# Patient Record
Sex: Male | Born: 1965 | Race: White | Hispanic: No | Marital: Married | Smoking: Former smoker
Health system: Southern US, Community
[De-identification: ages and names within clinical notes are randomized; demographics above are authoritative.]

## PROBLEM LIST (undated history)

## (undated) DIAGNOSIS — B029 Zoster without complications: Secondary | ICD-10-CM

## (undated) DIAGNOSIS — G4700 Insomnia, unspecified: Secondary | ICD-10-CM

## (undated) DIAGNOSIS — Z9989 Dependence on other enabling machines and devices: Secondary | ICD-10-CM

## (undated) DIAGNOSIS — R5383 Other fatigue: Secondary | ICD-10-CM

## (undated) DIAGNOSIS — G4730 Sleep apnea, unspecified: Secondary | ICD-10-CM

## (undated) DIAGNOSIS — R001 Bradycardia, unspecified: Secondary | ICD-10-CM

## (undated) DIAGNOSIS — G709 Myoneural disorder, unspecified: Secondary | ICD-10-CM

## (undated) DIAGNOSIS — E785 Hyperlipidemia, unspecified: Secondary | ICD-10-CM

## (undated) DIAGNOSIS — D696 Thrombocytopenia, unspecified: Secondary | ICD-10-CM

## (undated) DIAGNOSIS — I10 Essential (primary) hypertension: Secondary | ICD-10-CM

## (undated) HISTORY — DX: Thrombocytopenia, unspecified: D69.6

## (undated) HISTORY — DX: Other fatigue: R53.83

## (undated) HISTORY — DX: Myoneural disorder, unspecified: G70.9

## (undated) HISTORY — DX: Sleep apnea, unspecified: G47.30

## (undated) HISTORY — DX: Dependence on other enabling machines and devices: Z99.89

## (undated) HISTORY — DX: Essential (primary) hypertension: I10

## (undated) HISTORY — DX: Zoster without complications: B02.9

## (undated) HISTORY — DX: Hyperlipidemia, unspecified: E78.5

## (undated) HISTORY — DX: Bradycardia, unspecified: R00.1

## (undated) HISTORY — DX: Insomnia, unspecified: G47.00

---

## 2006-01-08 ENCOUNTER — Ambulatory Visit: Payer: Self-pay | Admitting: Family Medicine

## 2007-04-19 DIAGNOSIS — G4700 Insomnia, unspecified: Secondary | ICD-10-CM

## 2007-04-19 HISTORY — DX: Insomnia, unspecified: G47.00

## 2007-04-22 HISTORY — PX: LUMBAR LAMINECTOMY: SHX95

## 2007-10-08 ENCOUNTER — Ambulatory Visit: Payer: Self-pay | Admitting: Chiropractor

## 2008-04-13 ENCOUNTER — Ambulatory Visit: Payer: Self-pay | Admitting: Neurosurgery

## 2008-04-21 ENCOUNTER — Ambulatory Visit (HOSPITAL_COMMUNITY): Admission: RE | Admit: 2008-04-21 | Discharge: 2008-04-22 | Payer: Self-pay | Admitting: Neurosurgery

## 2008-06-23 DIAGNOSIS — R5383 Other fatigue: Secondary | ICD-10-CM

## 2008-06-23 HISTORY — DX: Other fatigue: R53.83

## 2010-07-15 LAB — BASIC METABOLIC PANEL
Calcium: 9.7 mg/dL (ref 8.4–10.5)
Creatinine, Ser: 1.31 mg/dL (ref 0.4–1.5)
GFR calc Af Amer: >60 mL/min (ref >60)
GFR calc non Af Amer: 59 mL/min — ABNORMAL LOW (ref >60)
Glucose, Bld: 85 mg/dL (ref 70–99)
Sodium: 138 mEq/L (ref 135–145)

## 2010-07-15 LAB — CBC
Hemoglobin: 14.9 g/dL (ref 13.0–17.0)
RBC: 4.87 MIL/uL (ref 4.22–5.81)
RDW: 12.6 % (ref 11.5–15.5)

## 2010-08-13 NOTE — Op Note (Signed)
[name], [name]                ACCOUNT NO.:  [id number]   MEDICAL RECORD NO.:  [id number]          PATIENT TYPE:  OIB   LOCATION:  3526                         FACILITY:  [facility]   PHYSICIAN:  Coletta Memos, M.D.     DATE OF BIRTH:  02-09-1966   DATE OF PROCEDURE:  04/21/2008  DATE OF DISCHARGE:                               OPERATIVE REPORT   PREOPERATIVE DIAGNOSES:  1. Displaced disk, L4-5.  2. Right L4 and right L5 radiculopathy.   POSTOPERATIVE DIAGNOSES:  1. Displaced disk, L4-5.  2. Right L4 and right L5 radiculopathy.   PROCEDURE:  Right L4-5 semi-hemilaminectomy and diskectomy with  microdissection.   COMPLICATIONS:  None.   SURGEON:  Coletta Memos, MD   ASSISTANT:  Hewitt Shorts, MD   INDICATIONS:  Clifford Alexander is a 45 year old who has severe pain in his  back, right lower extremity secondary to what is a very large herniated  disk at L4-5.  Slightly centered to the right side, but it does cross  the midline.  After living with this for approximately 6 months, he is  at this point in time opted for operative decompression.  He is admitted  today for said operation.   OPERATIVE NOTE:  Mr. [name] was brought to the operating room intubated  and placed under general anesthetic without difficulty.  He was rolled  prone onto a Wilson frame and all pressure points were properly padded.  His back was prepped and he was draped in sterile fashion.  I  infiltrated 20 mL of 0.5% lidocaine, 1:200,000 strength epinephrine into  the lumbar region.  I opened the skin with a #10 blade and took the  incision down to the thoracolumbar fascia.  I then exposed the lamina,  which I believed to be L4 and L5.  Intraoperative x-ray confirmed that I  was in a correct position.  I then performed the semi-hemilaminectomy at  L4 using a high-speed drill and Kerrison punch.  I removed the  ligamentum flavum and exposed the thecal sac.  I brought the microscope  into the operative field  and proceeded with the diskectomy.   With microdissection and Dr. Earl Gala assistance, I retracted thecal  sac medially.  There was a very large disk herniation, which was caudal  to the disk space and then a very large bridge and opening into the disk  space itself.  I removed the fragment first, then entered the disk space  where the disk was markedly degenerated and came out very easily.  I  tried to remove as much of the loose material as possible.  I then  irrigated copiously into the disk space to make sure again and all loose  material will be identified.  I then irrigated the wound.  I then closed  the wound in  layered fashion after inspecting the nerve root and feeling that there  was good decompression rostrally, caudally, medially, and laterally.  I  closed the wound in layered fashion using Vicryl sutures to  reapproximate thoracolumbar, subcutaneous, and subcuticular layers.  I  used Dermabond for sterile dressing.  ______________________________  Coletta Memos, M.D.     KC/MEDQ  D:  04/21/2008  T:  04/22/2008  Job:  [id number]

## 2010-12-16 ENCOUNTER — Ambulatory Visit: Payer: Self-pay | Admitting: Gastroenterology

## 2010-12-19 LAB — HM COLONOSCOPY

## 2014-03-14 LAB — LIPID PANEL
CHOLESTEROL: 232 mg/dL — AB (ref 0–200)
HDL: 43 mg/dL (ref 35–70)
LDL Cholesterol: 158 mg/dL
Triglycerides: 155 mg/dL (ref 40–160)

## 2014-03-14 LAB — HEMOGLOBIN A1C: Hgb A1c MFr Bld: 5.4 % (ref 4.0–6.0)

## 2014-03-14 LAB — BASIC METABOLIC PANEL: GLUCOSE: 99 mg/dL

## 2014-09-11 ENCOUNTER — Ambulatory Visit: Payer: Self-pay | Admitting: Family Medicine

## 2014-09-16 ENCOUNTER — Ambulatory Visit: Payer: Managed Care, Other (non HMO)

## 2014-09-16 ENCOUNTER — Ambulatory Visit
Admission: EM | Admit: 2014-09-16 | Discharge: 2014-09-16 | Disposition: A | Discharge status: Home / Self Care | Payer: Managed Care, Other (non HMO) | Attending: Internal Medicine | Admitting: Internal Medicine

## 2014-09-16 ENCOUNTER — Encounter: Payer: Self-pay | Admitting: Gynecology

## 2014-09-16 DIAGNOSIS — M25462 Effusion, left knee: Secondary | ICD-10-CM | POA: Insufficient documentation

## 2014-09-16 DIAGNOSIS — I10 Essential (primary) hypertension: Secondary | ICD-10-CM | POA: Insufficient documentation

## 2014-09-16 DIAGNOSIS — L0390 Cellulitis, unspecified: Secondary | ICD-10-CM | POA: Diagnosis not present

## 2014-09-16 DIAGNOSIS — R5383 Other fatigue: Secondary | ICD-10-CM | POA: Diagnosis not present

## 2014-09-16 DIAGNOSIS — L0291 Cutaneous abscess, unspecified: Secondary | ICD-10-CM

## 2014-09-16 DIAGNOSIS — Z87891 Personal history of nicotine dependence: Secondary | ICD-10-CM | POA: Diagnosis not present

## 2014-09-16 DIAGNOSIS — E785 Hyperlipidemia, unspecified: Secondary | ICD-10-CM | POA: Insufficient documentation

## 2014-09-16 DIAGNOSIS — Z79899 Other long term (current) drug therapy: Secondary | ICD-10-CM | POA: Diagnosis not present

## 2014-09-16 DIAGNOSIS — M009 Pyogenic arthritis, unspecified: Secondary | ICD-10-CM | POA: Diagnosis not present

## 2014-09-16 DIAGNOSIS — G4700 Insomnia, unspecified: Secondary | ICD-10-CM | POA: Diagnosis not present

## 2014-09-16 MED ORDER — SULFAMETHOXAZOLE-TRIMETHOPRIM 800-160 MG PO TABS: 1.0000 | ORAL_TABLET | Freq: Two times a day (BID) | ORAL | Status: AC

## 2014-09-16 MED ORDER — MUPIROCIN CALCIUM 2 % EX CREA: 1.0000 "application " | TOPICAL_CREAM | Freq: Three times a day (TID) | CUTANEOUS | Status: DC

## 2014-09-16 NOTE — ED Notes (Signed)
Patient c/o left knee swollen x week ago. Discomfort / bump at knee .

## 2014-09-16 NOTE — ED Provider Notes (Signed)
CSN: [id number]     Arrival date & time 09/16/14  1434 History   First MD Initiated Contact with Patient 09/16/14 1637     Chief Complaint  Patient presents with  . 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 Noted a tender spot on point of left knee about a week ago and considered a bug bite. Has become more swollen and painful- tried to squeeze it multiple to me without any fluid release-now increasing tender. No fever . No malaise Past Medical History  Diagnosis Date  . Hypertension   . Hyperlipidemia   . Insomnia 04/19/2007  . Fatigue 06/23/2008   Past Surgical History  Procedure Laterality Date  . Lumbar laminectomy  04/22/2007    L4-L5   discectomy 03/2008   Family History  Problem Relation Age of Onset  . Hyperthyroidism Mother    History  Substance Use Topics  . Smoking status: Former Smoker -- 1.00 packs/day for 10 years    Quit date: 04/18/1992  . Smokeless tobacco: Not on file  . Alcohol Use: No    Review of Systems  Review of 10 systems negative for acute change except as referenced in HPI  Allergies  Ciprofloxacin  Home Medications   Prior to Admission medications   Medication Sig Start Date End Date Taking? Authorizing Provider  lisinopril-hydrochlorothiazide (PRINZIDE,ZESTORETIC) 20-12.5 MG per tablet Take 1 tablet by mouth daily.   Yes Historical Provider, MD  omega-3 acid ethyl esters (LOVAZA) 1 G capsule Take by mouth 2 (two) times daily.   Yes Historical Provider, MD  mupirocin cream (BACTROBAN) 2 % Apply 1 application topically 3 (three) times daily. 09/16/14   Jan Fireman, PA-C  omeprazole (PRILOSEC) 20 MG capsule Take 20 mg by mouth daily.    Historical Provider, MD  sulfamethoxazole-trimethoprim (BACTRIM DS,SEPTRA DS) 800-160 MG per tablet Take 1 tablet by mouth 2 (two) times daily. 09/16/14 09/26/14  Jan Fireman,  PA-C   BP 114/84 mmHg  Pulse 78  Temp(Src) 98.3 F (36.8 C) (Tympanic)  Ht 6\' 3"  (1.905 m)  Wt 263 lb (119.296 kg)  BMI 32.87 kg/m2  SpO2 99% Physical Exam Constitutional -alert and oriented,well appearing , mild distress: knee pain-skin wound. Afebrile Head-atraumatic Eyes- conjunctiva normal, EOMI ,conjugate gaze Nose- no congestion  Mouth/throat- mucous membranes moist , Neck- supple  CV- regular rate, grossly normal heart sounds,  Resp-no distress, normal respiratory effort GI- ,no distention GU- not examined MSK- left leg with inflammation point of left knee, 2-3 cm central induration and small central wound. Minor drainage reported by patient. 10 cm halo of erythema. Warm to touch . No effusion of joint, Good ROM without pain except skin tenderness. No lower leg findings. Right knee and lower leg WNL No lower extremity tenderness nor edema,no joint effusion, ambulatory Neuro- normal speech and language,  Skin-WNL except HPI as reported Psych-mood and affect grossly normal; speech and behavior grossly normal ED Course  Procedures (including critical care time) Labs Review Labs Reviewed - No data to display  Imaging Review Dg Knee Complete 4 Views Left  09/16/2014   CLINICAL DATA:  One week history of knee swelling.  EXAM: LEFT KNEE - COMPLETE 4+ VIEW  COMPARISON:  None.  FINDINGS: Frontal, lateral, bilateral oblique, and sunrise patellar images obtained. There is no fracture or dislocation. No appreciable joint effusion. There is slight patellofemoral joint space narrowing. Other joint spaces appear normal.  No erosive change.  IMPRESSION: Slight patellofemoral joint space narrowing. No fracture or effusion.   Electronically Signed   By: Lowella Grip III M.D.   On: 09/16/2014 17:03   No evidence of foreign body noted  MDM   1. Abscess and cellulitis     Rx for TMP-SMZ BID x 10 days and Mupirocin was e-scribed--will not drop in system. 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 x-ray results and diagnosis reviewed with patient 2. Rx as per orders; risks, benefits, potential side effects reviewed with patient 3. Recommend supportive treatment with tylenol/ibuprofen; sterile dressings 4. F/u prn if symptoms worsen or don't improve- see for I&D after initiation of antibiotics   Jan Fireman, PA-C 09/17/14 2326

## 2014-09-17 ENCOUNTER — Encounter: Payer: Self-pay | Admitting: Physician Assistant

## 2014-09-19 ENCOUNTER — Ambulatory Visit
Admission: EM | Admit: 2014-09-19 | Discharge: 2014-09-19 | Disposition: A | Discharge status: Home / Self Care | Payer: Managed Care, Other (non HMO) | Attending: Family Medicine | Admitting: Family Medicine

## 2014-09-19 DIAGNOSIS — L03116 Cellulitis of left lower limb: Secondary | ICD-10-CM

## 2014-09-19 MED ORDER — CEFAZOLIN SODIUM 1 G IJ SOLR
1.0000 g | Freq: Once | INTRAMUSCULAR | Status: AC
  Administered 2014-09-19: 1 g via INTRAMUSCULAR

## 2014-09-19 NOTE — ED Provider Notes (Signed)
CSN: [id number]     Arrival date & time 09/19/14  1011 History   First MD Initiated Contact with Patient 09/19/14 1129     Chief Complaint  Patient presents with  . Wound Check   (Consider location/radiation/quality/duration/timing/severity/associated sxs/prior Treatment) HPI Comments: 49 yo male here for follow up on his left anterior knee cellulitis. Was seen here (by different provider) 3 days ago and started on Bactrim DS. Has been applying heating pad as well. States he's doing better and symptoms improving. Denies fevers, chills.  Patient is a 49 y.o. male presenting with wound check. The history is provided by the patient.  Wound Check    Past Medical History  Diagnosis Date  . Hypertension   . Hyperlipidemia   . Insomnia 04/19/2007  . Fatigue 06/23/2008   Past Surgical History  Procedure Laterality Date  . Lumbar laminectomy  04/22/2007    L4-L5   discectomy 03/2008   Family History  Problem Relation Age of Onset  . Hyperthyroidism Mother    History  Substance Use Topics  . Smoking status: Former Smoker -- 1.00 packs/day for 10 years    Quit date: 04/18/1992  . Smokeless tobacco: Not on file  . Alcohol Use: No    Review of Systems  Allergies  Ciprofloxacin  Home Medications   Prior to Admission medications   Medication Sig Start Date End Date Taking? Authorizing Provider  lisinopril-hydrochlorothiazide (PRINZIDE,ZESTORETIC) 20-12.5 MG per tablet Take 1 tablet by mouth daily.    Historical Provider, MD  mupirocin cream (BACTROBAN) 2 % Apply 1 application topically 3 (three) times daily. 09/16/14   Jan Fireman, PA-C  omega-3 acid ethyl esters (LOVAZA) 1 G capsule Take by mouth 2 (two) times daily.    Historical Provider, MD  omeprazole (PRILOSEC) 20 MG capsule Take 20 mg by mouth daily.    Historical Provider, MD  sulfamethoxazole-trimethoprim (BACTRIM DS,SEPTRA DS) 800-160 MG per tablet Take 1 tablet by mouth 2 (two) times daily. 09/16/14 09/26/14  Jan Fireman, PA-C   Temp(Src) 97.3 F (36.3 C) (Tympanic) Physical Exam  Constitutional: He appears well-developed and well-nourished. No distress.  Neurological: He is alert.  Skin: He is not diaphoretic.  Left anterior knee skin with healing puncture wound area and mild surrounding erythema and edema (per patient report this is improved); left lower extremity neurovascularly intact; normal ROM; no drainage  Nursing note and vitals reviewed.   ED Course  Procedures (including critical care time) Labs Review Labs Reviewed - No data to display  Imaging Review No results found.   MDM   1. Cellulitis of knee, left   (improving)  Discharge Medication List as of 09/19/2014 12:30 PM    Plan: 1.  diagnosis reviewed with patient 2. Recommend continue current oral antibiotic 3. Patient given Ancef 1gm IM x 1 in clinic 4. Recommend warm compresses to affected area and elevation  5. F/u prn if symptoms worsen or don't improve    Norval Gable, MD 09/19/14 [phone]

## 2014-09-19 NOTE — ED Notes (Signed)
Here for wound recheck.

## 2014-10-23 ENCOUNTER — Encounter: Payer: Self-pay | Admitting: Family Medicine

## 2014-10-23 ENCOUNTER — Ambulatory Visit (INDEPENDENT_AMBULATORY_CARE_PROVIDER_SITE_OTHER): Payer: Managed Care, Other (non HMO) | Admitting: Family Medicine

## 2014-10-23 VITALS — BP 124/78 | HR 69 | Temp 98.2°F | Resp 16 | Ht 76.5 in | Wt 264.6 lb

## 2014-10-23 DIAGNOSIS — E785 Hyperlipidemia, unspecified: Secondary | ICD-10-CM

## 2014-10-23 DIAGNOSIS — E782 Mixed hyperlipidemia: Secondary | ICD-10-CM | POA: Diagnosis not present

## 2014-10-23 DIAGNOSIS — K219 Gastro-esophageal reflux disease without esophagitis: Secondary | ICD-10-CM | POA: Insufficient documentation

## 2014-10-23 DIAGNOSIS — I10 Essential (primary) hypertension: Secondary | ICD-10-CM

## 2014-10-23 DIAGNOSIS — K645 Perianal venous thrombosis: Secondary | ICD-10-CM

## 2014-10-23 DIAGNOSIS — R519 Headache, unspecified: Secondary | ICD-10-CM | POA: Insufficient documentation

## 2014-10-23 DIAGNOSIS — R739 Hyperglycemia, unspecified: Secondary | ICD-10-CM | POA: Insufficient documentation

## 2014-10-23 DIAGNOSIS — R51 Headache: Secondary | ICD-10-CM

## 2014-10-23 DIAGNOSIS — E8881 Metabolic syndrome: Secondary | ICD-10-CM | POA: Insufficient documentation

## 2014-10-23 DIAGNOSIS — Z8709 Personal history of other diseases of the respiratory system: Secondary | ICD-10-CM | POA: Insufficient documentation

## 2014-10-23 LAB — GLUCOSE, POCT (MANUAL RESULT ENTRY): POC GLUCOSE: 108 mg/dL — AB (ref 70–99)

## 2014-10-23 LAB — POCT GLYCOSYLATED HEMOGLOBIN (HGB A1C): HEMOGLOBIN A1C: 5.2

## 2014-10-23 NOTE — Progress Notes (Signed)
Name: Clifford Alexander   MRN: [id number]    DOB: 14-Oct-1965   Date:10/23/2014       Progress Note  Subjective  Chief Complaint  Chief Complaint  Patient presents with  . Hyperlipidemia  . Hypertension    Hyperlipidemia This is a chronic problem. The current episode started more than 1 year ago. The problem is uncontrolled. Recent lipid tests were reviewed and are high. Exacerbating diseases include obesity. Factors aggravating his hyperlipidemia include thiazides. Pertinent negatives include no chest pain, focal weakness, myalgias or shortness of breath. He is currently on no antihyperlipidemic treatment. The current treatment provides no improvement of lipids. Compliance problems include adherence to diet and adherence to exercise.  Risk factors for coronary artery disease include dyslipidemia, hypertension, male sex, obesity, a sedentary lifestyle and stress.  Hypertension This is a chronic problem. The current episode started more than 1 year ago. The problem is unchanged. The problem is controlled. Pertinent negatives include no blurred vision, chest pain, headaches, neck pain, orthopnea, palpitations or shortness of breath. There are no associated agents to hypertension. Past treatments include ACE inhibitors and diuretics. The current treatment provides moderate improvement. There are no compliance problems.       Past Medical History  Diagnosis Date  . Hypertension   . Hyperlipidemia   . Insomnia 04/19/2007  . Fatigue 06/23/2008    History  Substance Use Topics  . Smoking status: Former Smoker -- 1.00 packs/day for 10 years    Quit date: 04/18/1992  . Smokeless tobacco: Not on file  . Alcohol Use: No     Current outpatient prescriptions:  .  lisinopril-hydrochlorothiazide (PRINZIDE,ZESTORETIC) 20-12.5 MG per tablet, Take 1 tablet by mouth daily., Disp: , Rfl:  .  omega-3 acid ethyl esters (LOVAZA) 1 G capsule, Take by mouth 2 (two) times daily., Disp: , Rfl:  .  omeprazole  (PRILOSEC) 20 MG capsule, Take 20 mg by mouth daily., Disp: , Rfl:   Allergies  Allergen Reactions  . Ciprofloxacin     Review of Systems  Constitutional: Negative for fever, chills and weight loss.  HENT: Negative for congestion, hearing loss, sore throat and tinnitus.   Eyes: Negative for blurred vision, double vision and redness.  Respiratory: Negative for cough, hemoptysis and shortness of breath.   Cardiovascular: Negative for chest pain, palpitations, orthopnea, claudication and leg swelling.  Gastrointestinal: Negative for heartburn, nausea, vomiting, diarrhea, constipation and blood in stool.  Genitourinary: Negative for dysuria, urgency, frequency and hematuria.  Musculoskeletal: Negative for myalgias, back pain, joint pain, falls and neck pain.  Skin: Negative for itching.  Neurological: Negative for dizziness, tingling, tremors, focal weakness, seizures, loss of consciousness, weakness and headaches.  Endo/Heme/Allergies: Does not bruise/bleed easily.  Psychiatric/Behavioral: Negative for depression and substance abuse. The patient is not nervous/anxious and does not have insomnia.      Objective  Filed Vitals:   10/23/14 0759  BP: 124/78  Pulse: 69  Temp: 98.2 F (36.8 C)  Resp: 16  Height: 6' 4.5" (1.943 m)  Weight: 264 lb 9 oz (120.005 kg)  SpO2: 98%     Physical Exam  Constitutional: He is oriented to person, place, and time and well-developed, well-nourished, and in no distress.   obese  HENT:  Head: Normocephalic.  Eyes: EOM are normal. Pupils are equal, round, and reactive to light.  Neck: Normal range of motion. Neck supple. No thyromegaly present.  Cardiovascular: Normal rate, regular rhythm and normal heart sounds.   No murmur heard. Pulmonary/Chest:  Effort normal and breath sounds normal. No respiratory distress. He has no wheezes.  Abdominal: Soft. Bowel sounds are normal.  Musculoskeletal: Normal range of motion. He exhibits edema (trace  edema).  Lymphadenopathy:    He has no cervical adenopathy.  Neurological: He is alert and oriented to person, place, and time. No cranial nerve deficit. Gait normal. Coordination normal.  Skin: Skin is warm and dry. No rash noted.  Psychiatric: Affect and judgment normal.      Assessment & Plan   1. Benign essential HTN Well-controlled  2. External thrombosed hemorrhoids Continue suppositories and creams and sit on a doughnut  3. Combined fat and carbohydrate induced hyperlipemia Given obesity handout check labs today and given a handout on obesity  4. Hyperglycemia  - POCT glycosylated hemoglobin (Hb A1C) - POCT glucose (manual entry)  5. Hyperlipidemia Labs today - Comprehensive metabolic panel - Lipid panel - TSH

## 2014-10-23 NOTE — Patient Instructions (Addendum)

## 2014-10-24 LAB — COMPREHENSIVE METABOLIC PANEL
ALBUMIN: 4.3 g/dL (ref 3.5–5.5)
ALK PHOS: 63 IU/L (ref 39–117)
ALT: 17 IU/L (ref 0–44)
AST: 16 IU/L (ref 0–40)
Albumin/Globulin Ratio: 2 (ref 1.1–2.5)
BILIRUBIN TOTAL: 1 mg/dL (ref 0.0–1.2)
BUN / CREAT RATIO: 10 (ref 9–20)
BUN: 12 mg/dL (ref 6–24)
CHLORIDE: 102 mmol/L (ref 97–108)
CO2: 24 mmol/L (ref 18–29)
CREATININE: 1.25 mg/dL (ref 0.76–1.27)
Calcium: 9.3 mg/dL (ref 8.7–10.2)
GFR calc non Af Amer: 67 mL/min/{1.73_m2} (ref >59)
GFR, EST AFRICAN AMERICAN: 77 mL/min/{1.73_m2} (ref >59)
Globulin, Total: 2.1 g/dL (ref 1.5–4.5)
Glucose: 93 mg/dL (ref 65–99)
POTASSIUM: 4.3 mmol/L (ref 3.5–5.2)
SODIUM: 142 mmol/L (ref 134–144)
TOTAL PROTEIN: 6.4 g/dL (ref 6.0–8.5)

## 2014-10-24 LAB — LIPID PANEL
CHOL/HDL RATIO: 4.7 ratio (ref 0.0–5.0)
Cholesterol, Total: 191 mg/dL (ref 100–199)
HDL: 41 mg/dL (ref >39)
LDL CALC: 126 mg/dL — AB (ref 0–99)
TRIGLYCERIDES: 121 mg/dL (ref 0–149)
VLDL Cholesterol Cal: 24 mg/dL (ref 5–40)

## 2014-10-24 LAB — TSH: TSH: 3.26 u[IU]/mL (ref 0.450–4.500)

## 2015-01-22 ENCOUNTER — Encounter: Payer: Self-pay | Admitting: Family Medicine

## 2015-01-22 ENCOUNTER — Ambulatory Visit (INDEPENDENT_AMBULATORY_CARE_PROVIDER_SITE_OTHER): Payer: Managed Care, Other (non HMO) | Admitting: Family Medicine

## 2015-01-22 VITALS — BP 130/80 | HR 67 | Temp 98.6°F | Resp 18 | Ht 77.0 in | Wt 269.2 lb

## 2015-01-22 DIAGNOSIS — I10 Essential (primary) hypertension: Secondary | ICD-10-CM | POA: Diagnosis not present

## 2015-01-22 DIAGNOSIS — J0101 Acute recurrent maxillary sinusitis: Secondary | ICD-10-CM | POA: Diagnosis not present

## 2015-01-22 DIAGNOSIS — J40 Bronchitis, not specified as acute or chronic: Secondary | ICD-10-CM

## 2015-01-22 MED ORDER — AMOXICILLIN-POT CLAVULANATE 875-125 MG PO TABS: 1.0000 | ORAL_TABLET | Freq: Two times a day (BID) | ORAL | Status: DC

## 2015-01-22 MED ORDER — LISINOPRIL-HYDROCHLOROTHIAZIDE 20-12.5 MG PO TABS: 1.0000 | ORAL_TABLET | Freq: Every day | ORAL | Status: DC

## 2015-01-22 MED ORDER — FLUTICASONE PROPIONATE 50 MCG/ACT NA SUSP: 2.0000 | Freq: Every day | NASAL | Status: DC

## 2015-01-22 MED ORDER — PREDNISONE 20 MG PO TABS: 20.0000 mg | ORAL_TABLET | Freq: Every day | ORAL | Status: DC

## 2015-01-22 NOTE — Progress Notes (Signed)
Name: Clifford Alexander   MRN: [id number]    DOB: [birth year]   Date:01/22/2015       Progress Note  Subjective  Chief Complaint  Chief Complaint  Patient presents with  . Hypertension    3 month follow up  . Hyperlipidemia  . URI    for 6 days    HPI  Hypertension   Patient presents for follow-up of hypertension. It has been present for over 5 years.  Patient states that there is compliance with medical regimen which consists of lisinopril 20 mg once daily . There is no end organ disease. Cardiac risk factors include hypertension hyperlipidemia and diabetes.  Exercise regimen consist of minimal .  Diet consist of some salt restriction .  Hyperlipidemia  Patient has a history of hyperlipidemia for over 5 years.  Current medical regimen consist of omega-3 fatty acids .  Compliance is good .  Diet and exercise are currently followed intermittently .  Risk factors for cardiovascular disease include hyperlipidemia obesity and sedentary lifestyle .   There have been no side effects from the medication.    Sinusitis  Patient presents with greater than 7 day history of nasal congestion and drainage which is purulent in color. There is tenderness over the sinuses. There has been fever to none along with some associated chills on occasion. Usage of over-the-counter medications is not been affected. There is also accompanying cough productive of purulent sputum.  Past Medical History  Diagnosis Date  . Hypertension   . Hyperlipidemia   . Insomnia 04/19/2007  . Fatigue 06/23/2008    Social History  Substance Use Topics  . Smoking status: Former Smoker -- 1.00 packs/day for 10 years    Quit date: 04/18/1992  . Smokeless tobacco: Not on file  . Alcohol Use: No     Current outpatient prescriptions:  .  amoxicillin-clavulanate (AUGMENTIN) 875-125 MG tablet, Take 1 tablet by mouth 2 (two) times daily., Disp: 20 tablet, Rfl: 0 .  fluticasone (FLONASE) 50 MCG/ACT nasal spray, Place 2 sprays  into both nostrils daily., Disp: 16 g, Rfl: 1 .  lisinopril-hydrochlorothiazide (PRINZIDE,ZESTORETIC) 20-12.5 MG tablet, Take 1 tablet by mouth daily., Disp: 30 tablet, Rfl: 5 .  omega-3 acid ethyl esters (LOVAZA) 1 G capsule, Take by mouth 2 (two) times daily., Disp: , Rfl:  .  omeprazole (PRILOSEC) 20 MG capsule, Take 20 mg by mouth daily., Disp: , Rfl:  .  predniSONE (DELTASONE) 20 MG tablet, Take 1 tablet (20 mg total) by mouth daily with breakfast., Disp: 10 tablet, Rfl: 0  Allergies  Allergen Reactions  . Ciprofloxacin     Review of Systems  Constitutional: Positive for malaise/fatigue. Negative for fever, chills and weight loss.  HENT: Positive for congestion (PURULENT NASAL DISCHARGE AND SINUS PRESSURE). Negative for hearing loss, sore throat and tinnitus.   Eyes: Negative for blurred vision, double vision and redness.  Respiratory: Positive for cough and sputum production. Negative for hemoptysis and shortness of breath.   Cardiovascular: Negative for chest pain, palpitations, orthopnea, claudication and leg swelling.  Gastrointestinal: Negative for heartburn, nausea, vomiting, diarrhea, constipation and blood in stool.  Genitourinary: Negative for dysuria, urgency, frequency and hematuria.  Musculoskeletal: Negative for myalgias, back pain, joint pain, falls and neck pain.  Skin: Negative for itching.  Neurological: Negative for dizziness, tingling, tremors, focal weakness, seizures, loss of consciousness, weakness and headaches.  Endo/Heme/Allergies: Does not bruise/bleed easily.  Psychiatric/Behavioral: Negative for depression and substance abuse. The patient is not nervous/anxious and  does not have insomnia.      Objective  Filed Vitals:   01/22/15 1143  BP: 130/80  Pulse: 67  Temp: 98.6 F (37 C)  Resp: 18  Height: 6\' 5"  (1.956 m)  Weight: 269 lb 3.2 oz (122.108 kg)  SpO2: 97%     Physical Exam  Constitutional: He is oriented to person, place, and time.   Obese and in no acute distress  HENT:  Bilateral nasal turbinate swelling with hyperemia and clear to purulent discharge  Eyes: EOM are normal. Pupils are equal, round, and reactive to light.  Neck: Normal range of motion. Neck supple. No thyromegaly present.  Cardiovascular: Normal rate, regular rhythm and normal heart sounds.   No murmur heard. Pulmonary/Chest: Effort normal and breath sounds normal. No respiratory distress. He has no wheezes.  Musculoskeletal: Normal range of motion. He exhibits no edema.  Lymphadenopathy:    He has no cervical adenopathy.  Neurological: He is alert and oriented to person, place, and time. No cranial nerve deficit. Gait normal. Coordination normal.  Skin: Skin is warm and dry. No rash noted.  Psychiatric: Affect and judgment normal.      Assessment & Plan  1. Essential hypertension Well-controlled - Comprehensive Metabolic Panel (CMET)  2. Acute recurrent maxillary sinusitis Augmentin and Flonase OTC  3. Bronchitis Prednisone 20 mg twice a day for 5 days

## 2015-01-23 LAB — COMPREHENSIVE METABOLIC PANEL
ALBUMIN: 4.4 g/dL (ref 3.5–5.5)
ALK PHOS: 73 IU/L (ref 39–117)
ALT: 14 IU/L (ref 0–44)
AST: 15 IU/L (ref 0–40)
Albumin/Globulin Ratio: 1.9 (ref 1.1–2.5)
BUN / CREAT RATIO: 9 (ref 9–20)
BUN: 10 mg/dL (ref 6–24)
Bilirubin Total: 0.8 mg/dL (ref 0.0–1.2)
CO2: 26 mmol/L (ref 18–29)
CREATININE: 1.15 mg/dL (ref 0.76–1.27)
Calcium: 9.6 mg/dL (ref 8.7–10.2)
Chloride: 100 mmol/L (ref 97–106)
GFR, EST AFRICAN AMERICAN: 85 mL/min/{1.73_m2} (ref >59)
GFR, EST NON AFRICAN AMERICAN: 73 mL/min/{1.73_m2} (ref >59)
GLUCOSE: 93 mg/dL (ref 65–99)
Globulin, Total: 2.3 g/dL (ref 1.5–4.5)
Potassium: 4.3 mmol/L (ref 3.5–5.2)
SODIUM: 141 mmol/L (ref 136–144)
TOTAL PROTEIN: 6.7 g/dL (ref 6.0–8.5)

## 2015-01-24 ENCOUNTER — Telehealth: Payer: Self-pay | Admitting: Emergency Medicine

## 2015-01-24 NOTE — Telephone Encounter (Signed)
Left message of lab results

## 2015-05-21 ENCOUNTER — Ambulatory Visit: Payer: Managed Care, Other (non HMO) | Admitting: Family Medicine

## 2015-07-18 ENCOUNTER — Ambulatory Visit (INDEPENDENT_AMBULATORY_CARE_PROVIDER_SITE_OTHER): Payer: Managed Care, Other (non HMO) | Admitting: Family Medicine

## 2015-07-18 VITALS — BP 132/70 | HR 77 | Temp 98.8°F | Resp 18 | Ht 77.0 in | Wt 267.1 lb

## 2015-07-18 DIAGNOSIS — J0101 Acute recurrent maxillary sinusitis: Secondary | ICD-10-CM | POA: Diagnosis not present

## 2015-07-18 DIAGNOSIS — I10 Essential (primary) hypertension: Secondary | ICD-10-CM

## 2015-07-18 DIAGNOSIS — M545 Low back pain, unspecified: Secondary | ICD-10-CM

## 2015-07-18 DIAGNOSIS — J0100 Acute maxillary sinusitis, unspecified: Secondary | ICD-10-CM | POA: Insufficient documentation

## 2015-07-18 MED ORDER — LISINOPRIL-HYDROCHLOROTHIAZIDE 20-12.5 MG PO TABS: 1.0000 | ORAL_TABLET | Freq: Every day | ORAL | Status: DC

## 2015-07-18 MED ORDER — TRAMADOL HCL 50 MG PO TABS: 50.0000 mg | ORAL_TABLET | Freq: Two times a day (BID) | ORAL | Status: DC | PRN

## 2015-07-18 MED ORDER — MOMETASONE FUROATE 50 MCG/ACT NA SUSP: 2.0000 | Freq: Every day | NASAL | Status: DC

## 2015-07-18 MED ORDER — AZITHROMYCIN 250 MG PO TABS: ORAL_TABLET | ORAL | Status: DC

## 2015-07-18 NOTE — Progress Notes (Signed)
Name: Clifford Alexander   MRN: [id number]    DOB: Jan 15, 1966   Date:07/18/2015       Progress Note  Subjective  Chief Complaint  Chief Complaint  Patient presents with  . Pain    lower back and medication refills    Hypertension This is a chronic problem. The problem is unchanged. Pertinent negatives include no blurred vision, chest pain, headaches, palpitations or shortness of breath. Past treatments include diuretics and ACE inhibitors. There is no history of kidney disease, CAD/MI or CVA.  Back Pain This is a new problem. The current episode started more than 1 month ago. The pain is present in the lumbar spine. The pain does not radiate. The pain is at a severity of 3/10. The pain is moderate. 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 Pertinent negatives include no bladder incontinence, bowel incontinence, chest pain, fever, headaches, numbness, pelvic pain or perianal numbness. He has tried nothing for the symptoms.  Sinusitis This is a recurrent problem. The current episode started more than 1 month ago (Intermittent but recurrent, present for 2-3 months, worse in last 2 weeks.). There has been no fever. Associated symptoms include sinus pressure. Pertinent negatives include no chills, headaches, shortness of breath, sore throat or swollen glands. Treatments tried: Flonase, but ran out of it.      Past Medical History  Diagnosis Date  . Hypertension   . Hyperlipidemia   . Insomnia 04/19/2007  . Fatigue 06/23/2008    Past Surgical History  Procedure Laterality Date  . Lumbar laminectomy  04/22/2007    L4-L5   discectomy 03/2008    Family History  Problem Relation Age of Onset  . Hyperthyroidism Mother     Social History   Social History  . Marital Status: Married    Spouse Name: N/A  . Number of Children: N/A  . Years of Education: N/A   Occupational History  . Not  on file.   Social History Main Topics  . Smoking status: Former Smoker -- 1.00 packs/day for 10 years    Quit date: 04/18/1992  . Smokeless tobacco: Not on file  . Alcohol Use: No  . Drug Use: No  . Sexual Activity: Not on file   Other Topics Concern  . Not on file   Social History Narrative     Current outpatient prescriptions:  .  fluticasone (FLONASE) 50 MCG/ACT nasal spray, Place 2 sprays into both nostrils daily., Disp: 16 g, Rfl: 1 .  lisinopril-hydrochlorothiazide (PRINZIDE,ZESTORETIC) 20-12.5 MG tablet, Take 1 tablet by mouth daily., Disp: 30 tablet, Rfl: 5 .  omega-3 acid ethyl esters (LOVAZA) 1 G capsule, Take by mouth 2 (two) times daily., Disp: , Rfl:   Allergies  Allergen Reactions  . Naproxen Shortness Of Breath  . Ciprofloxacin      Review of Systems  Constitutional: Negative for fever and chills.  HENT: Positive for sinus pressure. Negative for sore throat.   Eyes: Negative for blurred vision.  Respiratory: Negative for shortness of breath.   Cardiovascular: Negative for chest pain and palpitations.  Gastrointestinal: Negative for bowel incontinence.  Genitourinary: Negative for bladder incontinence and pelvic pain.  Musculoskeletal: Positive for back pain.  Neurological: Positive for dizziness. Negative for numbness and headaches.    Objective  Filed Vitals:   07/18/15 1600  BP: 132/70  Pulse: 77  Temp: 98.8 F (37.1 C)  Resp:  18  Height: 6\' 5"  (1.956 m)  Weight: 267 lb 2 oz (121.167 kg)  SpO2: 97%    Physical Exam  Constitutional: He is oriented to person, place, and time and well-developed, well-nourished, and in no distress.  HENT:  Head: Normocephalic and atraumatic.  Nose: Right sinus exhibits maxillary sinus tenderness. Left sinus exhibits maxillary sinus tenderness.  Mouth/Throat: Posterior oropharyngeal erythema present.  Nasal turbinate hypertrophy, inflammation  Cardiovascular: Normal rate and regular rhythm.   Pulmonary/Chest:  Effort normal and breath sounds normal.  Abdominal: Soft. Bowel sounds are normal.  Neurological: He is alert and oriented to person, place, and time.  Nursing note and vitals reviewed.    Assessment & Plan  1. Benign essential HTN Blood pressure stable and controlled on present therapy - lisinopril-hydrochlorothiazide (PRINZIDE,ZESTORETIC) 20-12.5 MG tablet; Take 1 tablet by mouth daily.  Dispense: 90 tablet; Refill: 1  2. Acute bilateral low back pain without sciatica Likely chemical, advised to avoid activities that cause back pain. Started on tramadol for 2 weeks. - traMADol (ULTRAM) 50 MG tablet; Take 1 tablet (50 mg total) by mouth every 12 (twelve) hours as needed.  Dispense: 30 tablet; Refill: 0  3. Acute recurrent maxillary sinusitis  - azithromycin (ZITHROMAX) 250 MG tablet; 2 tabs po day 1, then 1 tab po q day x 4 days  Dispense: 6 tablet; Refill: 0 - mometasone (NASONEX) 50 MCG/ACT nasal spray; Place 2 sprays into the nose daily.  Dispense: 17 g; Refill: 2   Sally-Anne Wamble Asad [facility] 07/18/2015 4:07 PM

## 2015-08-30 ENCOUNTER — Encounter: Payer: Managed Care, Other (non HMO) | Admitting: Family Medicine

## 2015-09-05 ENCOUNTER — Encounter: Payer: Self-pay | Admitting: Family Medicine

## 2015-11-19 ENCOUNTER — Encounter: Payer: Self-pay | Admitting: Family Medicine

## 2015-11-19 ENCOUNTER — Ambulatory Visit (INDEPENDENT_AMBULATORY_CARE_PROVIDER_SITE_OTHER): Payer: Managed Care, Other (non HMO) | Admitting: Family Medicine

## 2015-11-19 VITALS — BP 130/84 | HR 62 | Temp 98.1°F | Resp 18 | Ht 77.0 in | Wt 263.8 lb

## 2015-11-19 DIAGNOSIS — R9431 Abnormal electrocardiogram [ECG] [EKG]: Secondary | ICD-10-CM

## 2015-11-19 DIAGNOSIS — Z0000 Encounter for general adult medical examination without abnormal findings: Secondary | ICD-10-CM | POA: Diagnosis not present

## 2015-11-19 DIAGNOSIS — J0101 Acute recurrent maxillary sinusitis: Secondary | ICD-10-CM | POA: Diagnosis not present

## 2015-11-19 LAB — CBC WITH DIFFERENTIAL/PLATELET
BASOS ABS: 0 {cells}/uL (ref 0–200)
Basophils Relative: 0 %
EOS ABS: 110 {cells}/uL (ref 15–500)
Eosinophils Relative: 2 %
HEMATOCRIT: 45.1 % (ref 38.5–50.0)
Hemoglobin: 15.3 g/dL (ref 13.2–17.1)
LYMPHS PCT: 33 %
Lymphs Abs: 1815 cells/uL (ref 850–3900)
MCH: 31 pg (ref 27.0–33.0)
MCHC: 33.9 g/dL (ref 32.0–36.0)
MCV: 91.5 fL (ref 80.0–100.0)
MONOS PCT: 9 %
MPV: 11.1 fL (ref 7.5–12.5)
Monocytes Absolute: 495 cells/uL (ref 200–950)
NEUTROS PCT: 56 %
Neutro Abs: 3080 cells/uL (ref 1500–7800)
PLATELETS: 162 10*3/uL (ref 140–400)
RBC: 4.93 MIL/uL (ref 4.20–5.80)
RDW: 13.6 % (ref 11.0–15.0)
WBC: 5.5 10*3/uL (ref 3.8–10.8)

## 2015-11-19 LAB — COMPLETE METABOLIC PANEL WITH GFR
ALBUMIN: 4.4 g/dL (ref 3.6–5.1)
ALT: 13 U/L (ref 9–46)
AST: 14 U/L (ref 10–35)
Alkaline Phosphatase: 57 U/L (ref 40–115)
BUN: 14 mg/dL (ref 7–25)
CO2: 27 mmol/L (ref 20–31)
Calcium: 9.5 mg/dL (ref 8.6–10.3)
Chloride: 103 mmol/L (ref 98–110)
Creat: 1.3 mg/dL (ref 0.70–1.33)
GFR, Est African American: 73 mL/min (ref >=60)
GFR, Est Non African American: 63 mL/min (ref >=60)
GLUCOSE: 86 mg/dL (ref 65–99)
POTASSIUM: 4.3 mmol/L (ref 3.5–5.3)
SODIUM: 139 mmol/L (ref 135–146)
TOTAL PROTEIN: 6.6 g/dL (ref 6.1–8.1)
Total Bilirubin: 1.2 mg/dL (ref 0.2–1.2)

## 2015-11-19 LAB — LIPID PANEL
Cholesterol: 196 mg/dL (ref 125–200)
HDL: 41 mg/dL (ref >=40)
LDL CALC: 126 mg/dL (ref <130)
Total CHOL/HDL Ratio: 4.8 Ratio (ref <=5.0)
Triglycerides: 147 mg/dL (ref <150)
VLDL: 29 mg/dL (ref <30)

## 2015-11-19 LAB — POCT GLYCOSYLATED HEMOGLOBIN (HGB A1C): HEMOGLOBIN A1C: 5.5

## 2015-11-19 LAB — TSH: TSH: 2.51 m[IU]/L (ref 0.40–4.50)

## 2015-11-19 MED ORDER — MOMETASONE FUROATE 50 MCG/ACT NA SUSP: 2.0000 | Freq: Every day | NASAL | 2 refills | Status: DC

## 2015-11-19 NOTE — Progress Notes (Signed)
Name: Clifford Alexander   MRN: [id number]    DOB: 22-May-1965   Date:11/19/2015       Progress Note  Subjective  Chief Complaint  Chief Complaint  Patient presents with  . Annual Exam    HPI  Pt. Presents for annual physical exam. He is doing well. His last colonoscopy was 4-5 years ago, had Hemorrhoids which were successfully banded.  Last Prostate exam was 2 years ago at the time of his physical.    Past Medical History:  Diagnosis Date  . Fatigue 06/23/2008  . Hyperlipidemia   . Hypertension   . Insomnia 04/19/2007    Past Surgical History:  Procedure Laterality Date  . LUMBAR LAMINECTOMY  04/22/2007   L4-L5   discectomy 03/2008    Family History  Problem Relation Age of Onset  . Hyperthyroidism Mother     Social History   Social History  . Marital status: Married    Spouse name: N/A  . Number of children: N/A  . Years of education: N/A   Occupational History  . Not on file.   Social History Main Topics  . Smoking status: Former Smoker    Packs/day: 1.00    Years: 10.00    Quit date: 04/18/1992  . Smokeless tobacco: Not on file  . Alcohol use No  . Drug use: No  . Sexual activity: Not on file   Other Topics Concern  . Not on file   Social History Narrative  . No narrative on file     Current Outpatient Prescriptions:  .  calcium-vitamin D (OSCAL WITH D) 500-200 MG-UNIT tablet, Take 1 tablet by mouth., Disp: , Rfl:  .  Cinnamon 500 MG capsule, Take 500 mg by mouth daily., Disp: , Rfl:  .  co-enzyme Q-10 30 MG capsule, Take 30 mg by mouth 3 (three) times daily., Disp: , Rfl:  .  LevOCARNitine (L-CARNITINE) 250 MG CAPS, Take by mouth., Disp: , Rfl:  .  lisinopril-hydrochlorothiazide (PRINZIDE,ZESTORETIC) 20-12.5 MG tablet, Take 1 tablet by mouth daily., Disp: 90 tablet, Rfl: 1 .  magnesium oxide (MAG-OX) 400 MG tablet, Take 400 mg by mouth daily., Disp: , Rfl:  .  omega-3 acid ethyl esters (LOVAZA) 1 G capsule, Take by mouth 2 (two) times daily.,  Disp: , Rfl:  .  PREBIOTIC PRODUCT PO, Take by mouth., Disp: , Rfl:  .  Probiotic Product (PROBIOTIC ADVANCED PO), Take by mouth., Disp: , Rfl:   Allergies  Allergen Reactions  . Naproxen Shortness Of Breath  . Ciprofloxacin      Review of Systems  Constitutional: Negative for chills and fever.  HENT: Positive for congestion. Negative for ear pain and sore throat.   Eyes: Negative for blurred vision.  Respiratory: Negative for cough and sputum production.   Cardiovascular: Negative for chest pain and leg swelling.  Gastrointestinal: Negative for constipation, heartburn, nausea and vomiting.  Genitourinary: Negative for dysuria and hematuria.  Musculoskeletal: Negative for back pain and joint pain.  Skin: Negative for rash.  Neurological: Negative for headaches.  Psychiatric/Behavioral: Negative for depression. The patient is not nervous/anxious.      Objective  Vitals:   11/19/15 1117  BP: 130/84  Pulse: 62  Resp: 18  Temp: 98.1 F (36.7 C)  TempSrc: Oral  SpO2: 97%  Weight: 263 lb 12.8 oz (119.7 kg)  Height: 6\' 5"  (1.956 m)    Physical Exam  Constitutional: He is oriented to person, place, and time and well-developed, well-nourished, and in no  distress.  HENT:  Head: Normocephalic and atraumatic.  Mouth/Throat: No posterior oropharyngeal erythema.  Nasal turbinate hypertrophy, mucosal inflammation.  Eyes: Pupils are equal, round, and reactive to light.  Cardiovascular: Normal rate, regular rhythm, S1 normal and S2 normal.   No murmur heard. Pulmonary/Chest: Breath sounds normal. He has no wheezes.  Abdominal: Soft. Bowel sounds are normal. There is no tenderness.  Genitourinary: Rectum normal and prostate normal. Prostate is not enlarged and not tender.  Neurological: He is alert and oriented to person, place, and time.  Skin: Skin is warm and dry.  Psychiatric: Mood, memory, affect and judgment normal.     Assessment & Plan  1. Annual physical exam Age  appropriate screening lab work obtained - CBC with Differential - Lipid Profile - COMPLETE METABOLIC PANEL WITH GFR - TSH - Vitamin D (25 hydroxy) - EKG 12-Lead - POCT HgB A1C  2. Acute recurrent maxillary sinusitis Recurrence of symptoms, no indication for antibiotic therapy, restart on nasal steroid. - mometasone (NASONEX) 50 MCG/ACT nasal spray; Place 2 sprays into the nose daily.  Dispense: 17 g; Refill: 2  3. Abnormal EKG EKG shows sinus bradycardia, referral to cardiology for further assessment. - Ambulatory referral to Cardiology  [facility] 11/19/2015 11:40 AM

## 2015-11-20 LAB — VITAMIN D 25 HYDROXY (VIT D DEFICIENCY, FRACTURES): Vit D, 25-Hydroxy: 28 ng/mL — ABNORMAL LOW (ref 30–100)

## 2015-12-02 ENCOUNTER — Ambulatory Visit
Admission: EM | Admit: 2015-12-02 | Discharge: 2015-12-02 | Disposition: A | Discharge status: Home / Self Care | Payer: Managed Care, Other (non HMO) | Attending: Family Medicine | Admitting: Family Medicine

## 2015-12-02 DIAGNOSIS — L03116 Cellulitis of left lower limb: Secondary | ICD-10-CM | POA: Diagnosis not present

## 2015-12-02 MED ORDER — CEFAZOLIN SODIUM 1 G IJ SOLR
1.0000 g | Freq: Once | INTRAMUSCULAR | Status: AC
  Administered 2015-12-02: 1 g via INTRAMUSCULAR

## 2015-12-02 MED ORDER — SULFAMETHOXAZOLE-TRIMETHOPRIM 800-160 MG PO TABS: 1.0000 | ORAL_TABLET | Freq: Two times a day (BID) | ORAL | 0 refills | Status: DC

## 2015-12-02 NOTE — ED Triage Notes (Signed)
Patient states that he has a boil on his left lower leg that started around 10 days ago. Patient states that he went to [facility] walk-in and they Rx Doxycycline. Patient states that he feels like he is worsening, patient states that he is fatigued. Patient has redness around area with purple induration around spot.

## 2015-12-02 NOTE — ED Provider Notes (Signed)
[facility]    CSN: [id number] Arrival date & time: 12/02/15  1001  First Provider Contact:  First MD Initiated Contact with Patient 12/02/15 1107        History   Chief Complaint Chief Complaint  Patient presents with  . Abscess    HPI Clifford Alexander is a 50 y.o. male.   The history is provided by the patient.  Abscess  Patient states that he has a boil on his left lower leg that started around 10 days ago. Patient states that he went to [facility] clinic walk-in and they Rx Doxycycline. Patient states that he feels like he is worsening, patient states that he is fatigued. Patient has redness around area with purple induration around spot. Denies any fevers, chills, vomiting.   Past Medical History:  Diagnosis Date  . Fatigue 06/23/2008  . Hyperlipidemia   . Hypertension   . Insomnia 04/19/2007    Patient Active Problem List   Diagnosis Date Noted  . Annual physical exam 11/19/2015  . Acute bilateral low back pain without sciatica 07/18/2015  . Sinusitis, acute maxillary 07/18/2015  . History of hay fever 10/23/2014  . Acid reflux 10/23/2014  . Cephalalgia 10/23/2014  . Dysmetabolic syndrome 123456  . Hyperglycemia 10/23/2014  . Hyperlipidemia 10/23/2014  . External thrombosed hemorrhoids 04/06/2008  . 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 Cannot sleep 04/19/2007  . Benign essential HTN 08/05/2006    Past Surgical History:  Procedure Laterality Date  . LUMBAR LAMINECTOMY  04/22/2007   L4-L5   discectomy 03/2008       Home Medications    Prior to Admission medications   Medication Sig Start Date End Date Taking? Authorizing Provider  calcium-vitamin D (OSCAL WITH D) 500-200 MG-UNIT tablet Take 1 tablet by mouth.   Yes Historical Provider, MD  Cinnamon 500 MG capsule Take 500 mg by mouth daily.   Yes Historical Provider, MD  co-enzyme Q-10 30 MG capsule Take 30 mg by mouth 3 (three) times daily.   Yes Historical  Provider, MD  LevOCARNitine (L-CARNITINE) 250 MG CAPS Take by mouth.   Yes Historical Provider, MD  lisinopril-hydrochlorothiazide (PRINZIDE,ZESTORETIC) 20-12.5 MG tablet Take 1 tablet by mouth daily. 07/18/15  Yes Roselee Nova, MD  magnesium oxide (MAG-OX) 400 MG tablet Take 400 mg by mouth daily.   Yes Historical Provider, MD  mometasone (NASONEX) 50 MCG/ACT nasal spray Place 2 sprays into the nose daily. 11/19/15  Yes Roselee Nova, MD  omega-3 acid ethyl esters (LOVAZA) 1 G capsule Take by mouth 2 (two) times daily.   Yes Historical Provider, MD  PREBIOTIC PRODUCT PO Take by mouth.   Yes Historical Provider, MD  Probiotic Product (PROBIOTIC ADVANCED PO) Take by mouth.   Yes Historical Provider, MD  sulfamethoxazole-trimethoprim (BACTRIM DS,SEPTRA DS) 800-160 MG tablet Take 1 tablet by mouth 2 (two) times daily. 12/02/15   Norval Gable, MD    Family History Family History  Problem Relation Age of Onset  . Hyperthyroidism Mother     Social History Social History  Substance Use Topics  . Smoking status: Former Smoker    Packs/day: 1.00    Years: 10.00    Quit date: 04/18/1992  . Smokeless tobacco: Never Used  . Alcohol use No     Allergies   Naproxen and Ciprofloxacin   Review of Systems Review of Systems   Physical Exam Triage Vital Signs ED Triage Vitals  Enc Vitals Group  BP 12/02/15 1032 118/75     Pulse Rate 12/02/15 1032 (!) 59     Resp 12/02/15 1032 16     Temp 12/02/15 1032 97.9 F (36.6 C)     Temp Source 12/02/15 1032 Tympanic     SpO2 12/02/15 1032 100 %     Weight 12/02/15 1033 265 lb (120.2 kg)     Height 12/02/15 1033 6\' 5"  (1.956 m)     Head Circumference --      Peak Flow --      Pain Score 12/02/15 1034 3     Pain Loc --      Pain Edu? --      Excl. in [place]? --    No data found.   Updated Vital Signs BP 118/75 (BP Location: Left Arm)   Pulse (!) 59   Temp 97.9 F (36.6 C) (Tympanic)   Resp 16   Ht 6\' 5"  (1.956 m)   Wt 265 lb  (120.2 kg)   SpO2 100%   BMI 31.42 kg/m   Visual Acuity Right Eye Distance:   Left Eye Distance:   Bilateral Distance:    Right Eye Near:   Left Eye Near:    Bilateral Near:     Physical Exam  Constitutional: He appears well-developed and well-nourished. No distress.  Skin: He is not diaphoretic. There is erythema.     Left lower extremity skin warm, blanchable erythema, tenderness to palpation; 36mm superficial ulceration with mild serous drainage.  Nursing note and vitals reviewed.    UC Treatments / Results  Labs (all labs ordered are listed, but only abnormal results are displayed) Labs Reviewed  AEROBIC CULTURE (SUPERFICIAL SPECIMEN)    EKG  EKG Interpretation None       Radiology No results found.  Procedures Procedures (including critical care time)  Medications Ordered in UC Medications  ceFAZolin (ANCEF) injection 1 g (1 g Intramuscular Given 12/02/15 1135)     Initial Impression / Assessment and Plan / UC Course  I have reviewed the triage vital signs and the nursing notes.  Pertinent labs & imaging results that were available during my care of the patient were reviewed by me and considered in my medical decision making (see chart for details).  Clinical Course     Final Clinical Impressions(s) / UC Diagnoses   Final diagnoses:  Cellulitis of leg, left    New Prescriptions Discharge Medication List as of 12/02/2015 11:45 AM    START taking these medications   Details  sulfamethoxazole-trimethoprim (BACTRIM DS,SEPTRA DS) 800-160 MG tablet Take 1 tablet by mouth 2 (two) times daily., Starting Sun 12/02/2015, Normal       1. diagnosis reviewed with patient; patient given Ancef 1gm IM x1; sample of serous fluid obtained for culture 2. rx as per orders above; reviewed possible side effects, interactions, risks and benefits  3. Recommend supportive treatment with elevation and warm compresses 4. Follow-up prn if symptoms worsen or don't  improve   Norval Gable, MD 12/02/15 1252

## 2015-12-05 LAB — AEROBIC CULTURE  (SUPERFICIAL SPECIMEN)

## 2015-12-05 LAB — AEROBIC CULTURE W GRAM STAIN (SUPERFICIAL SPECIMEN)

## 2015-12-13 ENCOUNTER — Ambulatory Visit: Payer: Managed Care, Other (non HMO) | Admitting: Cardiology

## 2015-12-25 ENCOUNTER — Encounter: Payer: Self-pay | Admitting: *Deleted

## 2016-01-30 DIAGNOSIS — B029 Zoster without complications: Secondary | ICD-10-CM

## 2016-01-30 HISTORY — DX: Zoster without complications: B02.9

## 2016-02-02 ENCOUNTER — Encounter: Payer: Self-pay | Admitting: Gynecology

## 2016-02-02 ENCOUNTER — Ambulatory Visit
Admission: EM | Admit: 2016-02-02 | Discharge: 2016-02-02 | Disposition: A | Discharge status: Home / Self Care | Payer: Managed Care, Other (non HMO) | Attending: Family Medicine | Admitting: Family Medicine

## 2016-02-02 DIAGNOSIS — B029 Zoster without complications: Secondary | ICD-10-CM | POA: Diagnosis not present

## 2016-02-02 MED ORDER — PREDNISONE 10 MG PO TABS: ORAL_TABLET | ORAL | 0 refills | Status: DC

## 2016-02-02 MED ORDER — VALACYCLOVIR HCL 1 G PO TABS: 1000.0000 mg | ORAL_TABLET | Freq: Three times a day (TID) | ORAL | 0 refills | Status: AC

## 2016-02-02 NOTE — ED Provider Notes (Signed)
[facility] ____________________________________________  Time seen: Approximately 1:21 PM  I have reviewed the triage vital signs and the nursing notes.   HISTORY  Chief Complaint Rash   HPI Clifford Alexander is a 50 y.o. male presents for complaints of rash to right chest and the right axilla. Patient reports just prior to rash onset he was having some mild pain in the same area and right upper arm. Patient reports rash is been present for the last 4 days. Reports unresolving over-the-counter calamine lotion. Denies known trigger. Denies numbness or tingling sensation or weakness.States otherwise feels well. Denies similar presentation in the past. Denies others with similar. Denies any changes in foods, medicines, lotions, detergents or other contacts. Patient reports that he did have chickenpox as a child. Patient denies any other complaints. Denies any other rash or skin changes. Denies recent sickness, recent stress or other recent changes.   Past Medical History:  Diagnosis Date  . Fatigue 06/23/2008  . Hyperlipidemia   . Hypertension   . Insomnia 04/19/2007    Patient Active Problem List   Diagnosis Date Noted  . Annual physical exam 11/19/2015  . Acute bilateral low back pain without sciatica 07/18/2015  . Sinusitis, acute maxillary 07/18/2015  . History of hay fever 10/23/2014  . Acid reflux 10/23/2014  . Cephalalgia 10/23/2014  . Dysmetabolic syndrome 123456  . Hyperglycemia 10/23/2014  . Hyperlipidemia 10/23/2014  . External thrombosed hemorrhoids 04/06/2008  . 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 Cannot sleep 04/19/2007  . Benign essential HTN 08/05/2006    Past Surgical History:  Procedure Laterality Date  . LUMBAR LAMINECTOMY  04/22/2007   L4-L5   discectomy 03/2008    Current Outpatient Rx  . Order #: [id number] Class: Historical Med  . Order #: [id number] Class: Historical Med  . Order #: [id number] Class: Historical  Med  . Order #: [id number] Class: Historical Med  . Order #: [id number] Class: Print  . Order #: [id number] Class: Historical Med  . Order #: [id number] Class: Print  . Order #: [id number] Class: Historical Med  . Order #: [id number] Class: Historical Med  . Order #: [id number] Class: Historical Med  . Order #: [id number] Class: Normal  . Order #: [id number] Class: Normal  . Order #: [id number] Class: Normal    No current facility-administered medications for this encounter.   Current Outpatient Prescriptions:  .  calcium-vitamin D (OSCAL WITH D) 500-200 MG-UNIT tablet, Take 1 tablet by mouth., Disp: , Rfl:  .  Cinnamon 500 MG capsule, Take 500 mg by mouth daily., Disp: , Rfl:  .  co-enzyme Q-10 30 MG capsule, Take 30 mg by mouth 3 (three) times daily., Disp: , Rfl:  .  LevOCARNitine (L-CARNITINE) 250 MG CAPS, Take by mouth., Disp: , Rfl:  .  lisinopril-hydrochlorothiazide (PRINZIDE,ZESTORETIC) 20-12.5 MG tablet, Take 1 tablet by mouth daily., Disp: 90 tablet, Rfl: 1 .  magnesium oxide (MAG-OX) 400 MG tablet, Take 400 mg by mouth daily., Disp: , Rfl:  .  mometasone (NASONEX) 50 MCG/ACT nasal spray, Place 2 sprays into the nose daily., Disp: 17 g, Rfl: 2 .  omega-3 acid ethyl esters (LOVAZA) 1 G capsule, Take by mouth 2 (two) times daily., Disp: , Rfl:  .  PREBIOTIC PRODUCT PO, Take by mouth., Disp: , Rfl:  .  Probiotic Product (PROBIOTIC ADVANCED PO), Take by mouth., Disp: , Rfl:  .  sulfamethoxazole-trimethoprim (BACTRIM DS,SEPTRA DS) 800-160 MG tablet, Take 1 tablet by mouth 2 (two) times daily., Disp: 20 tablet, Rfl: 0 .  predniSONE (DELTASONE) 10 MG  tablet, Start 60 mg po day one, then 50 mg po day two, taper by 10 mg daily until complete., Disp: 21 tablet, Rfl: 0 .  valACYclovir (VALTREX) 1000 MG tablet, Take 1 tablet (1,000 mg total) by mouth 3 (three) times daily., Disp: 21 tablet, Rfl: 0  Allergies Naproxen and Ciprofloxacin  Family History  Problem Relation Age of Onset  . Hyperthyroidism Mother      Social History Social History  Substance Use Topics  . Smoking status: Former Smoker    Packs/day: 1.00    Years: 10.00    Quit date: 04/18/1992  . Smokeless tobacco: Never Used  . Alcohol use No    Review of Systems Constitutional: No fever/chills Eyes: No visual changes. ENT: No sore throat. Cardiovascular: Denies chest pain. Respiratory: Denies shortness of breath. Gastrointestinal: No abdominal pain.  No nausea, no vomiting.  No diarrhea.  No constipation. Genitourinary: Negative for dysuria. Musculoskeletal: Negative for back pain. Skin: Positive for rash. Neurological: Negative for headaches, focal weakness or numbness.  10-point ROS otherwise negative.  ____________________________________________   PHYSICAL EXAM:  VITAL SIGNS: ED Triage Vitals  Enc Vitals Group     BP 02/02/16 1253 126/79     Pulse Rate 02/02/16 1253 62     Resp 02/02/16 1253 16     Temp 02/02/16 1253 97.9 F (36.6 C)     Temp Source 02/02/16 1253 Oral     SpO2 02/02/16 1253 100 %     Weight 02/02/16 1254 265 lb (120.2 kg)     Height 02/02/16 1254 6\' 4"  (1.93 m)     Head Circumference --      Peak Flow --      Pain Score 02/02/16 1256 7     Pain Loc --      Pain Edu? --      Excl. in [place]? --     Constitutional: Alert and oriented. Well appearing and in no acute distress. Eyes: Conjunctivae are normal. PERRL. EOMI. ENT      Head: Normocephalic and atraumatic.      Nose: No congestion/rhinnorhea.      Mouth/Throat: Mucous membranes are moist.Oropharynx non-erythematous. Neck: No stridor. Supple without meningismus.  Hematological/Lymphatic/Immunilogical: No cervical lymphadenopathy. Cardiovascular: Normal rate, regular rhythm. Grossly normal heart sounds.  Good peripheral circulation. Respiratory: Normal respiratory effort without tachypnea nor retractions. Breath sounds are clear and equal bilaterally. No wheezes/rales/rhonchi.. Gastrointestinal: Soft and nontender. No  distention. Normal Bowel sounds. No CVA tenderness. Musculoskeletal:  Nontender with normal range of motion in all extremities. No midline cervical, thoracic or lumbar tenderness to palpation. Bilateral pedal pulses equal and easily palpated.      Right lower leg:  No tenderness or edema.      Left lower leg:  No tenderness or edema.  Neurologic:  Normal speech and language. No gross focal neurologic deficits are appreciated. Speech is normal. No gait instability.  Skin:  Skin is warm, dry and intact. No rash noted. Except: Right anterior chest as well as 3 areas to right axilla clustered vesicular rash minimally erythematous base, no surrounding erythema no drainage, no induration, no fluctuance, no other rash noted, rash nontender. Rash does not cross midline. Bilateral upper and lower extremities with full range of motion, normal sensation and normal strength.   Psychiatric: Mood and affect are normal. Speech and behavior are normal. Patient exhibits appropriate insight and judgment   ___________________________________________   LABS (all labs ordered are listed, but only abnormal results are displayed)  Labs Reviewed - No data to display ____________________________________________  PROCEDURES Procedures    INITIAL IMPRESSION / ASSESSMENT AND PLAN / ED COURSE  Pertinent labs & imaging results that were available during my care of the patient were reviewed by me and considered in my medical decision making (see chart for details).  Well-appearing patient. No acute distress. Rash clinical appearance consistent with shingles rash. 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 Patient reports that he will continue to take Tylenol over-the-counter as needed as earlier in the week that helped. Will treat patient with oral valacyclovir as well as  prednisone. Encourage monitoring. Discussed indication, risks and benefits of medications with patient.  Discussed follow up with Primary care physician this week. Discussed follow up and return parameters including no resolution or any worsening concerns. Patient verbalized understanding and agreed to plan.   ____________________________________________   FINAL CLINICAL IMPRESSION(S) / ED DIAGNOSES  Final diagnoses:  Herpes zoster without complication     Discharge Medication List as of 02/02/2016  1:40 PM    START taking these medications   Details  predniSONE (DELTASONE) 10 MG tablet Start 60 mg po day one, then 50 mg po day two, taper by 10 mg daily until complete., Normal    valACYclovir (VALTREX) 1000 MG tablet Take 1 tablet (1,000 mg total) by mouth 3 (three) times daily., Starting Sat 02/02/2016, Until Sat 02/09/2016, Normal        Note: This dictation was prepared with Dragon dictation along with smaller phrase technology. Any transcriptional errors that result from this process are unintentional.    Clinical Course      Marylene Land, NP 02/02/16 1537

## 2016-02-02 NOTE — ED Triage Notes (Signed)
Per patient rash on chest / rotates to back.

## 2016-02-02 NOTE — Discharge Instructions (Signed)
Take medication as prescribed. Rest. Drink plenty of fluids.  ° °Follow up with your primary care physician this week as needed. Return to Urgent care for new or worsening concerns.  ° °

## 2016-02-08 ENCOUNTER — Ambulatory Visit: Payer: Managed Care, Other (non HMO) | Admitting: Cardiovascular Disease

## 2016-03-28 ENCOUNTER — Encounter: Payer: Self-pay | Admitting: Cardiology

## 2016-03-28 ENCOUNTER — Other Ambulatory Visit: Payer: Self-pay | Admitting: Family Medicine

## 2016-03-28 ENCOUNTER — Ambulatory Visit (INDEPENDENT_AMBULATORY_CARE_PROVIDER_SITE_OTHER): Payer: Managed Care, Other (non HMO) | Admitting: Cardiology

## 2016-03-28 VITALS — BP 120/86 | HR 58 | Ht 76.0 in | Wt 272.2 lb

## 2016-03-28 DIAGNOSIS — I10 Essential (primary) hypertension: Secondary | ICD-10-CM

## 2016-03-28 DIAGNOSIS — R0602 Shortness of breath: Secondary | ICD-10-CM | POA: Diagnosis not present

## 2016-03-28 DIAGNOSIS — R0789 Other chest pain: Secondary | ICD-10-CM

## 2016-03-28 DIAGNOSIS — R001 Bradycardia, unspecified: Secondary | ICD-10-CM

## 2016-03-28 NOTE — Progress Notes (Signed)
Cardiology Office Note   Date:  03/28/2016   ID:  Alexander, Clifford 05-12-1965, MRN [id number]  Referring Doctor:  Ashok Norris, MD Dr. Manuella Ghazi  Cardiologist:   Wende Bushy, MD   Reason for consultation:  Chief Complaint  Patient presents with  . other    New Patient. Referred by Dr.Shah for abnormal ekg. Reviewed meds with pt verbally.      History of Present Illness: Clifford Alexander is a 50 y.o. male who presents for finding of abn EKG as referred by Dr. Manuella Ghazi - sinus bradycardia  Pt complains of SOB, chest discomfort -- ongoing for several months, mildly progressive, mild chest discomfort, non radiating, lasting minutes at a time, during exertion, resolves with rest. No LOC. Fatigue noted and described as energy levels dwindle as the day progresses - not severe.   ROS:  Please see the history of present illness. Aside from mentioned under HPI, all other systems are reviewed and negative.     Past Medical History:  Diagnosis Date  . Bradycardia   . Fatigue 06/23/2008  . Hyperlipidemia   . Hypertension   . Insomnia 04/19/2007  . Shingles 01/2016    Past Surgical History:  Procedure Laterality Date  . LUMBAR LAMINECTOMY  04/22/2007   L4-L5   discectomy 03/2008     reports that he quit smoking about 23 years ago. He has a 10.00 pack-year smoking history. He has never used smokeless tobacco. He reports that he does not drink alcohol or use drugs.   family history includes Hypertension in his father; Hyperthyroidism in his mother.   Outpatient Medications Prior to Visit  Medication Sig Dispense Refill  . calcium-vitamin D (OSCAL WITH D) 500-200 MG-UNIT tablet Take 1 tablet by mouth.    . Cinnamon 500 MG capsule Take 500 mg by mouth daily.    Marland Kitchen co-enzyme Q-10 30 MG capsule Take 30 mg by mouth 3 (three) times daily.    . LevOCARNitine (L-CARNITINE) 250 MG CAPS Take by mouth.    Marland Kitchen lisinopril-hydrochlorothiazide (PRINZIDE,ZESTORETIC) 20-12.5 MG tablet Take 1 tablet  by mouth daily. 90 tablet 1  . magnesium oxide (MAG-OX) 400 MG tablet Take 400 mg by mouth daily.    . mometasone (NASONEX) 50 MCG/ACT nasal spray Place 2 sprays into the nose daily. 17 g 2  . omega-3 acid ethyl esters (LOVAZA) 1 G capsule Take by mouth 2 (two) times daily.    Marland Kitchen PREBIOTIC PRODUCT PO Take by mouth.    . predniSONE (DELTASONE) 10 MG tablet Start 60 mg po day one, then 50 mg po day two, taper by 10 mg daily until complete. 21 tablet 0  . Probiotic Product (PROBIOTIC ADVANCED PO) Take by mouth.    . sulfamethoxazole-trimethoprim (BACTRIM DS,SEPTRA DS) 800-160 MG tablet Take 1 tablet by mouth 2 (two) times daily. 20 tablet 0   No facility-administered medications prior to visit.      Allergies: Naproxen and Ciprofloxacin    PHYSICAL EXAM: VS:  BP 120/86 (BP Location: Right Arm, Patient Position: Sitting, Cuff Size: Large)   Pulse (!) 58   Ht 6\' 4"  (1.93 m)   Wt 272 lb 4 oz (123.5 kg)   BMI 33.14 kg/m  , Body mass index is 33.14 kg/m. Wt Readings from Last 3 Encounters:  03/28/16 272 lb 4 oz (123.5 kg)  02/02/16 265 lb (120.2 kg)  12/02/15 265 lb (120.2 kg)    GENERAL:  well developed, well nourished, obese, not in acute  distress HEENT: normocephalic, pink conjunctivae, anicteric sclerae, no xanthelasma, normal dentition, oropharynx clear NECK:  no neck vein engorgement, JVP normal, no hepatojugular reflux, carotid upstroke brisk and symmetric, no bruit, no thyromegaly, no lymphadenopathy LUNGS:  good respiratory effort, clear to auscultation bilaterally CV:  PMI not displaced, no thrills, no lifts, S1 and S2 within normal limits, no palpable S3 or S4, no murmurs, no rubs, no gallops ABD:  Soft, nontender, nondistended, normoactive bowel sounds, no abdominal aortic bruit, no hepatomegaly, no splenomegaly MS: nontender back, no kyphosis, no scoliosis, no joint deformities EXT:  2+ DP/PT pulses, no edema, no varicosities, no cyanosis, no clubbing SKIN: warm,  nondiaphoretic, normal turgor, no ulcers NEUROPSYCH: alert, oriented to person, place, and time, sensory/motor grossly intact, normal mood, appropriate affect  Recent Labs: 11/19/2015: ALT 13; BUN 14; Creat 1.30; Hemoglobin 15.3; Platelets 162; Potassium 4.3; Sodium 139; TSH 2.51   Lipid Panel    Component Value Date/Time   CHOL 196 11/19/2015 1351   CHOL 191 10/23/2014 0912   TRIG 147 11/19/2015 1351   HDL 41 11/19/2015 1351   HDL 41 10/23/2014 0912   CHOLHDL 4.8 11/19/2015 1351   VLDL 29 11/19/2015 1351   LDLCALC 126 11/19/2015 1351   LDLCALC 126 (H) 10/23/2014 0912     Other studies Reviewed:  EKG:  The ekg from 12/29/12017 was personally reviewed by me and it revealed SB 58 bpm.  Additional studies/ records that were reviewed personally reviewed by me today include: none available.  ASSESSMENT AND PLAN:  SOB CP/chest discomfort Fatigue Bradycardia, sinus - HR 58 today  Rec stress echo for further eval  --- will assess for chronotropic competence as well.   HTN  BP is well controlled. Continue monitoring BP. Continue current medical therapy and lifestyle changes.  Obesity Body mass index is 33.14 kg/m.Marland Kitchen Recommend aggressive weight loss through diet and increased physical activity. Once stress test done.   Current medicines are reviewed at length with the patient today.  The patient does not have concerns regarding medicines.  Labs/ tests ordered today include:  Orders Placed This Encounter  Procedures  . EKG 12-Lead  . ECHOCARDIOGRAM STRESS TEST    I had a lengthy and detailed discussion with the patient regarding diagnoses, prognosis, diagnostic options, treatment options , and side effects of medications.   I counseled the patient on importance of lifestyle modification including heart healthy diet, regular physical activity once cardiac workup completed.   Disposition:   FU with undersigned after tests  Signed, Wende Bushy, MD  03/28/2016 9:30 AM     [place]  This note was generated in part with voice recognition software and I apologize for any typographical errors that were not detected and corrected.

## 2016-03-28 NOTE — Patient Instructions (Addendum)
Testing/Procedures: Your physician has requested that you have a stress echocardiogram. For further information please visit [email or web address]. Please follow instruction sheet as given.   Do not drink or eat foods with caffeine for 24 hours before the test. (Chocolate, coffee, tea, or energy drinks)  If you use an inhaler, bring it with you to the test.  Do not smoke for 4 hours before the test.  Wear comfortable shoes and clothing.  Follow-Up: Your physician recommends that you schedule a follow-up appointment as needed with Dr. Yvone Neu. We will call you with results and if needed schedule follow up at that time.   It was a pleasure seeing you today here in the office. Please do not hesitate to give [place] a call back if you have any further questions. [place], BSN     Exercise Stress Echocardiogram An exercise stress echocardiogram is a test to check how well your heart is working. This test uses sound waves (ultrasound) and a computer to make images of your heart before and after exercise. Ultrasound images that are taken before you exercise (your resting echocardiogram) will show how much blood is getting to your heart muscle and how well your heart muscle and heart valves are functioning. During the next part of this test, you will walk on a treadmill or ride a stationary bike to see how exercise affects your heart. While you exercise, the electrical activity of your heart will be monitored with an electrocardiogram (ECG). Your blood pressure will also be monitored. You may have this test if you:  Have chest pain or other symptoms of a heart problem.  Recently had a heart attack or heart surgery.  Have heart valve problems.  Have a condition that causes narrowing of the blood vessels that supply your heart (coronary artery disease).  Have a high risk of heart disease and are starting a new exercise program.  Have a high risk of heart disease and need to have  major surgery. Tell a health care provider about:  Any allergies you have.  All medicines you are taking, including vitamins, herbs, eye drops, creams, and over-the-counter medicines.  Any problems you or family members have had with anesthetic medicines.  Any blood disorders you have.  Any surgeries you have had.  Any medical conditions you have.  Whether you are pregnant or may be pregnant. What are the risks? Generally, this is a safe procedure. However, problems may occur, including:  Chest pain.  Dizziness or light-headedness.  Shortness of breath.  Increased or irregular heartbeat (palpitations).  Nausea or vomiting.  Heart attack (very rare). What happens before the procedure?  Follow instructions from your health care provider about eating or drinking restrictions. You may be asked to avoid all forms of caffeine for 24 hours before your procedure, or as told by your health care provider.  Ask your health care provider about changing or stopping your regular medicines. This is especially important if you are taking diabetes medicines or blood thinners.  If you use an inhaler, bring it with you to the test.  Wear loose, comfortable clothing and walking shoes.  Do notuse any products that contain nicotine or tobacco, such as cigarettes and e-cigarettes, for 4 hours before the test or as told by your health care provider. If you need help quitting, ask your health care provider. What happens during the procedure?  You will take off your clothes from the waist up and put on a hospital gown.  A  technician will place electrodes on your chest.  A blood pressure cuff will be placed on your arm.  You will lie down on a table for an ultrasound exam before you exercise. Gel will be rubbed on your chest, and a handheld device (transducer) will be pressed against your chest and moved over your heart.  Then, you will start exercising by walking on a treadmill or pedaling  a stationary bicycle.  Your blood pressure and heart rhythm will be monitored while you exercise.  The exercise will gradually get harder or faster.  You will exercise until:  Your heart reaches a target level.  You are too tired to continue.  You cannot continue because of chest pain, weakness, or dizziness.  You will have another ultrasound exam after you stop exercising. The procedure may vary among health care providers and hospitals. What happens after the procedure?  Your heart rate and blood pressure will be monitored until they return to your normal levels. Summary  An exercise stress echocardiogram is a test that uses ultrasound to check how well your heart works before and after exercise.  Before the test, follow instructions from your health care provider about stopping medications, avoiding nicotine and tobacco, and avoiding certain foods and drinks.  During the test, your blood pressure and heart rhythm will be monitored while you exercise on a treadmill or stationary bicycle. This information is not intended to replace advice given to you by your health care provider. Make sure you discuss any questions you have with your health care provider. Document Released: 03/21/2004 Document Revised: 11/07/2015 Document Reviewed: 11/07/2015 Elsevier Interactive Patient Education  2017 [employer].

## 2016-04-14 ENCOUNTER — Other Ambulatory Visit: Payer: Managed Care, Other (non HMO)

## 2016-04-25 ENCOUNTER — Ambulatory Visit (INDEPENDENT_AMBULATORY_CARE_PROVIDER_SITE_OTHER): Payer: Managed Care, Other (non HMO)

## 2016-04-25 DIAGNOSIS — R0602 Shortness of breath: Secondary | ICD-10-CM

## 2016-04-25 DIAGNOSIS — R0789 Other chest pain: Secondary | ICD-10-CM | POA: Diagnosis not present

## 2016-04-25 DIAGNOSIS — R001 Bradycardia, unspecified: Secondary | ICD-10-CM

## 2016-04-25 LAB — ECHOCARDIOGRAM STRESS TEST
CHL CUP MPHR: 168 {beats}/min
CHL CUP RESTING HR STRESS: 67 {beats}/min
CSEPED: 11 min
CSEPEW: 13.4 METS
CSEPPHR: 176 {beats}/min
Exercise duration (sec): 1 s
Percent HR: 104 %

## 2016-05-18 ENCOUNTER — Encounter: Payer: Self-pay | Admitting: Emergency Medicine

## 2016-05-18 ENCOUNTER — Ambulatory Visit
Admission: EM | Admit: 2016-05-18 | Discharge: 2016-05-18 | Disposition: A | Discharge status: Home / Self Care | Payer: Managed Care, Other (non HMO) | Attending: Family Medicine | Admitting: Family Medicine

## 2016-05-18 DIAGNOSIS — B9789 Other viral agents as the cause of diseases classified elsewhere: Secondary | ICD-10-CM

## 2016-05-18 DIAGNOSIS — J069 Acute upper respiratory infection, unspecified: Secondary | ICD-10-CM | POA: Diagnosis not present

## 2016-05-18 NOTE — ED Provider Notes (Signed)
[facility]    CSN: [id number] Arrival date & time: 05/18/16  1035     History   Chief Complaint Chief Complaint  Patient presents with  . Cough    HPI Clifford Alexander is a 51 y.o. male.    URI  Presenting symptoms: congestion, cough and rhinorrhea   Presenting symptoms: no ear pain, no facial pain, no fever and no sore throat   Severity:  Moderate Onset quality:  Sudden Duration:  2 days Timing:  Constant Progression:  Unchanged Chronicity:  New Relieved by:  None tried Ineffective treatments:  None tried Associated symptoms: no headaches, no myalgias and no wheezing   Risk factors: not elderly, no chronic cardiac disease, no chronic kidney disease, no chronic respiratory disease, no diabetes mellitus, no immunosuppression, no recent illness, no recent travel and no sick contacts     Past Medical History:  Diagnosis Date  . Bradycardia   . Fatigue 06/23/2008  . Hyperlipidemia   . Hypertension   . Insomnia 04/19/2007  . Shingles 01/2016    Patient Active Problem List   Diagnosis Date Noted  . Annual physical exam 11/19/2015  . Acute bilateral low back pain without sciatica 07/18/2015  . Sinusitis, acute maxillary 07/18/2015  . History of hay fever 10/23/2014  . Acid reflux 10/23/2014  . Cephalalgia 10/23/2014  . Dysmetabolic syndrome [id number]  . Hyperglycemia 10/23/2014  . Hyperlipidemia 10/23/2014  . External thrombosed hemorrhoids 04/06/2008  . 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 Cannot sleep 04/19/2007  . Benign essential HTN 08/05/2006    Past Surgical History:  Procedure Laterality Date  . LUMBAR LAMINECTOMY  04/22/2007   L4-L5   discectomy 03/2008       Home Medications    Prior to Admission medications   Medication Sig Start Date End Date Taking? Authorizing Provider  calcium-vitamin D (OSCAL WITH D) 500-200 MG-UNIT tablet Take 1 tablet by mouth.    Historical Provider, MD  Cinnamon 500 MG  capsule Take 500 mg by mouth daily.    Historical Provider, MD  co-enzyme Q-10 30 MG capsule Take 30 mg by mouth 3 (three) times daily.    Historical Provider, MD  LevOCARNitine (L-CARNITINE) 250 MG CAPS Take by mouth.    Historical Provider, MD  lisinopril-hydrochlorothiazide (PRINZIDE,ZESTORETIC) 20-12.5 MG tablet TAKE ONE TABLET BY MOUTH ONCE DAILY 03/28/16   Roselee Nova, MD  magnesium oxide (MAG-OX) 400 MG tablet Take 400 mg by mouth daily.    Historical Provider, MD  mometasone (NASONEX) 50 MCG/ACT nasal spray Place 2 sprays into the nose daily. 11/19/15   Roselee Nova, MD  omega-3 acid ethyl esters (LOVAZA) 1 G capsule Take by mouth 2 (two) times daily.    Historical Provider, MD  PREBIOTIC PRODUCT PO Take by mouth.    Historical Provider, MD  Probiotic Product (PROBIOTIC ADVANCED PO) Take by mouth.    Historical Provider, MD  Pseudoeph-Doxylamine-DM-APAP (NYQUIL PO) Take by mouth.    Historical Provider, MD  Turmeric 500 MG CAPS Take 500 mg by mouth 2 (two) times daily.    Historical Provider, MD    Family History Family History  Problem Relation Age of Onset  . Hyperthyroidism Mother   . Hypertension Father     Social History Social History  Substance Use Topics  . Smoking status: Former Smoker    Packs/day: 1.00    Years: 10.00    Quit date: 04/18/1992  . Smokeless tobacco: Never Used  Comment: quit in 1994  . Alcohol use No     Allergies   Naproxen and Ciprofloxacin   Review of Systems Review of Systems  Constitutional: Negative for fever.  HENT: Positive for congestion and rhinorrhea. Negative for ear pain and sore throat.   Respiratory: Positive for cough. Negative for wheezing.   Musculoskeletal: Negative for myalgias.  Neurological: Negative for headaches.     Physical Exam Triage Vital Signs ED Triage Vitals  Enc Vitals Group     BP 05/18/16 1157 134/87     Pulse Rate 05/18/16 1157 73     Resp 05/18/16 1157 16     Temp 05/18/16 1157 98.1  F (36.7 C)     Temp Source 05/18/16 1157 Oral     SpO2 05/18/16 1157 100 %     Weight 05/18/16 1155 268 lb (121.6 kg)     Height 05/18/16 1155 6\' 4"  (1.93 m)     Head Circumference --      Peak Flow --      Pain Score 05/18/16 1156 0     Pain Loc --      Pain Edu? --      Excl. in [place]? --    No data found.   Updated Vital Signs BP 134/87 (BP Location: Left Arm)   Pulse 73   Temp 98.1 F (36.7 C) (Oral)   Resp 16   Ht 6\' 4"  (1.93 m)   Wt 268 lb (121.6 kg)   SpO2 100%   BMI 32.62 kg/m   Visual Acuity Right Eye Distance:   Left Eye Distance:   Bilateral Distance:    Right Eye Near:   Left Eye Near:    Bilateral Near:     Physical Exam  Constitutional: He appears well-developed and well-nourished. No distress.  HENT:  Head: Normocephalic and atraumatic.  Right Ear: Tympanic membrane, external ear and ear canal normal.  Left Ear: Tympanic membrane, external ear and ear canal normal.  Nose: Nose normal.  Mouth/Throat: Uvula is midline, oropharynx is clear and moist and mucous membranes are normal. No oropharyngeal exudate or tonsillar abscesses.  Eyes: Conjunctivae and EOM are normal. Pupils are equal, round, and reactive to light. Right eye exhibits no discharge. Left eye exhibits no discharge. No scleral icterus.  Neck: Normal range of motion. Neck supple. No tracheal deviation present. No thyromegaly present.  Cardiovascular: Normal rate, regular rhythm and normal heart sounds.   Pulmonary/Chest: Effort normal and breath sounds normal. No stridor. No respiratory distress. He has no wheezes. He has no rales. He exhibits no tenderness.  Lymphadenopathy:    He has no cervical adenopathy.  Neurological: He is alert.  Skin: Skin is warm and dry. No rash noted. He is not diaphoretic.  Nursing note and vitals reviewed.    UC Treatments / Results  Labs (all labs ordered are listed, but only abnormal results are displayed) Labs Reviewed - No data to display  EKG   EKG Interpretation None       Radiology No results found.  Procedures Procedures (including critical care time)  Medications Ordered in UC Medications - No data to display   Initial Impression / Assessment and Plan / UC Course  I have reviewed the triage vital signs and the nursing notes.  Pertinent labs & imaging results that were available during my care of the patient were reviewed by me and considered in my medical decision making (see chart for details).  Final Clinical Impressions(s) / UC Diagnoses   Final diagnoses:  Viral URI with cough    New Prescriptions New Prescriptions   No medications on file   1. diagnosis reviewed with patient 2. Recommend supportive treatment with increased fluids, otc cold/cough otc medications 3. Follow-up prn if symptoms worsen or don't improve   Norval Gable, MD 05/18/16 1249

## 2016-05-18 NOTE — ED Triage Notes (Signed)
Patient c/o cough, runny nose, and bodyaches that started 2 days ago.  Patient denies fevers.

## 2016-08-08 ENCOUNTER — Ambulatory Visit: Payer: Managed Care, Other (non HMO) | Admitting: Internal Medicine

## 2016-10-27 ENCOUNTER — Encounter: Payer: Self-pay | Admitting: Internal Medicine

## 2016-10-27 ENCOUNTER — Ambulatory Visit (INDEPENDENT_AMBULATORY_CARE_PROVIDER_SITE_OTHER): Payer: Managed Care, Other (non HMO) | Admitting: Internal Medicine

## 2016-10-27 VITALS — BP 130/78 | HR 60 | Temp 98.6°F | Resp 12 | Ht 76.0 in | Wt 257.8 lb

## 2016-10-27 DIAGNOSIS — E559 Vitamin D deficiency, unspecified: Secondary | ICD-10-CM

## 2016-10-27 DIAGNOSIS — I10 Essential (primary) hypertension: Secondary | ICD-10-CM | POA: Diagnosis not present

## 2016-10-27 DIAGNOSIS — R200 Anesthesia of skin: Secondary | ICD-10-CM | POA: Diagnosis not present

## 2016-10-27 DIAGNOSIS — K219 Gastro-esophageal reflux disease without esophagitis: Secondary | ICD-10-CM

## 2016-10-27 DIAGNOSIS — E785 Hyperlipidemia, unspecified: Secondary | ICD-10-CM

## 2016-10-27 DIAGNOSIS — M545 Low back pain, unspecified: Secondary | ICD-10-CM

## 2016-10-27 DIAGNOSIS — Z125 Encounter for screening for malignant neoplasm of prostate: Secondary | ICD-10-CM

## 2016-10-27 DIAGNOSIS — L989 Disorder of the skin and subcutaneous tissue, unspecified: Secondary | ICD-10-CM

## 2016-10-27 LAB — CBC WITH DIFFERENTIAL/PLATELET
BASOS PCT: 0.2 % (ref 0.0–3.0)
Basophils Absolute: 0 10*3/uL (ref 0.0–0.1)
EOS ABS: 0.1 10*3/uL (ref 0.0–0.7)
EOS PCT: 1.2 % (ref 0.0–5.0)
HEMATOCRIT: 46.4 % (ref 39.0–52.0)
HEMOGLOBIN: 15.7 g/dL (ref 13.0–17.0)
LYMPHS PCT: 22.9 % (ref 12.0–46.0)
Lymphs Abs: 1.6 10*3/uL (ref 0.7–4.0)
MCHC: 34 g/dL (ref 30.0–36.0)
MCV: 93.5 fl (ref 78.0–100.0)
Monocytes Absolute: 0.6 10*3/uL (ref 0.1–1.0)
Monocytes Relative: 8.1 % (ref 3.0–12.0)
NEUTROS ABS: 4.9 10*3/uL (ref 1.4–7.7)
Neutrophils Relative %: 67.6 % (ref 43.0–77.0)
PLATELETS: 153 10*3/uL (ref 150.0–400.0)
RBC: 4.96 Mil/uL (ref 4.22–5.81)
RDW: 13.4 % (ref 11.5–15.5)
WBC: 7.2 10*3/uL (ref 4.0–10.5)

## 2016-10-27 LAB — COMPREHENSIVE METABOLIC PANEL
ALBUMIN: 4.4 g/dL (ref 3.5–5.2)
ALK PHOS: 50 U/L (ref 39–117)
ALT: 12 U/L (ref 0–53)
AST: 14 U/L (ref 0–37)
BILIRUBIN TOTAL: 1 mg/dL (ref 0.2–1.2)
BUN: 12 mg/dL (ref 6–23)
CALCIUM: 9.6 mg/dL (ref 8.4–10.5)
CO2: 30 mEq/L (ref 19–32)
Chloride: 106 mEq/L (ref 96–112)
Creatinine, Ser: 1.24 mg/dL (ref 0.40–1.50)
GFR: 64.85 mL/min (ref >60.00)
GLUCOSE: 96 mg/dL (ref 70–99)
Potassium: 4.6 mEq/L (ref 3.5–5.1)
Sodium: 142 mEq/L (ref 135–145)
Total Protein: 6.6 g/dL (ref 6.0–8.3)

## 2016-10-27 LAB — VITAMIN B12: Vitamin B-12: 858 pg/mL (ref 211–911)

## 2016-10-27 LAB — LIPID PANEL
CHOL/HDL RATIO: 4
Cholesterol: 177 mg/dL (ref 0–200)
HDL: 42.9 mg/dL (ref >39.00)
LDL Cholesterol: 116 mg/dL — ABNORMAL HIGH (ref 0–99)
NonHDL: 134.01
TRIGLYCERIDES: 91 mg/dL (ref 0.0–149.0)
VLDL: 18.2 mg/dL (ref 0.0–40.0)

## 2016-10-27 LAB — PSA: PSA: 0.61 ng/mL (ref 0.10–4.00)

## 2016-10-27 LAB — SEDIMENTATION RATE: Sed Rate: 1 mm/hr (ref 0–20)

## 2016-10-27 LAB — TSH: TSH: 1.82 u[IU]/mL (ref 0.35–4.50)

## 2016-10-27 NOTE — Progress Notes (Signed)
Patient ID: Clifford Alexander, male   DOB: 1966-02-03, 51 y.o.   MRN: [id number]   Subjective:    Patient ID: Clifford Alexander, male    DOB: 1965/04/21, 51 y.o.   MRN: [id number]  HPI  Patient here to establish care.  He has been followed by Dr Rutherford Nail previously.  Having some allergies.  Discussed taking allegra on a regular basis now.  Discussed using nasal spray as directed.  No chest pain.  No sob.  Had cardiac w/up in 02/2016.  Negative stress test.  Some acid reflux.  No abdominal pain.  Bowels moving.  Cyst - left thumb.  No pain.  No change in size.  Increased stress.  Drives a truck.  Overall he feels he is handling things relatively well.  Has high blood pressure.  Diagnosed 2008.  Only takes his medication 1-2x/week.  On no medications for his cholesterol.  S/p back surgery - 2010.  Still some flares at times.  Also reports some numbness in his toes and the balls of his feet.  Has been seeing Dr Birdie Sons.  Goes in 1x/month for an adjustment.     Past Medical History:  Diagnosis Date  . Bradycardia   . Fatigue 06/23/2008  . Hyperlipidemia   . Hypertension   . Insomnia 04/19/2007  . Shingles 01/2016   Past Surgical History:  Procedure Laterality Date  . LUMBAR LAMINECTOMY  04/22/2007   L4-L5   discectomy 03/2008   Family History  Problem Relation Age of Onset  . Hyperthyroidism Mother   . Hypertension Father    Social History   Social History  . Marital status: Married    Spouse name: N/A  . Number of children: N/A  . Years of education: N/A   Social History Main Topics  . Smoking status: Former Smoker    Packs/day: 1.00    Years: 10.00    Quit date: 04/18/1992  . Smokeless tobacco: Never Used     Comment: quit in 1994  . Alcohol use No  . Drug use: No  . Sexual activity: Not Asked   Other Topics Concern  . None   Social History Narrative  . None    Outpatient Encounter Prescriptions as of 10/27/2016  Medication Sig  . b complex vitamins tablet Take 1 tablet by  mouth daily.  . cholecalciferol (VITAMIN D) 400 units TABS tablet Take 400 Units by mouth.  . Cinnamon 500 MG capsule Take 500 mg by mouth daily.  Marland Kitchen co-enzyme Q-10 30 MG capsule Take 30 mg by mouth 3 (three) times daily.  Marland Kitchen lisinopril-hydrochlorothiazide (PRINZIDE,ZESTORETIC) 20-12.5 MG tablet TAKE ONE TABLET BY MOUTH ONCE DAILY  . magnesium oxide (MAG-OX) 400 MG tablet Take 400 mg by mouth daily.  . mometasone (NASONEX) 50 MCG/ACT nasal spray Place 2 sprays into the nose daily.  Marland Kitchen omega-3 acid ethyl esters (LOVAZA) 1 G capsule Take by mouth 2 (two) times daily.  Marland Kitchen PREBIOTIC PRODUCT PO Take by mouth.  . saw palmetto 160 MG capsule Take 160 mg by mouth 2 (two) times daily.  . Turmeric 500 MG CAPS Take 500 mg by mouth 2 (two) times daily.  . [DISCONTINUED] calcium-vitamin D (OSCAL WITH D) 500-200 MG-UNIT tablet Take 1 tablet by mouth.  . [DISCONTINUED] LevOCARNitine (L-CARNITINE) 250 MG CAPS Take by mouth.  . [DISCONTINUED] Probiotic Product (PROBIOTIC ADVANCED PO) Take by mouth.  . [DISCONTINUED] Pseudoeph-Doxylamine-DM-APAP (NYQUIL PO) Take by mouth.   No facility-administered encounter medications on file as of 10/27/2016.  Review of Systems  Constitutional: Negative for appetite change and unexpected weight change.  HENT: Positive for congestion and postnasal drip. Negative for sinus pressure.   Respiratory: Negative for cough, chest tightness and shortness of breath.   Cardiovascular: Negative for chest pain, palpitations and leg swelling.  Gastrointestinal: Negative for abdominal pain, diarrhea, nausea and vomiting.  Genitourinary: Negative for difficulty urinating and dysuria.  Musculoskeletal: Positive for back pain. Negative for joint swelling and myalgias.  Skin: Negative for color change and rash.  Neurological: Negative for dizziness and light-headedness.       Some occasional headaches.  Not severe.  No constant.  Only notices when at the end of week - when has not slept  regularly.    Hematological: Negative for adenopathy. Does not bruise/bleed easily.  Psychiatric/Behavioral: Negative for agitation and dysphoric mood.       Objective:    Physical Exam  Constitutional: He appears well-developed and well-nourished. No distress.  HENT:  Nose: Nose normal.  Mouth/Throat: Oropharynx is clear and moist.  Eyes: Conjunctivae are normal. Right eye exhibits no discharge. Left eye exhibits no discharge.  Neck: Neck supple. No thyromegaly present.  Cardiovascular: Normal rate and regular rhythm.   Pulmonary/Chest: Effort normal and breath sounds normal. No respiratory distress.  Abdominal: Soft. Bowel sounds are normal. There is no tenderness.  Musculoskeletal: He exhibits no edema or tenderness.  Feet:  Intact to pin prick and light touch.  No lesion.   Lymphadenopathy:    He has no cervical adenopathy.  Skin: No rash noted. No erythema.  Psychiatric: He has a normal mood and affect. His behavior is normal.    BP 130/78   Pulse 60   Temp 98.6 F (37 C) (Oral)   Resp 12   Ht 6\' 4"  (1.93 m)   Wt 257 lb 12.8 oz (116.9 kg)   SpO2 97%   BMI 31.38 kg/m  Wt Readings from Last 3 Encounters:  10/27/16 257 lb 12.8 oz (116.9 kg)  05/18/16 268 lb (121.6 kg)  03/28/16 272 lb 4 oz (123.5 kg)     Lab Results  Component Value Date   WBC 7.2 10/27/2016   HGB 15.7 10/27/2016   HCT 46.4 10/27/2016   PLT 153.0 10/27/2016   GLUCOSE 96 10/27/2016   CHOL 177 10/27/2016   TRIG 91.0 10/27/2016   HDL 42.90 10/27/2016   LDLCALC 116 (H) 10/27/2016   ALT 12 10/27/2016   AST 14 10/27/2016   NA 142 10/27/2016   K 4.6 10/27/2016   CL 106 10/27/2016   CREATININE 1.24 10/27/2016   BUN 12 10/27/2016   CO2 30 10/27/2016   TSH 1.82 10/27/2016   PSA 0.61 10/27/2016   HGBA1C 5.5 11/19/2015       Assessment & Plan:   Problem List Items Addressed This Visit    Acute bilateral low back pain without sciatica    S/p back surgery.  Pain flares intermittently.   Follow.        Benign essential HTN    Blood pressure on recheck improved.  Only taking his medication 1-2x/week.  Follow pressures.  Follow metabolic panel.        Relevant Orders   CBC with Differential/Platelet (Completed)   TSH (Completed)   GERD (gastroesophageal reflux disease)    Start zantac. May be aggravating allergies.        Hyperlipidemia - Primary    Low cholesterol diet and exercise.  Follow lipid panel.  Relevant Orders   Comprehensive metabolic panel (Completed)   Lipid panel (Completed)   Numbness in feet    Numbness as outlined.  Exam as outlined.  Check B12 and routine labs.        Relevant Orders   Vitamin B12 (Completed)   Sedimentation rate (Completed)   Vitamin D deficiency    Follow vitamin d level.        Other Visit Diagnoses    Prostate cancer screening       Relevant Orders   PSA (Completed)   Lesion of finger       appears to be c/w cyst.  non tender.  no increase in size.  discussed further evaluation.  wants to monitor.         Einar Pheasant, MD

## 2016-10-27 NOTE — Assessment & Plan Note (Signed)
Numbness as outlined.  Exam as outlined.  Check B12 and routine labs.

## 2016-10-27 NOTE — Assessment & Plan Note (Signed)
Low cholesterol diet and exercise.  Follow lipid panel.   

## 2016-10-27 NOTE — Progress Notes (Signed)
Pre-visit discussion using our clinic review tool. No additional management support is needed unless otherwise documented below in the visit note.  

## 2016-10-27 NOTE — Patient Instructions (Signed)
Restart allegra daily  nasonex - 2 sprays each nostril one time per day.  Do this in the evening.    Saline nasal spray - flush nose at least 2-3x/day  Zantac (ranitidine) 150mg  - take one tablet 30 minutes before breakfast.

## 2016-10-27 NOTE — Assessment & Plan Note (Signed)
S/p back surgery.  Pain flares intermittently.  Follow.

## 2016-10-27 NOTE — Assessment & Plan Note (Signed)
Blood pressure on recheck improved.  Only taking his medication 1-2x/week.  Follow pressures.  Follow metabolic panel.

## 2016-10-27 NOTE — Assessment & Plan Note (Signed)
Start zantac. May be aggravating allergies.

## 2016-10-27 NOTE — Assessment & Plan Note (Signed)
Follow vitamin d level.   

## 2016-11-20 ENCOUNTER — Telehealth: Payer: Self-pay | Admitting: Internal Medicine

## 2016-11-20 DIAGNOSIS — R200 Anesthesia of skin: Secondary | ICD-10-CM

## 2016-11-20 NOTE — Telephone Encounter (Signed)
Pt called about needing a referral to see the neurologist. Please and thank you!  Call pt @ [phone].

## 2016-11-20 NOTE — Telephone Encounter (Signed)
Patient called. He would like referral for nerve conduction. He would like done at [facility]. Also wanted to know If you would be able to take his wife as new patient. Patient informed you are out of office until next week.Clifford Alexander

## 2016-11-21 NOTE — Telephone Encounter (Signed)
Patient informed. 

## 2016-11-21 NOTE — Telephone Encounter (Signed)
I have placed the order for the referral.  Someone will contact him with appt date and time.  Ok to take his wife as pt.

## 2017-02-02 ENCOUNTER — Ambulatory Visit (INDEPENDENT_AMBULATORY_CARE_PROVIDER_SITE_OTHER): Payer: Managed Care, Other (non HMO) | Admitting: Internal Medicine

## 2017-02-02 ENCOUNTER — Encounter: Payer: Self-pay | Admitting: Internal Medicine

## 2017-02-02 VITALS — BP 129/74 | HR 76 | Temp 98.6°F | Resp 14 | Ht 76.0 in | Wt 262.8 lb

## 2017-02-02 DIAGNOSIS — L989 Disorder of the skin and subcutaneous tissue, unspecified: Secondary | ICD-10-CM

## 2017-02-02 DIAGNOSIS — G5603 Carpal tunnel syndrome, bilateral upper limbs: Secondary | ICD-10-CM | POA: Diagnosis not present

## 2017-02-02 DIAGNOSIS — J0101 Acute recurrent maxillary sinusitis: Secondary | ICD-10-CM

## 2017-02-02 DIAGNOSIS — E559 Vitamin D deficiency, unspecified: Secondary | ICD-10-CM

## 2017-02-02 DIAGNOSIS — E785 Hyperlipidemia, unspecified: Secondary | ICD-10-CM | POA: Diagnosis not present

## 2017-02-02 DIAGNOSIS — R200 Anesthesia of skin: Secondary | ICD-10-CM | POA: Diagnosis not present

## 2017-02-02 DIAGNOSIS — Z0000 Encounter for general adult medical examination without abnormal findings: Secondary | ICD-10-CM | POA: Diagnosis not present

## 2017-02-02 DIAGNOSIS — Z8709 Personal history of other diseases of the respiratory system: Secondary | ICD-10-CM | POA: Diagnosis not present

## 2017-02-02 DIAGNOSIS — R739 Hyperglycemia, unspecified: Secondary | ICD-10-CM

## 2017-02-02 DIAGNOSIS — R5383 Other fatigue: Secondary | ICD-10-CM | POA: Diagnosis not present

## 2017-02-02 DIAGNOSIS — I10 Essential (primary) hypertension: Secondary | ICD-10-CM | POA: Diagnosis not present

## 2017-02-02 MED ORDER — MOMETASONE FUROATE 50 MCG/ACT NA SUSP: 2.0000 | Freq: Every day | NASAL | 2 refills | Status: DC

## 2017-02-02 NOTE — Progress Notes (Signed)
Patient ID: Clifford Alexander, male   DOB: 31-Oct-1965, 51 y.o.   MRN: [id number]   Subjective:    Patient ID: Clifford Alexander, male    DOB: 06-19-1965, 51 y.o.   MRN: [id number]  HPI  Patient here for his physical exam.  States he is doing relatively well.  He was recently evaluated by neurology for numbness and tingling in bilateral hands and feet.  Felt to be likely peripheral neuropathy and bilateral CTS. On B12 supplements for B12 deficiency.  Had NCS that did reveal bilateral mild CTS.  Wearing splints.  Has f/u with neurology soon.  Tries to stay active.  No chest pain.  No sob.  No acid reflux.  No abdominal pain.  Bowels moving.  Request to have testosterone level checked.  Some fatigue.  Also request dermatology referral for skin check and wants a place on abdomen evaluated.     Past Medical History:  Diagnosis Date  . Bradycardia   . Fatigue 06/23/2008  . Hyperlipidemia   . Hypertension   . Insomnia 04/19/2007  . Shingles 01/2016   Past Surgical History:  Procedure Laterality Date  . LUMBAR LAMINECTOMY  04/22/2007   L4-L5   discectomy 03/2008   Family History  Problem Relation Age of Onset  . Hyperthyroidism Mother   . Hypertension Father    Social History   Socioeconomic History  . Marital status: Married    Spouse name: None  . Number of children: None  . Years of education: None  . Highest education level: None  Social Needs  . Financial resource strain: None  . Food insecurity - worry: None  . Food insecurity - inability: None  . Transportation needs - medical: None  . Transportation needs - non-medical: None  Occupational History  . None  Tobacco Use  . Smoking status: Former Smoker    Packs/day: 1.00    Years: 10.00    Pack years: 10.00    Last attempt to quit: 04/18/1992    Years since quitting: 24.8  . Smokeless tobacco: Never Used  . Tobacco comment: quit in 1994  Substance and Sexual Activity  . Alcohol use: No    Alcohol/week: 0.0 oz  . Drug use:  No  . Sexual activity: None  Other Topics Concern  . None  Social History Narrative  . None    Outpatient Encounter Medications as of 02/02/2017  Medication Sig  . b complex vitamins tablet Take 1 tablet by mouth daily.  . cholecalciferol (VITAMIN D) 400 units TABS tablet Take 400 Units by mouth.  . Cinnamon 500 MG capsule Take 500 mg by mouth daily.  Marland Kitchen co-enzyme Q-10 30 MG capsule Take 30 mg by mouth 3 (three) times daily.  Marland Kitchen lisinopril-hydrochlorothiazide (PRINZIDE,ZESTORETIC) 20-12.5 MG tablet TAKE ONE TABLET BY MOUTH ONCE DAILY  . magnesium oxide (MAG-OX) 400 MG tablet Take 400 mg by mouth daily.  . mometasone (NASONEX) 50 MCG/ACT nasal spray Place 2 sprays daily into the nose.  . omega-3 acid ethyl esters (LOVAZA) 1 G capsule Take by mouth 2 (two) times daily.  Marland Kitchen PREBIOTIC PRODUCT PO Take by mouth.  . saw palmetto 160 MG capsule Take 160 mg by mouth 2 (two) times daily.  . Turmeric 500 MG CAPS Take 500 mg by mouth 2 (two) times daily.  . [DISCONTINUED] mometasone (NASONEX) 50 MCG/ACT nasal spray Place 2 sprays into the nose daily.   No facility-administered encounter medications on file as of 02/02/2017.     Review  of Systems  Constitutional: Negative for appetite change and unexpected weight change.  HENT: Negative for congestion and sinus pressure.   Eyes: Negative for pain and visual disturbance.  Respiratory: Negative for cough, chest tightness and shortness of breath.   Cardiovascular: Negative for chest pain, palpitations and leg swelling.  Gastrointestinal: Negative for abdominal pain, diarrhea, nausea and vomiting.  Genitourinary: Negative for difficulty urinating and dysuria.  Musculoskeletal: Negative for back pain and joint swelling.  Skin: Negative for color change and rash.  Neurological: Negative for dizziness, light-headedness and headaches.       Bilateral hand tingling.    Hematological: Negative for adenopathy. Does not bruise/bleed easily.    Psychiatric/Behavioral: Negative for agitation and dysphoric mood.       Objective:     Blood pressure rechecked by me:  120/78  Physical Exam  Constitutional: He is oriented to person, place, and time. He appears well-developed and well-nourished. No distress.  HENT:  Head: Normocephalic and atraumatic.  Nose: Nose normal.  Mouth/Throat: Oropharynx is clear and moist. No oropharyngeal exudate.  Eyes: Conjunctivae are normal. Right eye exhibits no discharge. Left eye exhibits no discharge.  Neck: Neck supple. No thyromegaly present.  Cardiovascular: Normal rate and regular rhythm.  Pulmonary/Chest: Breath sounds normal. No respiratory distress. He has no wheezes.  Abdominal: Soft. Bowel sounds are normal. There is no tenderness.  Genitourinary:  Genitourinary Comments: Rectal exam:  No palpable prostate nodules.  Heme negative.    Musculoskeletal: He exhibits no edema or tenderness.  Lymphadenopathy:    He has no cervical adenopathy.  Neurological: He is alert and oriented to person, place, and time.  Skin: Skin is warm and dry. No rash noted. No erythema.  Psychiatric: He has a normal mood and affect. His behavior is normal.    BP 129/74   Pulse 76   Temp 98.6 F (37 C) (Oral)   Resp 14   Ht _0  (1.93 m)   Wt 262 lb 12.8 oz (119.2 kg)   SpO2 98%   BMI 31.99 kg/m  Wt Readings from Last 3 Encounters:  02/02/17 262 lb 12.8 oz (119.2 kg)  10/27/16 257 lb 12.8 oz (116.9 kg)  05/18/16 268 lb (121.6 kg)     Lab Results  Component Value Date   WBC 7.2 10/27/2016   HGB 15.7 10/27/2016   HCT 46.4 10/27/2016   PLT 153.0 10/27/2016   GLUCOSE 96 10/27/2016   CHOL 177 10/27/2016   TRIG 91.0 10/27/2016   HDL 42.90 10/27/2016   LDLCALC 116 (H) 10/27/2016   ALT 12 10/27/2016   AST 14 10/27/2016   NA 142 10/27/2016   K 4.6 10/27/2016   CL 106 10/27/2016   CREATININE 1.24 10/27/2016   BUN 12 10/27/2016   CO2 30 10/27/2016   TSH 1.82 10/27/2016   PSA 0.61 10/27/2016    HGBA1C 5.5 11/19/2015       Assessment & Plan:   Problem List Items Addressed This Visit    Benign essential HTN    Blood pressure under good control.  Taking his medication on a more regular basis.  Continue same medication regimen.  Follow pressures.  Follow metabolic panel.        Relevant Orders   Basic Metabolic Panel (BMET)   CTS (carpal tunnel syndrome)    Had NCS as outlined.  Wearing splints.  Has f/u planned with neurology.        Health care maintenance    Physical today 02/02/17. PSA  10/27/16 - .61.  States had colonoscopy with Dr Rutherford Nail.  Need results.        History of hay fever   Hyperglycemia    Low carb diet and exercise.  Follow met b and a1c.        Hyperlipidemia    Low cholesterol diet and exercise.  Follow lipid panel.        Numbness in feet    Saw neurology.  Recommended continuing B12 supplements.  Also recommended alpha lipoic acid.  Has f/u soon.        Sinusitis, acute maxillary   Relevant Medications   mometasone (NASONEX) 50 MCG/ACT nasal spray   Vitamin D deficiency    Follow vitamin D level.         Other Visit Diagnoses    Routine general medical examination at a health care facility    -  Primary   Skin lesion       Relevant Orders   Ambulatory referral to Dermatology   Other fatigue       Relevant Orders   Testosterone       Einar Pheasant, MD

## 2017-02-02 NOTE — Patient Instructions (Signed)
Plain robitussin (or mucinex) daily as needed.    Saline nasal spray - flush nose at lease 2x/day  nasonex nasal spray 2 sprays each nostril one time per day.  Do this in the evening.

## 2017-02-03 ENCOUNTER — Telehealth: Payer: Self-pay | Admitting: *Deleted

## 2017-02-03 NOTE — Telephone Encounter (Signed)
Ok

## 2017-02-03 NOTE — Telephone Encounter (Signed)
Copied from [name] [id number]. Topic: General - Other >> Feb 03, 2017 11:44 AM Hewitt Shorts wrote:   Reason for CRM: Clifford Alexander DOB 07/01/2065 is wanting to know if Dr. Nicki Reaper would consider taking him on as a new patient   Best number [phone]

## 2017-02-03 NOTE — Telephone Encounter (Signed)
Are you ok with seeing wife as new patient?

## 2017-02-03 NOTE — Telephone Encounter (Signed)
Message should have been to seen wife Manuela Schwartz

## 2017-02-03 NOTE — Telephone Encounter (Signed)
Please advise 

## 2017-02-03 NOTE — Telephone Encounter (Signed)
I am already seeing this pt.

## 2017-02-05 ENCOUNTER — Encounter: Payer: Self-pay | Admitting: Internal Medicine

## 2017-02-05 DIAGNOSIS — G5600 Carpal tunnel syndrome, unspecified upper limb: Secondary | ICD-10-CM | POA: Insufficient documentation

## 2017-02-05 NOTE — Telephone Encounter (Signed)
Lm on vm for pt to call back and est care with Dr.Scott.

## 2017-02-05 NOTE — Assessment & Plan Note (Signed)
Blood pressure under good control.  Taking his medication on a more regular basis.  Continue same medication regimen.  Follow pressures.  Follow metabolic panel.

## 2017-02-05 NOTE — Assessment & Plan Note (Signed)
Low carb diet and exercise.  Follow met b and a1c.   

## 2017-02-05 NOTE — Assessment & Plan Note (Signed)
Had NCS as outlined.  Wearing splints.  Has f/u planned with neurology.

## 2017-02-05 NOTE — Assessment & Plan Note (Signed)
Saw neurology.  Recommended continuing B12 supplements.  Also recommended alpha lipoic acid.  Has f/u soon.

## 2017-02-05 NOTE — Telephone Encounter (Signed)
Can you call to make new patient app

## 2017-02-05 NOTE — Assessment & Plan Note (Signed)
Follow vitamin D level.  

## 2017-02-05 NOTE — Assessment & Plan Note (Signed)
Low cholesterol diet and exercise.  Follow lipid panel.   

## 2017-02-05 NOTE — Assessment & Plan Note (Signed)
Physical today 02/02/17. PSA 10/27/16 - .61.  States had colonoscopy with Dr Rutherford Nail.  Need results.

## 2017-02-16 ENCOUNTER — Other Ambulatory Visit (INDEPENDENT_AMBULATORY_CARE_PROVIDER_SITE_OTHER): Payer: Managed Care, Other (non HMO)

## 2017-02-16 DIAGNOSIS — I10 Essential (primary) hypertension: Secondary | ICD-10-CM | POA: Diagnosis not present

## 2017-02-16 DIAGNOSIS — R5383 Other fatigue: Secondary | ICD-10-CM

## 2017-02-16 LAB — BASIC METABOLIC PANEL
BUN: 13 mg/dL (ref 6–23)
CALCIUM: 9.8 mg/dL (ref 8.4–10.5)
CHLORIDE: 103 meq/L (ref 96–112)
CO2: 30 meq/L (ref 19–32)
Creatinine, Ser: 1.2 mg/dL (ref 0.40–1.50)
GFR: 67.27 mL/min (ref >60.00)
GLUCOSE: 103 mg/dL — AB (ref 70–99)
POTASSIUM: 4.1 meq/L (ref 3.5–5.1)
SODIUM: 140 meq/L (ref 135–145)

## 2017-02-16 LAB — TESTOSTERONE: TESTOSTERONE: 423.44 ng/dL (ref 300.00–890.00)

## 2017-02-17 ENCOUNTER — Encounter: Payer: Self-pay | Admitting: Internal Medicine

## 2017-03-20 IMAGING — CR DG KNEE COMPLETE 4+V*L*
5 series · 5 of 5 positions shown · non-contrast
Comparison: None.

CLINICAL DATA: One week history of knee swelling.

EXAM:
LEFT KNEE - COMPLETE 4+ VIEW

[knee ap (1 of 4)]
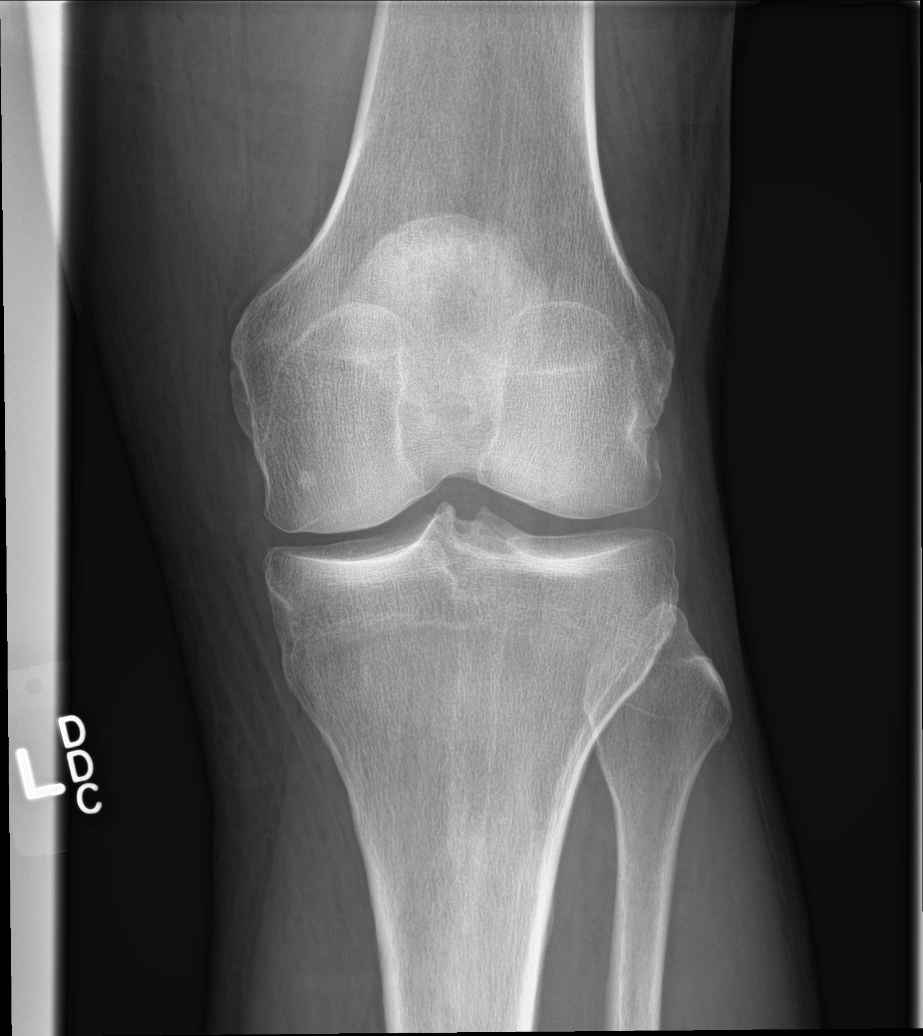

[patella skyline]
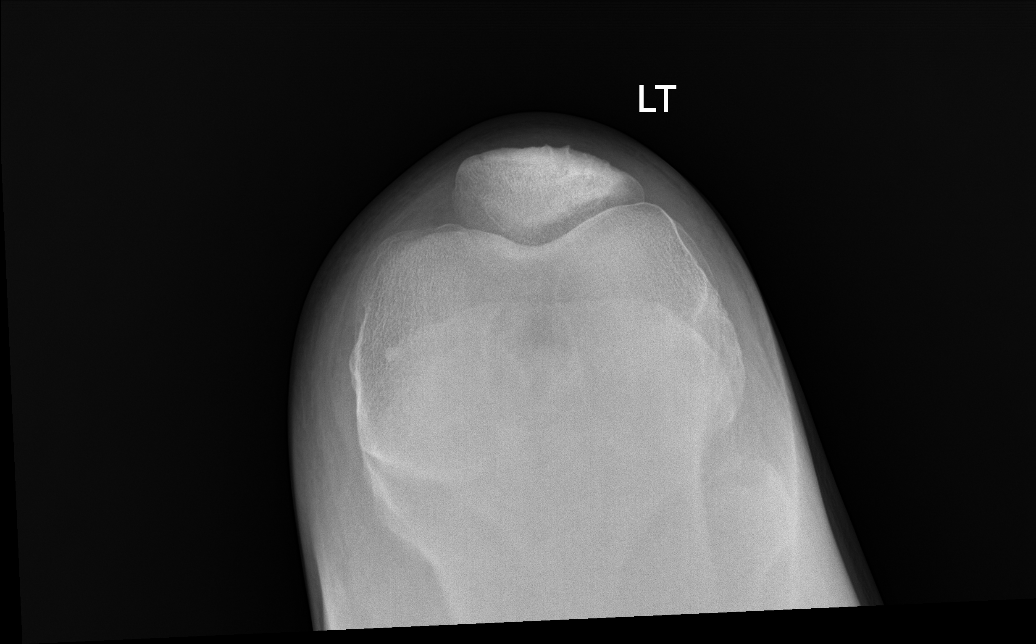

[knee ap (2 of 4)]
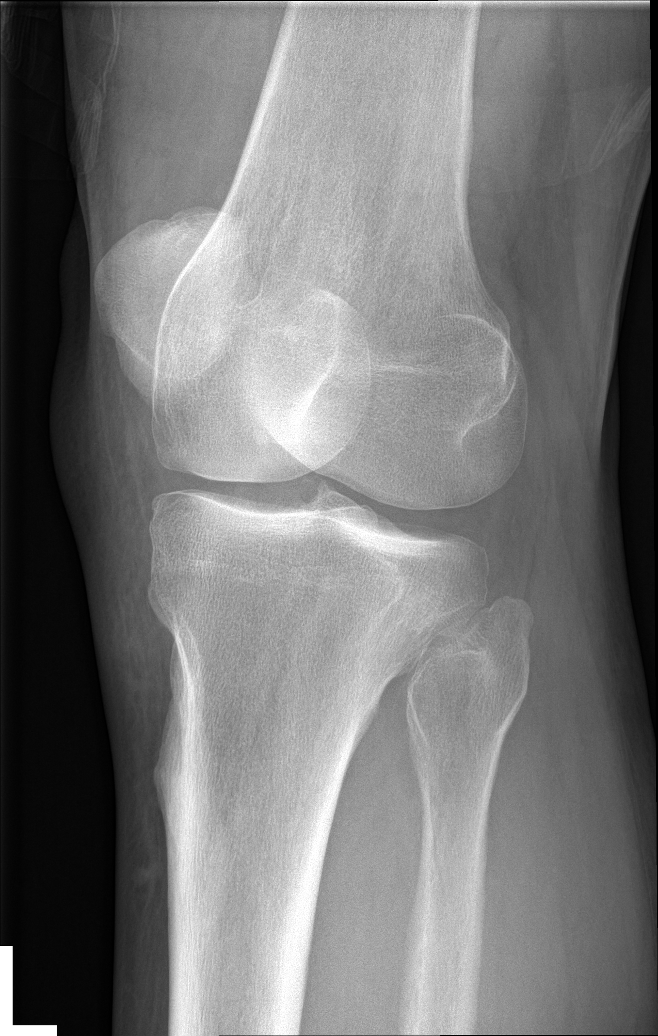

[knee ap (3 of 4)]
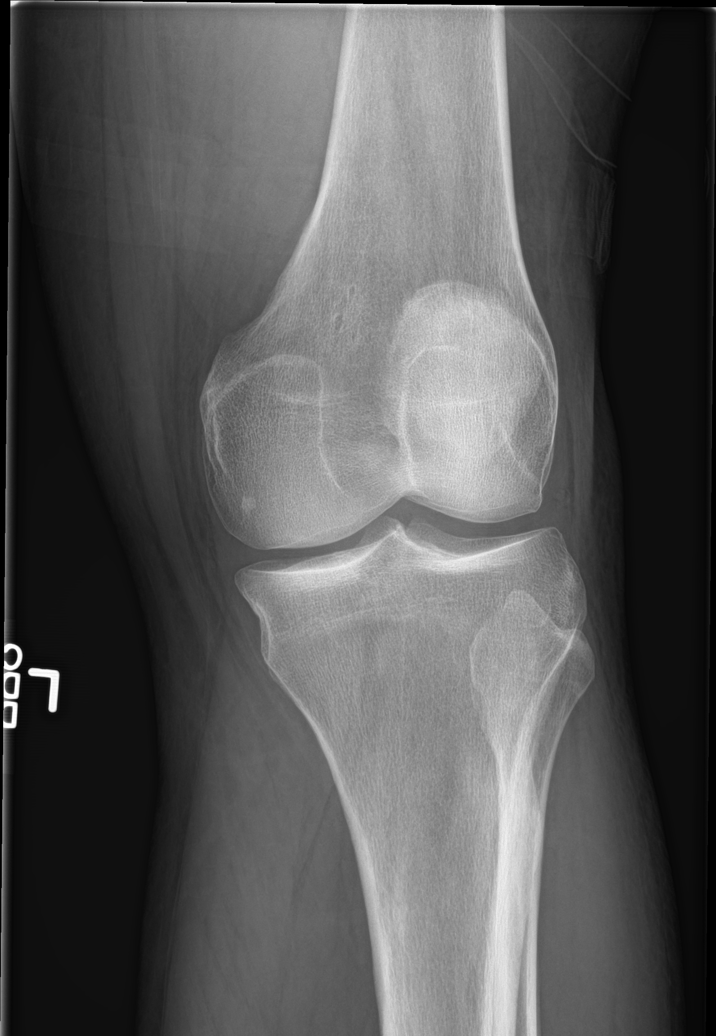

[knee ap (4 of 4)]
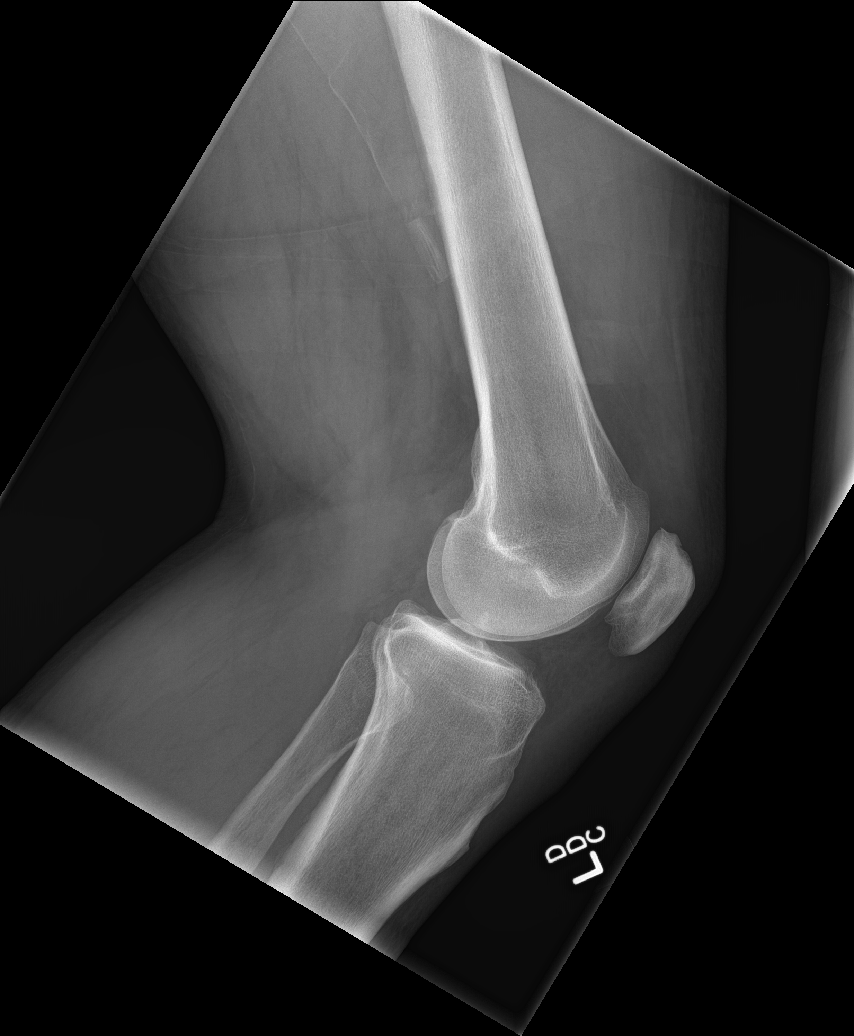

[5 of 5 positions shown; findings below may reference images not displayed]

FINDINGS: Frontal, lateral, bilateral oblique, and sunrise patellar images
obtained. There is no fracture or dislocation. No appreciable joint
effusion. There is slight patellofemoral joint space narrowing.
Other joint spaces appear normal. No erosive change.
IMPRESSION: Slight patellofemoral joint space narrowing. No fracture or
effusion.

## 2017-05-11 ENCOUNTER — Ambulatory Visit: Payer: Managed Care, Other (non HMO) | Admitting: Internal Medicine

## 2017-06-04 ENCOUNTER — Ambulatory Visit: Payer: Self-pay | Admitting: *Deleted

## 2017-06-04 NOTE — Telephone Encounter (Signed)
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 Pt  Advised  To  Go to  An  Urgent Care     Reason for Disposition . All other males with painful urination  Answer Assessment - Initial Assessment Questions 1. SEVERITY: "How bad is the pain?"  (e.g., Scale 1-10; mild, moderate, or severe)   - MILD (1-3): complains slightly about urination hurting   - MODERATE (4-7): interferes with normal activities     - SEVERE (8-10): excruciating, unwilling or unable to urinate because of the pain      mild 2. FREQUENCY: "How many times have you had painful urination today?"       Yesterday  afternnon     3. PATTERN: "Is pain present every time you urinate or just sometimes?"       Worse  At the  End of  Stream  intermittant   4. ONSET: "When did the painful urination start?"         4  Days  Ago  5. FEVER: "Do you have a fever?" If so, ask: "What is your temperature, how was it measured, and when did it start?"       Does  Not think  So   6. PAST UTI: "Have you had a urine infection before?" If so, ask: "When was the last time?" and "What happened that time?"        Never   7. CAUSE: "What do you think is causing the painful urination?"       Not  Really   8. OTHER SYMPTOMS: "Do you have any other symptoms?" (e.g., flank pain, penile discharge, scrotal pain, blood in urine)    Fatigue     Seems   Like  More  Frequent  Urination  Protocols used: URINATION PAIN - MALE-A-AH

## 2017-06-21 ENCOUNTER — Other Ambulatory Visit: Payer: Self-pay

## 2017-06-21 ENCOUNTER — Ambulatory Visit
Admission: EM | Admit: 2017-06-21 | Discharge: 2017-06-21 | Disposition: A | Discharge status: Home / Self Care | Payer: Managed Care, Other (non HMO) | Attending: Family Medicine | Admitting: Family Medicine

## 2017-06-21 DIAGNOSIS — R1030 Lower abdominal pain, unspecified: Secondary | ICD-10-CM

## 2017-06-21 DIAGNOSIS — K5900 Constipation, unspecified: Secondary | ICD-10-CM | POA: Diagnosis not present

## 2017-06-21 LAB — URINALYSIS, COMPLETE (UACMP) WITH MICROSCOPIC
Bacteria, UA: NONE SEEN
Bilirubin Urine: NEGATIVE
Glucose, UA: NEGATIVE mg/dL
HGB URINE DIPSTICK: NEGATIVE
Ketones, ur: NEGATIVE mg/dL
Leukocytes, UA: NEGATIVE
Nitrite: NEGATIVE
PH: 7.5 (ref 5.0–8.0)
Protein, ur: NEGATIVE mg/dL
Specific Gravity, Urine: 1.015 (ref 1.005–1.030)

## 2017-06-21 NOTE — ED Triage Notes (Signed)
Patient states he has been experiencing lower right abdominal and pelvic pain for 2 months. Patient states two weeks ago her was experiencing burning with urination. He states he has had nausea and frequent urination.

## 2017-06-21 NOTE — Discharge Instructions (Signed)
Colace (stool softener), Miralax Increase water intake; fiber in diet (prunes, etc)

## 2017-06-21 NOTE — ED Provider Notes (Signed)
[facility]    CSN: [id number] Arrival date & time: 06/21/17  4481     History   Chief Complaint Chief Complaint  Patient presents with  . Abdominal Pain    HPI Clifford Alexander is a 52 y.o. male.   52 yo male with a c/o   The history is provided by the patient.  Abdominal Pain  Pain location:  LLQ and RLQ Pain quality: aching   Pain radiates to:  Does not radiate Pain severity:  Moderate Onset quality:  Sudden Duration:  8 weeks Timing:  Intermittent Progression:  Waxing and waning Chronicity:  New Context: not alcohol use, not awakening from sleep, not diet changes, not eating, not laxative use, not medication withdrawal, not previous surgeries, not recent illness, not recent sexual activity, not recent travel, not retching, not sick contacts, not suspicious food intake and not trauma   Relieved by:  None tried Ineffective treatments:  None tried Associated symptoms: constipation and nausea   Associated symptoms: no anorexia, no belching, no chest pain, no chills, no cough, no diarrhea, no dysuria, no fatigue, no fever, no flatus, no hematemesis, no hematochezia, no hematuria, no melena, no shortness of breath, no sore throat and no vomiting   Risk factors: no alcohol abuse, no aspirin use, not elderly, has not had multiple surgeries, no NSAID use, not obese, not pregnant and no recent hospitalization     Past Medical History:  Diagnosis Date  . Bradycardia   . Fatigue 06/23/2008  . Hyperlipidemia   . Hypertension   . Insomnia 04/19/2007  . Shingles 01/2016    Patient Active Problem List   Diagnosis Date Noted  . CTS (carpal tunnel syndrome) 02/05/2017  . Numbness in feet 10/27/2016  . Vitamin D deficiency 10/27/2016  . Health care maintenance 11/19/2015  . Acute bilateral low back pain without sciatica 07/18/2015  . Sinusitis, acute maxillary 07/18/2015  . History of hay fever 10/23/2014  . GERD (gastroesophageal reflux disease) 10/23/2014  .  Cephalalgia 10/23/2014  . Dysmetabolic syndrome 85/63/1497  . Hyperglycemia 10/23/2014  . Hyperlipidemia 10/23/2014  . External thrombosed hemorrhoids 04/06/2008  . 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 Cannot sleep 04/19/2007  . Benign essential HTN 08/05/2006    Past Surgical History:  Procedure Laterality Date  . LUMBAR LAMINECTOMY  04/22/2007   L4-L5   discectomy 03/2008       Home Medications    Prior to Admission medications   Medication Sig Start Date End Date Taking? Authorizing Provider  b complex vitamins tablet Take 1 tablet by mouth daily.   Yes [provider]  cholecalciferol (VITAMIN D) 400 units TABS tablet Take 400 Units by mouth.   Yes [provider]  Cinnamon 500 MG capsule Take 500 mg by mouth daily.   Yes [provider]  co-enzyme Q-10 30 MG capsule Take 30 mg by mouth 3 (three) times daily.   Yes [provider]  lisinopril-hydrochlorothiazide (PRINZIDE,ZESTORETIC) 20-12.5 MG tablet TAKE ONE TABLET BY MOUTH ONCE DAILY 03/28/16  Yes Keith Rake Asad A, MD  magnesium oxide (MAG-OX) 400 MG tablet Take 400 mg by mouth daily.   Yes [provider]  mometasone (NASONEX) 50 MCG/ACT nasal spray Place 2 sprays daily into the nose. 02/02/17  Yes Einar Pheasant, MD  omega-3 acid ethyl esters (LOVAZA) 1 G capsule Take by mouth 2 (two) times daily.   Yes [provider]  PREBIOTIC PRODUCT PO Take by mouth.   Yes  [provider]  saw palmetto 160 MG capsule Take 160 mg by mouth 2 (two) times daily.   Yes [provider]  Turmeric 500 MG CAPS Take 500 mg by mouth 2 (two) times daily.   Yes [provider]    Family History Family History  Problem Relation Age of Onset  . Hyperthyroidism Mother   . Hypertension Father     Social History Social History   Tobacco Use  . Smoking status: Former Smoker    Packs/day: 1.00    Years: 10.00    Pack years: 10.00     Last attempt to quit: 04/18/1992    Years since quitting: 25.1  . Smokeless tobacco: Never Used  . Tobacco comment: quit in 1994  Substance Use Topics  . Alcohol use: No    Alcohol/week: 0.0 oz  . Drug use: No     Allergies   Naproxen and Ciprofloxacin   Review of Systems Review of Systems  Constitutional: Negative for chills, fatigue and fever.  HENT: Negative for sore throat.   Respiratory: Negative for cough and shortness of breath.   Cardiovascular: Negative for chest pain.  Gastrointestinal: Positive for abdominal pain, constipation and nausea. Negative for anorexia, diarrhea, flatus, hematemesis, hematochezia, melena and vomiting.  Genitourinary: Negative for dysuria and hematuria.     Physical Exam Triage Vital Signs ED Triage Vitals  Enc Vitals Group     BP 06/21/17 0831 (!) 137/94     Pulse Rate 06/21/17 0831 69     Resp --      Temp 06/21/17 0831 (!) 97.5 F (36.4 C)     Temp Source 06/21/17 0831 Oral     SpO2 06/21/17 0831 100 %     Weight 06/21/17 0828 260 lb (117.9 kg)     Height 06/21/17 0828 6\' 4"  (1.93 m)     Head Circumference --      Peak Flow --      Pain Score 06/21/17 0828 0     Pain Loc --      Pain Edu? --      Excl. in [place]? --    No data found.  Updated Vital Signs BP (!) 137/94 (BP Location: Left Arm)   Pulse 69   Temp (!) 97.5 F (36.4 C) (Oral)   Ht 6\' 4"  (1.93 m)   Wt 260 lb (117.9 kg)   SpO2 100%   BMI 31.65 kg/m   Visual Acuity Right Eye Distance:   Left Eye Distance:   Bilateral Distance:    Right Eye Near:   Left Eye Near:    Bilateral Near:     Physical Exam  Constitutional: He appears well-developed and well-nourished. No distress.  HENT:  Head: Normocephalic and atraumatic.  Cardiovascular: Normal rate.  Pulmonary/Chest: Effort normal. No respiratory distress.  Abdominal: Soft. Bowel sounds are normal. He exhibits no distension and no mass. There is no tenderness. There is no rebound and no guarding.    Neurological: He is alert.  Skin: No rash noted. He is not diaphoretic.  Nursing note and vitals reviewed.    UC Treatments / Results  Labs (all labs ordered are listed, but only abnormal results are displayed) Labs Reviewed  URINALYSIS, COMPLETE (UACMP) WITH MICROSCOPIC - Abnormal; Notable for the following components:      Result Value   Color, Urine STRAW (*)    Squamous Epithelial / LPF 0-5 (*)    All other components within normal limits  EKG None Radiology No results found.  Procedures Procedures (including critical care time)  Medications Ordered in UC Medications - No data to display   Initial Impression / Assessment and Plan / UC Course  I have reviewed the triage vital signs and the nursing notes.  Pertinent labs & imaging results that were available during my care of the patient were reviewed by me and considered in my medical decision making (see chart for details).       Final Clinical Impressions(s) / UC Diagnoses   Final diagnoses:  Constipation, unspecified constipation type  Lower abdominal pain    ED Discharge Orders    None     1. diagnosis reviewed with patient 2. Recommend supportive treatment with increased fluids and fiber; otc stool softener, Miralax prn  3. Follow-up prn if symptoms worsen or don't improve  Controlled Substance Prescriptions [place] Controlled Substance Registry consulted? Not Applicable   Norval Gable, MD 06/21/17 [phone]

## 2017-07-13 ENCOUNTER — Encounter: Payer: Self-pay | Admitting: Internal Medicine

## 2017-07-13 ENCOUNTER — Ambulatory Visit: Payer: Managed Care, Other (non HMO) | Admitting: Internal Medicine

## 2017-07-13 VITALS — BP 136/82 | HR 65 | Temp 97.7°F | Resp 18 | Wt 261.6 lb

## 2017-07-13 DIAGNOSIS — R42 Dizziness and giddiness: Secondary | ICD-10-CM

## 2017-07-13 DIAGNOSIS — I10 Essential (primary) hypertension: Secondary | ICD-10-CM | POA: Diagnosis not present

## 2017-07-13 DIAGNOSIS — R1084 Generalized abdominal pain: Secondary | ICD-10-CM

## 2017-07-13 DIAGNOSIS — H6122 Impacted cerumen, left ear: Secondary | ICD-10-CM

## 2017-07-13 DIAGNOSIS — R109 Unspecified abdominal pain: Secondary | ICD-10-CM | POA: Insufficient documentation

## 2017-07-13 DIAGNOSIS — R1031 Right lower quadrant pain: Secondary | ICD-10-CM

## 2017-07-13 DIAGNOSIS — E559 Vitamin D deficiency, unspecified: Secondary | ICD-10-CM

## 2017-07-13 DIAGNOSIS — R739 Hyperglycemia, unspecified: Secondary | ICD-10-CM | POA: Diagnosis not present

## 2017-07-13 DIAGNOSIS — G629 Polyneuropathy, unspecified: Secondary | ICD-10-CM

## 2017-07-13 DIAGNOSIS — E785 Hyperlipidemia, unspecified: Secondary | ICD-10-CM

## 2017-07-13 LAB — BASIC METABOLIC PANEL
BUN: 14 mg/dL (ref 6–23)
CHLORIDE: 104 meq/L (ref 96–112)
CO2: 27 mEq/L (ref 19–32)
Calcium: 9.3 mg/dL (ref 8.4–10.5)
Creatinine, Ser: 1.07 mg/dL (ref 0.40–1.50)
GFR: 76.67 mL/min (ref >60.00)
Glucose, Bld: 83 mg/dL (ref 70–99)
POTASSIUM: 4.3 meq/L (ref 3.5–5.1)
SODIUM: 139 meq/L (ref 135–145)

## 2017-07-13 LAB — CBC WITH DIFFERENTIAL/PLATELET
BASOS PCT: 0.4 % (ref 0.0–3.0)
Basophils Absolute: 0 10*3/uL (ref 0.0–0.1)
EOS ABS: 0.1 10*3/uL (ref 0.0–0.7)
Eosinophils Relative: 1.5 % (ref 0.0–5.0)
HEMATOCRIT: 45.2 % (ref 39.0–52.0)
HEMOGLOBIN: 15.4 g/dL (ref 13.0–17.0)
LYMPHS PCT: 32.2 % (ref 12.0–46.0)
Lymphs Abs: 1.5 10*3/uL (ref 0.7–4.0)
MCHC: 34.1 g/dL (ref 30.0–36.0)
MCV: 92.1 fl (ref 78.0–100.0)
Monocytes Absolute: 0.4 10*3/uL (ref 0.1–1.0)
Monocytes Relative: 9.8 % (ref 3.0–12.0)
Neutro Abs: 2.5 10*3/uL (ref 1.4–7.7)
Neutrophils Relative %: 56.1 % (ref 43.0–77.0)
Platelets: 147 10*3/uL — ABNORMAL LOW (ref 150.0–400.0)
RBC: 4.91 Mil/uL (ref 4.22–5.81)
RDW: 13.2 % (ref 11.5–15.5)
WBC: 4.5 10*3/uL (ref 4.0–10.5)

## 2017-07-13 LAB — HEMOGLOBIN A1C: HEMOGLOBIN A1C: 5.4 % (ref 4.6–6.5)

## 2017-07-13 LAB — LIPID PANEL
CHOL/HDL RATIO: 4
Cholesterol: 187 mg/dL (ref 0–200)
HDL: 42.1 mg/dL (ref >39.00)
LDL Cholesterol: 129 mg/dL — ABNORMAL HIGH (ref 0–99)
NONHDL: 144.43
TRIGLYCERIDES: 77 mg/dL (ref 0.0–149.0)
VLDL: 15.4 mg/dL (ref 0.0–40.0)

## 2017-07-13 LAB — HEPATIC FUNCTION PANEL
ALK PHOS: 51 U/L (ref 39–117)
ALT: 14 U/L (ref 0–53)
AST: 15 U/L (ref 0–37)
Albumin: 4.2 g/dL (ref 3.5–5.2)
Bilirubin, Direct: 0.1 mg/dL (ref 0.0–0.3)
TOTAL PROTEIN: 6.7 g/dL (ref 6.0–8.3)
Total Bilirubin: 0.8 mg/dL (ref 0.2–1.2)

## 2017-07-13 NOTE — Progress Notes (Signed)
Patient ID: Clifford Alexander, male   DOB: [birth year], 52 y.o.   MRN: [id number]   Subjective:    Patient ID: Clifford Alexander, male    DOB: 02-21-1966, 52 y.o.   MRN: [id number]  HPI  Patient here for a scheduled follow up.  He was evaluated 06/21/17 in ER and diagnosed with constipation.  Has continued to have intermittent pain over the last two months.  Taking miralax and taking stool softeners.  Is some better.  Had bowel movement this am.  Some intermittent color change.  Some occasional BRB with wiping.  Still with RLQ pain.  No vomiting. Occasional nausea.  Good appetite.  He also reports some intermittent dizziness.  He relates this to ear wax in left ear.  Some increased drainage.  No headache.  He also saw neurology recently.  Diagnosed with polyneuropathy.  Recommended starting B12, vitamin D and alpha lipoic acid.  Using cpap.  Blood pressure doing better.     Past Medical History:  Diagnosis Date  . Bradycardia   . Fatigue 06/23/2008  . Hyperlipidemia   . Hypertension   . Insomnia 04/19/2007  . Shingles 01/2016   Past Surgical History:  Procedure Laterality Date  . LUMBAR LAMINECTOMY  04/22/2007   L4-L5   discectomy 03/2008   Family History  Problem Relation Age of Onset  . Hyperthyroidism Mother   . Hypertension Father    Social History   Socioeconomic History  . Marital status: Married    Spouse name: Not on file  . Number of children: Not on file  . Years of education: Not on file  . Highest education level: Not on file  Occupational History  . Not on file  Social Needs  . Financial resource strain: Not on file  . Food insecurity:    Worry: Not on file    Inability: Not on file  . Transportation needs:    Medical: Not on file    Non-medical: Not on file  Tobacco Use  . Smoking status: Former Smoker    Packs/day: 1.00    Years: 10.00    Pack years: 10.00    Last attempt to quit: 04/18/1992    Years since quitting: 25.2  . Smokeless tobacco: Never Used  .  Tobacco comment: quit in 1994  Substance and Sexual Activity  . Alcohol use: No    Alcohol/week: 0.0 oz  . Drug use: No  . Sexual activity: Not on file  Lifestyle  . Physical activity:    Days per week: Not on file    Minutes per session: Not on file  . Stress: Not on file  Relationships  . Social connections:    Talks on phone: Not on file    Gets together: Not on file    Attends religious service: Not on file    Active member of club or organization: Not on file    Attends meetings of clubs or organizations: Not on file    Relationship status: Not on file  Other Topics Concern  . Not on file  Social History Narrative  . Not on file    Outpatient Encounter Medications as of 07/13/2017  Medication Sig  . b complex vitamins tablet Take 1 tablet by mouth daily.  . cholecalciferol (VITAMIN D) 400 units TABS tablet Take 400 Units by mouth.  . co-enzyme Q-10 30 MG capsule Take 30 mg by mouth 3 (three) times daily.  Marland Kitchen lisinopril-hydrochlorothiazide (PRINZIDE,ZESTORETIC) 20-12.5 MG tablet TAKE ONE TABLET BY  MOUTH ONCE DAILY  . mometasone (NASONEX) 50 MCG/ACT nasal spray Place 2 sprays daily into the nose.  . omega-3 acid ethyl esters (LOVAZA) 1 G capsule Take by mouth 2 (two) times daily.  Marland Kitchen PREBIOTIC PRODUCT PO Take by mouth.  . saw palmetto 160 MG capsule Take 160 mg by mouth 2 (two) times daily.  . Turmeric 500 MG CAPS Take 500 mg by mouth 2 (two) times daily.  . magnesium oxide (MAG-OX) 400 MG tablet Take 400 mg by mouth daily.  . [DISCONTINUED] Cinnamon 500 MG capsule Take 500 mg by mouth daily.   No facility-administered encounter medications on file as of 07/13/2017.     Review of Systems  Constitutional: Negative for appetite change and unexpected weight change.  HENT: Negative for congestion and sinus pressure.   Respiratory: Negative for cough, chest tightness and shortness of breath.   Cardiovascular: Negative for chest pain, palpitations and leg swelling.    Gastrointestinal: Negative for nausea and vomiting.       RLQ pain.  Bowels seem to be doing some better with miralax and stool softener.    Genitourinary: Negative for difficulty urinating and dysuria.  Musculoskeletal: Negative for joint swelling and myalgias.  Skin: Negative for color change and rash.  Neurological: Positive for dizziness. Negative for headaches.  Psychiatric/Behavioral: Negative for agitation and dysphoric mood.       Objective:     Blood pressure rechecked by me:  136/82  Physical Exam  Constitutional: He appears well-developed and well-nourished. No distress.  HENT:  Nose: Nose normal.  Mouth/Throat: Oropharynx is clear and moist.  Ears:  Left ear - cerumen impaction.   Right - clear.   Neck: Neck supple. No thyromegaly present.  Cardiovascular: Normal rate and regular rhythm.  Pulmonary/Chest: Effort normal and breath sounds normal. No respiratory distress.  Abdominal: Soft. Bowel sounds are normal.  Minimal tenderness right lower abdomen.    Musculoskeletal: He exhibits no edema or tenderness.  Lymphadenopathy:    He has no cervical adenopathy.  Skin: No rash noted. No erythema.  Psychiatric: He has a normal mood and affect. His behavior is normal.    BP 136/82   Pulse 65   Temp 97.7 F (36.5 C) (Oral)   Resp 18   Wt 261 lb 9.6 oz (118.7 kg)   SpO2 97%   BMI 31.84 kg/m  Wt Readings from Last 3 Encounters:  07/13/17 261 lb 9.6 oz (118.7 kg)  06/21/17 260 lb (117.9 kg)  02/02/17 262 lb 12.8 oz (119.2 kg)     Lab Results  Component Value Date   WBC 4.5 07/13/2017   HGB 15.4 07/13/2017   HCT 45.2 07/13/2017   PLT 147.0 (L) 07/13/2017   GLUCOSE 83 07/13/2017   CHOL 187 07/13/2017   TRIG 77.0 07/13/2017   HDL 42.10 07/13/2017   LDLCALC 129 (H) 07/13/2017   ALT 14 07/13/2017   AST 15 07/13/2017   NA 139 07/13/2017   K 4.3 07/13/2017   CL 104 07/13/2017   CREATININE 1.07 07/13/2017   BUN 14 07/13/2017   CO2 27 07/13/2017   TSH 1.82  10/27/2016   PSA 0.61 10/27/2016   HGBA1C 5.4 07/13/2017       Assessment & Plan:   Problem List Items Addressed This Visit    Abdominal pain    Persistent RLQ pain as outlined.  Bowels doing better.  Check CT abdomen/pelvis.  Also check met b and liver panel.  Relevant Orders   CBC with Differential/Platelet (Completed)   Hepatic function panel (Completed)   CT Abdomen Pelvis W Contrast   CT Abdomen Pelvis W Contrast   Benign essential HTN - Primary    Blood pressure under good control.  Continue same medication regimen.  Follow pressures.  Follow metabolic panel.         Relevant Orders   Basic metabolic panel (Completed)   Hyperglycemia    Low carb diet and exercise.  Follow met b and a1c.        Relevant Orders   Hemoglobin A1c (Completed)   Hyperlipidemia    Low cholesterol diet and exercise.  Follow lipid panel.       Relevant Orders   Lipid panel (Completed)   Polyneuropathy    Seeing neurology.  On B12 and alpha lipoic acid.  Follow.        Vitamin D deficiency    Follow vitamin D level.         Other Visit Diagnoses    Impacted cerumen of left ear       cerumen impaction left ear.  some dizziness.  refer to ENT for evaluation and treatment.     Relevant Orders   Ambulatory referral to ENT   Dizziness       Relates to cerumen impaction.  refer to ENT as outlined.  nasacort nasal spray to help with drainage.     Relevant Orders   Ambulatory referral to ENT       Einar Pheasant, MD

## 2017-07-13 NOTE — Patient Instructions (Signed)
nasonex nasal spray - 2 sprays each nostril one time per day.  Do this in the evening.

## 2017-07-14 ENCOUNTER — Other Ambulatory Visit: Payer: Self-pay | Admitting: Internal Medicine

## 2017-07-14 DIAGNOSIS — D696 Thrombocytopenia, unspecified: Secondary | ICD-10-CM

## 2017-07-14 NOTE — Progress Notes (Signed)
Order placed for f/u platelet count.  

## 2017-07-18 ENCOUNTER — Encounter: Payer: Self-pay | Admitting: Internal Medicine

## 2017-07-18 DIAGNOSIS — G629 Polyneuropathy, unspecified: Secondary | ICD-10-CM | POA: Insufficient documentation

## 2017-07-18 NOTE — Assessment & Plan Note (Signed)
Seeing neurology.  On B12 and alpha lipoic acid.  Follow.

## 2017-07-18 NOTE — Assessment & Plan Note (Signed)
Follow vitamin D level.  

## 2017-07-18 NOTE — Assessment & Plan Note (Signed)
Low cholesterol diet and exercise.  Follow lipid panel.   

## 2017-07-18 NOTE — Assessment & Plan Note (Signed)
Persistent RLQ pain as outlined.  Bowels doing better.  Check CT abdomen/pelvis.  Also check met b and liver panel.

## 2017-07-18 NOTE — Assessment & Plan Note (Signed)
Low carb diet and exercise.  Follow met b and a1c.   

## 2017-07-18 NOTE — Assessment & Plan Note (Signed)
Blood pressure under good control.  Continue same medication regimen.  Follow pressures.  Follow metabolic panel.   

## 2017-07-23 ENCOUNTER — Encounter (INDEPENDENT_AMBULATORY_CARE_PROVIDER_SITE_OTHER): Payer: Self-pay

## 2017-07-26 ENCOUNTER — Emergency Department
Admission: EM | Admit: 2017-07-26 | Discharge: 2017-07-26 | Disposition: A | Discharge status: Home / Self Care | Payer: Managed Care, Other (non HMO) | Attending: Emergency Medicine | Admitting: Emergency Medicine

## 2017-07-26 ENCOUNTER — Other Ambulatory Visit: Payer: Self-pay

## 2017-07-26 DIAGNOSIS — G43909 Migraine, unspecified, not intractable, without status migrainosus: Secondary | ICD-10-CM | POA: Diagnosis not present

## 2017-07-26 DIAGNOSIS — Z87891 Personal history of nicotine dependence: Secondary | ICD-10-CM | POA: Insufficient documentation

## 2017-07-26 DIAGNOSIS — R51 Headache: Secondary | ICD-10-CM | POA: Diagnosis present

## 2017-07-26 DIAGNOSIS — I10 Essential (primary) hypertension: Secondary | ICD-10-CM | POA: Diagnosis not present

## 2017-07-26 DIAGNOSIS — Z79899 Other long term (current) drug therapy: Secondary | ICD-10-CM | POA: Diagnosis not present

## 2017-07-26 LAB — COMPREHENSIVE METABOLIC PANEL
ALBUMIN: 4.4 g/dL (ref 3.5–5.0)
ALK PHOS: 56 U/L (ref 38–126)
ALT: 16 U/L — AB (ref 17–63)
ANION GAP: 9 (ref 5–15)
AST: 25 U/L (ref 15–41)
BUN: 13 mg/dL (ref 6–20)
CALCIUM: 9.1 mg/dL (ref 8.9–10.3)
CO2: 27 mmol/L (ref 22–32)
CREATININE: 0.98 mg/dL (ref 0.61–1.24)
Chloride: 103 mmol/L (ref 101–111)
GFR calc Af Amer: >60 mL/min (ref >60)
GFR calc non Af Amer: >60 mL/min (ref >60)
GLUCOSE: 121 mg/dL — AB (ref 65–99)
Potassium: 4.1 mmol/L (ref 3.5–5.1)
SODIUM: 139 mmol/L (ref 135–145)
Total Bilirubin: 1 mg/dL (ref 0.3–1.2)
Total Protein: 7 g/dL (ref 6.5–8.1)

## 2017-07-26 LAB — CBC WITH DIFFERENTIAL/PLATELET
BASOS ABS: 0 10*3/uL (ref 0–0.1)
BASOS PCT: 0 %
EOS ABS: 0 10*3/uL (ref 0–0.7)
Eosinophils Relative: 0 %
HCT: 44.6 % (ref 40.0–52.0)
HEMOGLOBIN: 15.2 g/dL (ref 13.0–18.0)
Lymphocytes Relative: 13 %
Lymphs Abs: 1.1 10*3/uL (ref 1.0–3.6)
MCH: 31.4 pg (ref 26.0–34.0)
MCHC: 34.2 g/dL (ref 32.0–36.0)
MCV: 92 fL (ref 80.0–100.0)
Monocytes Absolute: 0.6 10*3/uL (ref 0.2–1.0)
Monocytes Relative: 7 %
NEUTROS ABS: 6.8 10*3/uL — AB (ref 1.4–6.5)
NEUTROS PCT: 80 %
Platelets: 152 10*3/uL (ref 150–440)
RBC: 4.85 MIL/uL (ref 4.40–5.90)
RDW: 13.1 % (ref 11.5–14.5)
WBC: 8.5 10*3/uL (ref 3.8–10.6)

## 2017-07-26 MED ORDER — ACETAMINOPHEN 500 MG PO TABS
1000.0000 mg | ORAL_TABLET | Freq: Once | ORAL | Status: AC
  Administered 2017-07-26: 1000 mg via ORAL
  Filled 2017-07-26: qty 2

## 2017-07-26 MED ORDER — SODIUM CHLORIDE 0.9 % IV BOLUS
500.0000 mL | INTRAVENOUS | Status: AC
  Administered 2017-07-26: 500 mL via INTRAVENOUS

## 2017-07-26 MED ORDER — DIPHENHYDRAMINE HCL 50 MG/ML IJ SOLN
25.0000 mg | INTRAMUSCULAR | Status: AC
  Administered 2017-07-26: 25 mg via INTRAVENOUS
  Filled 2017-07-26: qty 1

## 2017-07-26 MED ORDER — METOCLOPRAMIDE HCL 5 MG/ML IJ SOLN
10.0000 mg | INTRAMUSCULAR | Status: AC
  Administered 2017-07-26: 10 mg via INTRAVENOUS
  Filled 2017-07-26: qty 2

## 2017-07-26 MED ORDER — DEXAMETHASONE SODIUM PHOSPHATE 10 MG/ML IJ SOLN
10.0000 mg | Freq: Once | INTRAMUSCULAR | Status: AC
  Administered 2017-07-26: 10 mg via INTRAVENOUS
  Filled 2017-07-26: qty 1

## 2017-07-26 NOTE — ED Provider Notes (Signed)
[facility] Emergency Department Provider Note  ____________________________________________   First MD Initiated Contact with Patient 07/26/17 [phone]     (approximate)  I have reviewed the triage vital signs and the nursing notes.   HISTORY  Chief Complaint Hypertension and Headache    HPI Clifford Alexander is a 52 y.o. male with medical history as listed below who presents for evaluation of headache, nausea/vomiting, and hypertension.  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  This morning when he awoke he said that his headache was severe and present in a band-like pattern from the front to the back of his head.  He had light sensitivity and nausea and vomited 3 times.  However since that time his symptoms have improved and he has only a mild residual headache with some mild residual nausea.  He has had no focal numbness nor weakness in any of his extremities, no difficulty speaking nor ambulating, no gait disturbance.  He has no history of head trauma.  He denies neck pain, neck stiffness, chest pain, shortness of breath, and dysuria.  He has some chronic abdominal pain for which she has an outpatient CT scan of the abdomen and pelvis scheduled tomorrow.  Nothing in particular made his sharp and aching headache any better but it did improve gradually over time.  Light made it worse.  Past Medical History:  Diagnosis Date  . Bradycardia   . Fatigue 06/23/2008  . Hyperlipidemia   . Hypertension   . Insomnia 04/19/2007  . Shingles 01/2016    Patient Active Problem List   Diagnosis Date Noted  . Polyneuropathy 07/18/2017  . Abdominal pain 07/13/2017  . CTS (carpal tunnel syndrome) 02/05/2017  . Numbness in feet  10/27/2016  . Vitamin D deficiency 10/27/2016  . Health care maintenance 11/19/2015  . Acute bilateral low back pain without sciatica 07/18/2015  . Sinusitis, acute maxillary 07/18/2015  . History of hay fever 10/23/2014  . GERD (gastroesophageal reflux disease) 10/23/2014  . Cephalalgia 10/23/2014  . Dysmetabolic syndrome 34/74/2595  . Hyperglycemia 10/23/2014  . Hyperlipidemia 10/23/2014  . External thrombosed hemorrhoids 04/06/2008  . 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 Cannot sleep 04/19/2007  . Benign essential HTN 08/05/2006    Past Surgical History:  Procedure Laterality Date  . LUMBAR LAMINECTOMY  04/22/2007   L4-L5   discectomy 03/2008    Prior to Admission medications   Medication Sig Start Date End Date Taking? Authorizing Provider  b complex vitamins tablet Take 1 tablet by mouth daily.    [provider]  cholecalciferol (VITAMIN D) 400 units TABS tablet Take 400 Units by mouth.    [provider]  co-enzyme Q-10 30 MG capsule Take 30 mg by mouth 3 (three) times daily.    [provider]  lisinopril-hydrochlorothiazide (PRINZIDE,ZESTORETIC) 20-12.5 MG tablet TAKE ONE TABLET BY MOUTH ONCE DAILY 03/28/16   Roselee Nova, MD  magnesium oxide (MAG-OX) 400 MG tablet Take 400 mg by mouth daily.    [provider]  mometasone (NASONEX) 50 MCG/ACT nasal spray Place 2 sprays daily into the nose. 02/02/17   Einar Pheasant, MD  omega-3 acid ethyl esters (LOVAZA) 1 G capsule Take  by mouth 2 (two) times daily.    [provider]  PREBIOTIC PRODUCT PO Take by mouth.    [provider]  saw palmetto 160 MG capsule Take 160 mg by mouth 2 (two) times daily.    [provider]  Turmeric 500 MG CAPS Take 500 mg by mouth 2 (two) times daily.    [provider]    Allergies Naproxen and Ciprofloxacin  Family History  Problem Relation Age of Onset  . Hyperthyroidism Mother   .  Hypertension Father     Social History Social History   Tobacco Use  . Smoking status: Former Smoker    Packs/day: 1.00    Years: 10.00    Pack years: 10.00    Last attempt to quit: 04/18/1992    Years since quitting: 25.2  . Smokeless tobacco: Never Used  . Tobacco comment: quit in 1994  Substance Use Topics  . Alcohol use: No    Alcohol/week: 0.0 oz  . Drug use: No    Review of Systems Constitutional: No fever/chills Eyes: No visual changes but has photophobia ENT: No sore throat. Cardiovascular: Denies chest pain. Respiratory: Denies shortness of breath. Gastrointestinal: Chronic and unchanged abdominal pain.  Nausea with 3 episodes of vomiting.  No diarrhea.  No constipation. Genitourinary: Negative for dysuria. Musculoskeletal: Negative for neck pain.  Negative for back pain. Integumentary: Negative for rash. Neurological: Severe generalized headache when he awoke this morning with photophobia, nausea, and vomiting.  The pain has lessened to mild.  No numbness nor weakness.   ____________________________________________   PHYSICAL EXAM:  VITAL SIGNS: ED Triage Vitals  Enc Vitals Group     BP 07/26/17 0846 (!) 179/89     Pulse Rate 07/26/17 0846 64     Resp 07/26/17 0846 16     Temp 07/26/17 0846 97.6 F (36.4 C)     Temp Source 07/26/17 0846 Oral     SpO2 07/26/17 0846 100 %     Weight 07/26/17 0848 120.2 kg (265 lb)     Height 07/26/17 0848 1.93 m (6\' 4" )     Head Circumference --      Peak Flow --      Pain Score 07/26/17 0848 9     Pain Loc --      Pain Edu? --      Excl. in [place]? --     Constitutional: Alert and oriented. Well appearing and in no acute distress. Eyes: Conjunctivae are normal. PERRL. EOMI. Head: Atraumatic. Nose: No congestion/rhinnorhea. Mouth/Throat: Mucous membranes are moist. Neck: No stridor.  No meningeal signs.   Cardiovascular: Normal rate, regular rhythm. Good peripheral circulation. Grossly normal heart  sounds. Respiratory: Normal respiratory effort.  No retractions. Lungs CTAB. Gastrointestinal: Soft and nontender. No distention.  Musculoskeletal: No lower extremity tenderness nor edema. No gross deformities of extremities. Neurologic:  Normal speech and language. No gross focal neurologic deficits are appreciated.  Skin:  Skin is warm, dry and intact. No rash noted. Psychiatric: Mood and affect are normal. Speech and behavior are normal.  ____________________________________________   LABS (all labs ordered are listed, but only abnormal results are displayed)  Labs Reviewed  CBC WITH DIFFERENTIAL/PLATELET - Abnormal; Notable for the following components:      Result Value   Neutro Abs 6.8 (*)    All other components within normal limits  COMPREHENSIVE METABOLIC PANEL - Abnormal; Notable for the following components:   Glucose, Bld 121 (*)    ALT 16 (*)  All other components within normal limits   ____________________________________________  EKG  None - EKG not ordered by ED physician ____________________________________________  RADIOLOGY   ED MD interpretation:  No indication for imaging  Official radiology report(s): No results found.  ____________________________________________   PROCEDURES  Critical Care performed: No   Procedure(s) performed:   Procedures   ____________________________________________   INITIAL IMPRESSION / ASSESSMENT AND PLAN / ED COURSE  As part of my medical decision making, I reviewed the following data within the [facility] notes reviewed and incorporated, Labs reviewed  and Old chart reviewed    Differential diagnosis includes, but is not limited to, intracranial hemorrhage, meningitis/encephalitis, previous head trauma, cavernous venous thrombosis, tension headache, temporal arteritis, migraine or migraine equivalent, idiopathic intracranial hypertension, and non-specific headache.  Cluster  headache is certainly possible but based on the photophobia, nausea, and vomiting, and the fact that he has been using a CPAP for the last 3 weeks, I think that a migraine is more likely this morning.  I understand that his blood pressure is elevated but I do not think this represents hypertensive urgency/emergency, and instead I think it is more likely his blood pressure is elevated secondarily as a result of the discomfort.  We discussed the possibility of imaging but I am not sure it is absolutely necessary at this time.  We agreed that we would treat empirically for a migraine and then reassess; if his pain has not improved or certainly if his symptoms have gotten worse, then we will proceed with imaging.  CBC and metabolic panel are pending.  I am proceeding with a standard "migraine cocktail" except without Toradol because he reports an allergy to NSAIDs.  He understands and agrees with the plan.  Clinical Course as of Jul 26 1057  Sun Jul 26, 2017  1030 Normal CBC results  CBC with Differential/Platelet(!) [CF]  2956 Normal metabolic panel results with no evidence of acute abnormality and certainly no suggestion of hypertensive urgency/emergency  Comprehensive metabolic panel(!) [CF]  2130 Reassessed patient.  He states he feels much better and the headache is almost completely gone.  He is smiling and interactive and feels well enough to go home.  We once again discussed getting a CT scan of the head but he does not feel it is necessary and I agree.  I encourage close outpatient follow-up and  I gave my usual and customary return precautions.    [CF]    Clinical Course User Index [CF] Hinda Kehr, MD    ____________________________________________  FINAL CLINICAL IMPRESSION(S) / ED DIAGNOSES  Final diagnoses:  Migraine without status migrainosus, not intractable, unspecified migraine type     MEDICATIONS GIVEN DURING THIS VISIT:  Medications  metoCLOPramide (REGLAN) injection 10  mg (10 mg Intravenous Given 07/26/17 0958)  diphenhydrAMINE (BENADRYL) injection 25 mg (25 mg Intravenous Given 07/26/17 0958)  dexamethasone (DECADRON) injection 10 mg (10 mg Intravenous Given 07/26/17 0958)  sodium chloride 0.9 % bolus 500 mL (0 mLs Intravenous Stopped 07/26/17 1044)  acetaminophen (TYLENOL) tablet 1,000 mg (1,000 mg Oral Given 07/26/17 8657)     ED Discharge Orders    None       Note:  This document was prepared using Dragon voice recognition software and may include unintentional dictation errors.    Hinda Kehr, MD 07/26/17 1059

## 2017-07-26 NOTE — ED Triage Notes (Signed)
Pt states that this am he awoke with a headache and nausea - they called a nurse line and were advised to come to ED for eval - pt states that he took Lisinopril this am and also started using a c-pap a few weeks ago

## 2017-07-26 NOTE — Discharge Instructions (Signed)

## 2017-07-27 ENCOUNTER — Ambulatory Visit
Admission: RE | Admit: 2017-07-27 | Discharge: 2017-07-27 | Disposition: A | Discharge status: Home / Self Care | Payer: Managed Care, Other (non HMO) | Source: Ambulatory Visit | Attending: Internal Medicine | Admitting: Internal Medicine

## 2017-07-27 DIAGNOSIS — K5641 Fecal impaction: Secondary | ICD-10-CM | POA: Insufficient documentation

## 2017-07-27 DIAGNOSIS — M48061 Spinal stenosis, lumbar region without neurogenic claudication: Secondary | ICD-10-CM | POA: Insufficient documentation

## 2017-07-27 DIAGNOSIS — R1084 Generalized abdominal pain: Secondary | ICD-10-CM | POA: Insufficient documentation

## 2017-07-27 DIAGNOSIS — R1031 Right lower quadrant pain: Secondary | ICD-10-CM | POA: Diagnosis present

## 2017-07-27 DIAGNOSIS — K439 Ventral hernia without obstruction or gangrene: Secondary | ICD-10-CM | POA: Diagnosis not present

## 2017-07-27 DIAGNOSIS — I700 Atherosclerosis of aorta: Secondary | ICD-10-CM | POA: Diagnosis not present

## 2017-07-27 MED ORDER — IOPAMIDOL (ISOVUE-300) INJECTION 61%
100.0000 mL | Freq: Once | INTRAVENOUS | Status: AC | PRN
  Administered 2017-07-27: 100 mL via INTRAVENOUS

## 2017-07-28 ENCOUNTER — Ambulatory Visit: Payer: Managed Care, Other (non HMO)

## 2017-07-29 ENCOUNTER — Other Ambulatory Visit: Payer: Self-pay | Admitting: Internal Medicine

## 2017-07-29 DIAGNOSIS — R1031 Right lower quadrant pain: Secondary | ICD-10-CM

## 2017-07-29 NOTE — Progress Notes (Signed)
Order placed for GI referral.   

## 2017-08-10 ENCOUNTER — Other Ambulatory Visit (INDEPENDENT_AMBULATORY_CARE_PROVIDER_SITE_OTHER): Payer: Managed Care, Other (non HMO)

## 2017-08-10 DIAGNOSIS — D696 Thrombocytopenia, unspecified: Secondary | ICD-10-CM | POA: Diagnosis not present

## 2017-08-10 LAB — PLATELET COUNT: Platelets: 149 10*3/uL (ref 140–400)

## 2017-08-11 ENCOUNTER — Encounter: Payer: Self-pay | Admitting: Internal Medicine

## 2017-09-21 ENCOUNTER — Ambulatory Visit: Payer: Managed Care, Other (non HMO) | Admitting: Internal Medicine

## 2017-09-21 ENCOUNTER — Encounter: Payer: Self-pay | Admitting: Internal Medicine

## 2017-09-21 VITALS — BP 138/78 | HR 49 | Temp 97.6°F | Resp 16 | Wt 261.0 lb

## 2017-09-21 DIAGNOSIS — Z125 Encounter for screening for malignant neoplasm of prostate: Secondary | ICD-10-CM | POA: Diagnosis not present

## 2017-09-21 DIAGNOSIS — I10 Essential (primary) hypertension: Secondary | ICD-10-CM

## 2017-09-21 DIAGNOSIS — G4730 Sleep apnea, unspecified: Secondary | ICD-10-CM

## 2017-09-21 DIAGNOSIS — R739 Hyperglycemia, unspecified: Secondary | ICD-10-CM

## 2017-09-21 DIAGNOSIS — R51 Headache: Secondary | ICD-10-CM

## 2017-09-21 DIAGNOSIS — E785 Hyperlipidemia, unspecified: Secondary | ICD-10-CM

## 2017-09-21 DIAGNOSIS — R519 Headache, unspecified: Secondary | ICD-10-CM

## 2017-09-21 DIAGNOSIS — E559 Vitamin D deficiency, unspecified: Secondary | ICD-10-CM

## 2017-09-21 MED ORDER — LISINOPRIL 10 MG PO TABS: 10.0000 mg | ORAL_TABLET | Freq: Every day | ORAL | 2 refills | Status: DC

## 2017-09-21 NOTE — Progress Notes (Signed)
Patient ID: Clifford Alexander, male   DOB: [birth year], 52 y.o.   MRN: [id number]   Subjective:    Patient ID: Clifford Alexander, male    DOB: [birth year], 52 y.o.   MRN: [id number]  HPI  Patient here for a scheduled follow up.  Was evaluated in ER 07/26/17 for headache, nausea/vomiting and hypertension.  Note reviewed.  Had associated light sensitivity.  No focal numbness or weakness.  Had started cpap a few weeks earlier.  He was diagnosed with migraine.  He reports that he has not had another episode like that.  Will notice some headaches.  He was questioning if related the the mask or related to not being able to sleep more than 4-5 hours a night (secondary to cpap).  Also reports dry mouth.  He has been off his been off his blood pressure medication for the last few weeks.  Was not sure if this could be contributing to his symptoms.  States blood pressure averaging 135-138/85-88.  (did notice 147/98 previously).  Heart rate is in the 50s range.  States he has been worked up previously for bradycardia.  States everything check out ok.     Past Medical History:  Diagnosis Date  . Bradycardia   . Fatigue 06/23/2008  . Hyperlipidemia   . Hypertension   . Insomnia 04/19/2007  . Shingles 01/2016   Past Surgical History:  Procedure Laterality Date  . LUMBAR LAMINECTOMY  04/22/2007   L4-L5   discectomy 03/2008   Family History  Problem Relation Age of Onset  . Hyperthyroidism Mother   . Hypertension Father    Social History   Socioeconomic History  . Marital status: Married    Spouse name: Not on file  . Number of children: Not on file  . Years of education: Not on file  . Highest education level: Not on file  Occupational History  . Not on file  Social Needs  . Financial resource strain: Not on file  . Food insecurity:    Worry: Not on file    Inability: Not on file  . Transportation needs:    Medical: Not on file    Non-medical: Not on file  Tobacco Use  . Smoking status: Former  Smoker    Packs/day: 1.00    Years: 10.00    Pack years: 10.00    Last attempt to quit: 04/18/1992    Years since quitting: 25.4  . Smokeless tobacco: Never Used  . Tobacco comment: quit in 1994  Substance and Sexual Activity  . Alcohol use: No    Alcohol/week: 0.0 oz  . Drug use: No  . Sexual activity: Not on file  Lifestyle  . Physical activity:    Days per week: Not on file    Minutes per session: Not on file  . Stress: Not on file  Relationships  . Social connections:    Talks on phone: Not on file    Gets together: Not on file    Attends religious service: Not on file    Active member of club or organization: Not on file    Attends meetings of clubs or organizations: Not on file    Relationship status: Not on file  Other Topics Concern  . Not on file  Social History Narrative  . Not on file    Outpatient Encounter Medications as of 09/21/2017  Medication Sig  . b complex vitamins tablet Take 1 tablet by mouth daily.  . cholecalciferol (VITAMIN D) 400  units TABS tablet Take 400 Units by mouth.  . co-enzyme Q-10 30 MG capsule Take 30 mg by mouth 3 (three) times daily.  Marland Kitchen lisinopril (PRINIVIL,ZESTRIL) 10 MG tablet Take 1 tablet (10 mg total) by mouth daily.  . magnesium oxide (MAG-OX) 400 MG tablet Take 400 mg by mouth daily.  . mometasone (NASONEX) 50 MCG/ACT nasal spray Place 2 sprays daily into the nose.  . omega-3 acid ethyl esters (LOVAZA) 1 G capsule Take by mouth 2 (two) times daily.  Marland Kitchen PREBIOTIC PRODUCT PO Take by mouth.  . saw palmetto 160 MG capsule Take 160 mg by mouth 2 (two) times daily.  . Turmeric 500 MG CAPS Take 500 mg by mouth 2 (two) times daily.  . [DISCONTINUED] lisinopril-hydrochlorothiazide (PRINZIDE,ZESTORETIC) 20-12.5 MG tablet TAKE ONE TABLET BY MOUTH ONCE DAILY   No facility-administered encounter medications on file as of 09/21/2017.     Review of Systems  Constitutional: Negative for appetite change and unexpected weight change.  HENT:  Negative for congestion and sinus pressure.        Dry mouth.   Respiratory: Negative for cough, chest tightness and shortness of breath.   Cardiovascular: Negative for chest pain, palpitations and leg swelling.  Gastrointestinal: Negative for abdominal pain, diarrhea and nausea.  Genitourinary: Negative for difficulty urinating and dysuria.  Musculoskeletal: Negative for joint swelling and myalgias.  Skin: Negative for color change and rash.  Neurological: Positive for headaches. Negative for dizziness and light-headedness.  Psychiatric/Behavioral: Negative for agitation and dysphoric mood.       Objective:    Physical Exam  Constitutional: He appears well-developed and well-nourished. No distress.  HENT:  Nose: Nose normal.  Mouth/Throat: Oropharynx is clear and moist.  Neck: Neck supple. No thyromegaly present.  Cardiovascular: Normal rate and regular rhythm.  Pulmonary/Chest: Effort normal and breath sounds normal. No respiratory distress.  Abdominal: Soft. Bowel sounds are normal. There is no tenderness.  Musculoskeletal: He exhibits no edema or tenderness.  Lymphadenopathy:    He has no cervical adenopathy.  Skin: No rash noted. No erythema.  Psychiatric: He has a normal mood and affect. His behavior is normal.    BP 138/78 (BP Location: Left Arm, Patient Position: Sitting, Cuff Size: Large)   Pulse (!) 49   Temp 97.6 F (36.4 C) (Oral)   Resp 16   Wt 261 lb (118.4 kg)   SpO2 99%   BMI 31.77 kg/m  Wt Readings from Last 3 Encounters:  09/21/17 261 lb (118.4 kg)  07/26/17 265 lb (120.2 kg)  07/13/17 261 lb 9.6 oz (118.7 kg)     Lab Results  Component Value Date   WBC 8.5 07/26/2017   HGB 15.2 07/26/2017   HCT 44.6 07/26/2017   PLT 149 08/10/2017   GLUCOSE 121 (H) 07/26/2017   CHOL 187 07/13/2017   TRIG 77.0 07/13/2017   HDL 42.10 07/13/2017   LDLCALC 129 (H) 07/13/2017   ALT 16 (L) 07/26/2017   AST 25 07/26/2017   NA 139 07/26/2017   K 4.1 07/26/2017     CL 103 07/26/2017   CREATININE 0.98 07/26/2017   BUN 13 07/26/2017   CO2 27 07/26/2017   TSH 1.82 10/27/2016   PSA 0.61 10/27/2016   HGBA1C 5.4 07/13/2017    Ct Abdomen Pelvis W Contrast  Result Date: 07/27/2017 CLINICAL DATA:  Abdominal pain EXAM: CT ABDOMEN AND PELVIS WITH CONTRAST TECHNIQUE: Multidetector CT imaging of the abdomen and pelvis was performed using the standard protocol following bolus administration  of intravenous contrast. Oral contrast was also administered. CONTRAST:  143m ISOVUE-300 IOPAMIDOL (ISOVUE-300) INJECTION 61% COMPARISON:  None. FINDINGS: Lower chest: Lung bases are clear. Hepatobiliary: No focal liver lesions are apparent. Gallbladder wall is not appreciably thickened. There is no biliary duct dilatation. Pancreas: There is no pancreatic mass or inflammatory focus. Spleen: No splenic lesions are evident. Adrenals/Urinary Tract: Adrenals bilaterally appear unremarkable. There is a cyst arising from the lateral right kidney measuring 1.6 x 1.5 cm. 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 There is no evident hydronephrosis on either side. There is no appreciable renal or ureteral calculus on either side. Urinary bladder is midline with wall thickness within normal limits. Stomach/Bowel: There is moderate stool throughout the colon. There is no appreciable bowel wall or mesenteric thickening. There is no evident bowel obstruction. No free air or portal venous air. Vascular/Lymphatic: There is atherosclerotic calcification in the aorta. No aneurysm. Major mesenteric vessels appear patent. There is no appreciable adenopathy in the abdomen or pelvis. Reproductive: Prostate and seminal vesicles appear normal in size and contour. No evident pelvic mass. Other: Appendix appears normal. There is no abscess or ascites in the abdomen or pelvis. There is a rather minimal ventral hernia containing only fat. Musculoskeletal: There is degenerative change in  the lumbar spine. There is moderate spinal stenosis at L3-4 and L4-5 due to bony hypertrophy as well as disc protrusion. No evident blastic or lytic bone lesions. There is no intramuscular or abdominal wall lesions. IMPRESSION: 1.  Moderate spinal stenosis at L3-4 and L4-5, multifactorial. 2. Moderate stool throughout colon. No bowel obstruction. No abscess. Appendix appears normal. 3.  No renal or ureteral calculus.  No hydronephrosis. 4.  Aortic atherosclerosis. 5.  Minimal ventral hernia containing only fat. Aortic Atherosclerosis (ICD10-I70.0). Electronically Signed   By: WLowella GripIII M.D.   On: 07/27/2017 16:26       Assessment & Plan:   Problem List Items Addressed This Visit    Benign essential HTN    Blood pressure elevated.  On no medication now.  Start lisinopril 129mq day.  Follow pressures.  Follow metabolic panel.  Get him back in soon to reassess.        Relevant Medications   lisinopril (PRINIVIL,ZESTRIL) 10 MG tablet   Other Relevant Orders   CBC with Differential/Platelet   TSH   Basic metabolic panel   Headache    Seen in ER as outlined.  No headaches like this since.  Was diagnosed with migraine.  No history of migraine.  He feels related to not sleeping well and trying to get adjusted to cpap.  Discussed further w/up and evaluation including scanning, neuro w/up, etc.  He wants to hold on any further evaluation right now.  magoxide daily as directed.  Contact sleep company to see if can improve comfort with mask and hopefully can get him sleeping longer.        Hyperglycemia    Low carb diet and exercise.  Follow met b and a1c.        Relevant Orders   Hemoglobin A1c   Hyperlipidemia    Low cholesterol diet and exercise.  Follow lipid panel.        Relevant Medications   lisinopril (PRINIVIL,ZESTRIL) 10 MG tablet   Other Relevant Orders   Hepatic function panel   Lipid panel   Sleep apnea    Using cpap.  Trying to get adjusted.  Affecting his sleep.  Discussed with him today.  He plans to contact his sleep company to see if can adjust for more comfort.  Follow.        Vitamin D deficiency    Follow vitamin D level.       Relevant Orders   VITAMIN D 25 Hydroxy (Vit-D Deficiency, Fractures)    Other Visit Diagnoses    Prostate cancer screening    -  Primary   Relevant Orders   PSA       Einar Pheasant, MD

## 2017-09-26 ENCOUNTER — Encounter: Payer: Self-pay | Admitting: Internal Medicine

## 2017-09-26 DIAGNOSIS — R51 Headache: Secondary | ICD-10-CM

## 2017-09-26 DIAGNOSIS — G4730 Sleep apnea, unspecified: Secondary | ICD-10-CM | POA: Insufficient documentation

## 2017-09-26 DIAGNOSIS — R519 Headache, unspecified: Secondary | ICD-10-CM | POA: Insufficient documentation

## 2017-09-26 DIAGNOSIS — G4733 Obstructive sleep apnea (adult) (pediatric): Secondary | ICD-10-CM | POA: Insufficient documentation

## 2017-09-26 NOTE — Assessment & Plan Note (Signed)
Using cpap.  Trying to get adjusted.  Affecting his sleep.  Discussed with him today.  He plans to contact his sleep company to see if can adjust for more comfort.  Follow.

## 2017-09-26 NOTE — Assessment & Plan Note (Signed)
Blood pressure elevated.  On no medication now.  Start lisinopril 10mg  q day.  Follow pressures.  Follow metabolic panel.  Get him back in soon to reassess.

## 2017-09-26 NOTE — Assessment & Plan Note (Signed)
Low carb diet and exercise.  Follow met b and a1c.

## 2017-09-26 NOTE — Assessment & Plan Note (Signed)
Follow vitamin D level.  

## 2017-09-26 NOTE — Assessment & Plan Note (Signed)
Low cholesterol diet and exercise.  Follow lipid panel.   

## 2017-09-26 NOTE — Assessment & Plan Note (Signed)
Seen in ER as outlined.  No headaches like this since.  Was diagnosed with migraine.  No history of migraine.  He feels related to not sleeping well and trying to get adjusted to cpap.  Discussed further w/up and evaluation including scanning, neuro w/up, etc.  He wants to hold on any further evaluation right now.  magoxide daily as directed.  Contact sleep company to see if can improve comfort with mask and hopefully can get him sleeping longer.

## 2017-10-26 ENCOUNTER — Other Ambulatory Visit (INDEPENDENT_AMBULATORY_CARE_PROVIDER_SITE_OTHER): Payer: Managed Care, Other (non HMO)

## 2017-10-26 DIAGNOSIS — E785 Hyperlipidemia, unspecified: Secondary | ICD-10-CM | POA: Diagnosis not present

## 2017-10-26 DIAGNOSIS — E559 Vitamin D deficiency, unspecified: Secondary | ICD-10-CM

## 2017-10-26 DIAGNOSIS — Z125 Encounter for screening for malignant neoplasm of prostate: Secondary | ICD-10-CM | POA: Diagnosis not present

## 2017-10-26 DIAGNOSIS — R739 Hyperglycemia, unspecified: Secondary | ICD-10-CM

## 2017-10-26 DIAGNOSIS — I10 Essential (primary) hypertension: Secondary | ICD-10-CM | POA: Diagnosis not present

## 2017-10-26 LAB — HEPATIC FUNCTION PANEL
ALBUMIN: 4.4 g/dL (ref 3.5–5.2)
ALK PHOS: 52 U/L (ref 39–117)
ALT: 11 U/L (ref 0–53)
AST: 14 U/L (ref 0–37)
Bilirubin, Direct: 0.3 mg/dL (ref 0.0–0.3)
TOTAL PROTEIN: 7 g/dL (ref 6.0–8.3)
Total Bilirubin: 1.5 mg/dL — ABNORMAL HIGH (ref 0.2–1.2)

## 2017-10-26 LAB — PSA: PSA: 0.65 ng/mL (ref 0.10–4.00)

## 2017-10-26 LAB — BASIC METABOLIC PANEL
BUN: 13 mg/dL (ref 6–23)
CHLORIDE: 105 meq/L (ref 96–112)
CO2: 26 mEq/L (ref 19–32)
Calcium: 9.9 mg/dL (ref 8.4–10.5)
Creatinine, Ser: 1.28 mg/dL (ref 0.40–1.50)
GFR: 62.28 mL/min (ref >60.00)
GLUCOSE: 95 mg/dL (ref 70–99)
POTASSIUM: 4 meq/L (ref 3.5–5.1)
Sodium: 141 mEq/L (ref 135–145)

## 2017-10-26 LAB — LIPID PANEL
CHOL/HDL RATIO: 5
Cholesterol: 198 mg/dL (ref 0–200)
HDL: 40.1 mg/dL (ref >39.00)
LDL Cholesterol: 139 mg/dL — ABNORMAL HIGH (ref 0–99)
NONHDL: 157.83
Triglycerides: 94 mg/dL (ref 0.0–149.0)
VLDL: 18.8 mg/dL (ref 0.0–40.0)

## 2017-10-26 LAB — CBC WITH DIFFERENTIAL/PLATELET
BASOS ABS: 0 10*3/uL (ref 0.0–0.1)
Basophils Relative: 0.3 % (ref 0.0–3.0)
Eosinophils Absolute: 0.1 10*3/uL (ref 0.0–0.7)
Eosinophils Relative: 1.3 % (ref 0.0–5.0)
HEMATOCRIT: 45.3 % (ref 39.0–52.0)
HEMOGLOBIN: 15.7 g/dL (ref 13.0–17.0)
LYMPHS PCT: 29.5 % (ref 12.0–46.0)
Lymphs Abs: 1.4 10*3/uL (ref 0.7–4.0)
MCHC: 34.7 g/dL (ref 30.0–36.0)
MCV: 91.3 fl (ref 78.0–100.0)
Monocytes Absolute: 0.4 10*3/uL (ref 0.1–1.0)
Monocytes Relative: 8.1 % (ref 3.0–12.0)
Neutro Abs: 2.8 10*3/uL (ref 1.4–7.7)
Neutrophils Relative %: 60.8 % (ref 43.0–77.0)
Platelets: 149 10*3/uL — ABNORMAL LOW (ref 150.0–400.0)
RBC: 4.96 Mil/uL (ref 4.22–5.81)
RDW: 12.9 % (ref 11.5–15.5)
WBC: 4.7 10*3/uL (ref 4.0–10.5)

## 2017-10-26 LAB — HEMOGLOBIN A1C: Hgb A1c MFr Bld: 5.5 % (ref 4.6–6.5)

## 2017-10-26 LAB — VITAMIN D 25 HYDROXY (VIT D DEFICIENCY, FRACTURES): VITD: 47.52 ng/mL (ref 30.00–100.00)

## 2017-10-26 LAB — TSH: TSH: 1.57 u[IU]/mL (ref 0.35–4.50)

## 2017-10-26 LAB — HM COLONOSCOPY

## 2017-10-27 ENCOUNTER — Encounter: Payer: Self-pay | Admitting: Internal Medicine

## 2017-11-02 ENCOUNTER — Ambulatory Visit: Payer: Managed Care, Other (non HMO) | Admitting: Internal Medicine

## 2017-11-02 ENCOUNTER — Encounter: Payer: Self-pay | Admitting: Internal Medicine

## 2017-11-02 VITALS — BP 128/78 | HR 60 | Temp 97.8°F | Resp 18 | Wt 255.0 lb

## 2017-11-02 DIAGNOSIS — N281 Cyst of kidney, acquired: Secondary | ICD-10-CM

## 2017-11-02 DIAGNOSIS — E785 Hyperlipidemia, unspecified: Secondary | ICD-10-CM | POA: Diagnosis not present

## 2017-11-02 DIAGNOSIS — I10 Essential (primary) hypertension: Secondary | ICD-10-CM | POA: Diagnosis not present

## 2017-11-02 DIAGNOSIS — R1031 Right lower quadrant pain: Secondary | ICD-10-CM

## 2017-11-02 DIAGNOSIS — R739 Hyperglycemia, unspecified: Secondary | ICD-10-CM | POA: Diagnosis not present

## 2017-11-02 LAB — BASIC METABOLIC PANEL
BUN: 15 mg/dL (ref 6–23)
CO2: 31 mEq/L (ref 19–32)
CREATININE: 1.34 mg/dL (ref 0.40–1.50)
Calcium: 10 mg/dL (ref 8.4–10.5)
Chloride: 104 mEq/L (ref 96–112)
GFR: 59.07 mL/min — AB (ref >60.00)
Glucose, Bld: 103 mg/dL — ABNORMAL HIGH (ref 70–99)
Potassium: 4.7 mEq/L (ref 3.5–5.1)
Sodium: 140 mEq/L (ref 135–145)

## 2017-11-02 NOTE — Assessment & Plan Note (Signed)
Had CT for right lower quadrant pain.  Pain is better.  Will still notice some intermittent discomfort.  Appears to be more msk.  Previous CT as outlined.  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  Bowels moving.  Eating well.

## 2017-11-02 NOTE — Assessment & Plan Note (Signed)
Low cholesterol diet and exercise.  Follow lipid panel.   

## 2017-11-02 NOTE — Assessment & Plan Note (Signed)
Low carb diet and exercise.  Follow met b and a1c.   

## 2017-11-02 NOTE — Progress Notes (Signed)
Patient ID: Clifford Alexander, male   DOB: 04/17/1965, 52 y.o.   MRN: [id number]   Subjective:    Patient ID: Clifford Alexander, male    DOB: 10-Jul-1965, 52 y.o.   MRN: [id number]  HPI  Patient here for a scheduled follow up.  He reports he is doing relatively well.  Blood pressure is doing better.  Started on lisinopril last visit.  Tolerating.  No chest pain.  No sob.  No acid reflux.  No abdominal pain.  Bowels moving.  No urine change.  Has some intermittent side pain - right.  Not constant.  Appears to be more msk.  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  Discussed f/u with urology to confirm if further w/up or f/u warranted.     Past Medical History:  Diagnosis Date  . Bradycardia   . Fatigue 06/23/2008  . Hyperlipidemia   . Hypertension   . Insomnia 04/19/2007  . Shingles 01/2016   Past Surgical History:  Procedure Laterality Date  . LUMBAR LAMINECTOMY  04/22/2007   L4-L5   discectomy 03/2008   Family History  Problem Relation Age of Onset  . Hyperthyroidism Mother   . Hypertension Father    Social History   Socioeconomic History  . Marital status: Married    Spouse name: Not on file  . Number of children: Not on file  . Years of education: Not on file  . Highest education level: Not on file  Occupational History  . Not on file  Social Needs  . Financial resource strain: Not on file  . Food insecurity:    Worry: Not on file    Inability: Not on file  . Transportation needs:    Medical: Not on file    Non-medical: Not on file  Tobacco Use  . Smoking status: Former Smoker    Packs/day: 1.00    Years: 10.00    Pack years: 10.00    Last attempt to quit: 04/18/1992    Years since quitting: 25.5  . Smokeless tobacco: Never Used  . Tobacco comment: quit in 1994  Substance and Sexual Activity  . Alcohol use: No    Alcohol/week: 0.0 oz  . Drug use: No  . Sexual activity: Not on file  Lifestyle  . Physical activity:    Days per week: Not on  file    Minutes per session: Not on file  . Stress: Not on file  Relationships  . Social connections:    Talks on phone: Not on file    Gets together: Not on file    Attends religious service: Not on file    Active member of club or organization: Not on file    Attends meetings of clubs or organizations: Not on file    Relationship status: Not on file  Other Topics Concern  . Not on file  Social History Narrative  . Not on file    Outpatient Encounter Medications as of 11/02/2017  Medication Sig  . b complex vitamins tablet Take 1 tablet by mouth daily.  . cholecalciferol (VITAMIN D) 400 units TABS tablet Take 400 Units by mouth.  . co-enzyme Q-10 30 MG capsule Take 30 mg by mouth 3 (three) times daily.  .Marland Kitchenlisinopril (PRINIVIL,ZESTRIL) 10 MG tablet Take 1 tablet (10 mg total) by mouth daily.  . magnesium oxide (MAG-OX) 400 MG tablet Take 400 mg by mouth daily.  . mometasone (NASONEX) 50 MCG/ACT nasal spray Place 2 sprays daily  into the nose.  . omega-3 acid ethyl esters (LOVAZA) 1 G capsule Take by mouth 2 (two) times daily.  Marland Kitchen PREBIOTIC PRODUCT PO Take by mouth.  . saw palmetto 160 MG capsule Take 160 mg by mouth 2 (two) times daily.  . Turmeric 500 MG CAPS Take 500 mg by mouth 2 (two) times daily.   No facility-administered encounter medications on file as of 11/02/2017.     Review of Systems  Constitutional: Negative for appetite change and unexpected weight change.  HENT: Negative for congestion and sinus pressure.   Respiratory: Negative for cough, chest tightness and shortness of breath.   Cardiovascular: Negative for chest pain, palpitations and leg swelling.  Gastrointestinal: Negative for abdominal pain, diarrhea, nausea and vomiting.  Genitourinary: Negative for difficulty urinating and dysuria.  Musculoskeletal: Negative for joint swelling and myalgias.  Skin: Negative for color change and rash.  Neurological: Negative for dizziness, light-headedness and headaches.    Psychiatric/Behavioral: Negative for agitation and dysphoric mood.       Objective:     Blood pressure rechecked by me:  128/78  Physical Exam  Constitutional: He appears well-developed and well-nourished. No distress.  HENT:  Nose: Nose normal.  Mouth/Throat: Oropharynx is clear and moist.  Neck: Neck supple. No thyromegaly present.  Cardiovascular: Normal rate and regular rhythm.  Pulmonary/Chest: Effort normal and breath sounds normal. No respiratory distress.  Abdominal: Soft. Bowel sounds are normal. There is no tenderness.  Musculoskeletal: He exhibits no edema or tenderness.  Lymphadenopathy:    He has no cervical adenopathy.  Skin: No rash noted. No erythema.  Psychiatric: He has a normal mood and affect. His behavior is normal.    BP 128/78   Pulse 60   Temp 97.8 F (36.6 C) (Oral)   Resp 18   Wt 255 lb (115.7 kg)   SpO2 98%   BMI 31.04 kg/m  Wt Readings from Last 3 Encounters:  11/02/17 255 lb (115.7 kg)  09/21/17 261 lb (118.4 kg)  07/26/17 265 lb (120.2 kg)     Lab Results  Component Value Date   WBC 4.7 10/26/2017   HGB 15.7 10/26/2017   HCT 45.3 10/26/2017   PLT 149.0 (L) 10/26/2017   GLUCOSE 103 (H) 11/02/2017   CHOL 198 10/26/2017   TRIG 94.0 10/26/2017   HDL 40.10 10/26/2017   LDLCALC 139 (H) 10/26/2017   ALT 11 10/26/2017   AST 14 10/26/2017   NA 140 11/02/2017   K 4.7 11/02/2017   CL 104 11/02/2017   CREATININE 1.34 11/02/2017   BUN 15 11/02/2017   CO2 31 11/02/2017   TSH 1.57 10/26/2017   PSA 0.65 10/26/2017   HGBA1C 5.5 10/26/2017    Ct Abdomen Pelvis W Contrast  Result Date: 07/27/2017 CLINICAL DATA:  Abdominal pain EXAM: CT ABDOMEN AND PELVIS WITH CONTRAST TECHNIQUE: Multidetector CT imaging of the abdomen and pelvis was performed using the standard protocol following bolus administration of intravenous contrast. Oral contrast was also administered. CONTRAST:  155m ISOVUE-300 IOPAMIDOL (ISOVUE-300) INJECTION 61% COMPARISON:   None. FINDINGS: Lower chest: Lung bases are clear. Hepatobiliary: No focal liver lesions are apparent. Gallbladder wall is not appreciably thickened. There is no biliary duct dilatation. Pancreas: There is no pancreatic mass or inflammatory focus. Spleen: No splenic lesions are evident. Adrenals/Urinary Tract: Adrenals bilaterally appear unremarkable. There is a cyst arising from the lateral right kidney measuring 1.6 x 1.5 cm. 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 There is  no evident hydronephrosis on either side. There is no appreciable renal or ureteral calculus on either side. Urinary bladder is midline with wall thickness within normal limits. Stomach/Bowel: There is moderate stool throughout the colon. There is no appreciable bowel wall or mesenteric thickening. There is no evident bowel obstruction. No free air or portal venous air. Vascular/Lymphatic: There is atherosclerotic calcification in the aorta. No aneurysm. Major mesenteric vessels appear patent. There is no appreciable adenopathy in the abdomen or pelvis. Reproductive: Prostate and seminal vesicles appear normal in size and contour. No evident pelvic mass. Other: Appendix appears normal. There is no abscess or ascites in the abdomen or pelvis. There is a rather minimal ventral hernia containing only fat. Musculoskeletal: There is degenerative change in the lumbar spine. There is moderate spinal stenosis at L3-4 and L4-5 due to bony hypertrophy as well as disc protrusion. No evident blastic or lytic bone lesions. There is no intramuscular or abdominal wall lesions. IMPRESSION: 1.  Moderate spinal stenosis at L3-4 and L4-5, multifactorial. 2. Moderate stool throughout colon. No bowel obstruction. No abscess. Appendix appears normal. 3.  No renal or ureteral calculus.  No hydronephrosis. 4.  Aortic atherosclerosis. 5.  Minimal ventral hernia containing only fat. Aortic Atherosclerosis (ICD10-I70.0). Electronically  Signed   By: Lowella Grip III M.D.   On: 07/27/2017 16:26       Assessment & Plan:   Problem List Items Addressed This Visit    Abdominal pain    Had CT for right lower quadrant pain.  Pain is better.  Will still notice some intermittent discomfort.  Appears to be more msk.  Previous CT as outlined.  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  Bowels moving.  Eating well.        Benign essential HTN - Primary    Blood pressure on recheck improved.  Same medication regimen.  Follow pressures.  Follow metabolic panel.        Relevant Orders   Basic metabolic panel (Completed)   Hyperglycemia    Low carb diet and exercise.  Follow met b and a1c.        Hyperlipidemia    Low cholesterol diet and exercise.  Follow lipid panel.         Other Visit Diagnoses    Renal cyst       Relevant Orders   Ambulatory referral to Urology       Einar Pheasant, MD

## 2017-11-02 NOTE — Assessment & Plan Note (Signed)
Blood pressure on recheck improved.  Same medication regimen.  Follow pressures.  Follow metabolic panel.   

## 2017-11-03 ENCOUNTER — Other Ambulatory Visit: Payer: Self-pay | Admitting: *Deleted

## 2017-11-03 DIAGNOSIS — I10 Essential (primary) hypertension: Secondary | ICD-10-CM

## 2017-11-03 MED ORDER — AMLODIPINE BESYLATE 5 MG PO TABS: 5.0000 mg | ORAL_TABLET | Freq: Every day | ORAL | 3 refills | Status: DC

## 2017-11-23 ENCOUNTER — Other Ambulatory Visit (INDEPENDENT_AMBULATORY_CARE_PROVIDER_SITE_OTHER): Payer: Managed Care, Other (non HMO)

## 2017-11-23 DIAGNOSIS — I10 Essential (primary) hypertension: Secondary | ICD-10-CM

## 2017-11-23 LAB — BASIC METABOLIC PANEL
BUN: 13 mg/dL (ref 6–23)
CALCIUM: 9.9 mg/dL (ref 8.4–10.5)
CO2: 30 mEq/L (ref 19–32)
Chloride: 101 mEq/L (ref 96–112)
Creatinine, Ser: 1.23 mg/dL (ref 0.40–1.50)
GFR: 65.19 mL/min (ref >60.00)
GLUCOSE: 106 mg/dL — AB (ref 70–99)
Potassium: 3.9 mEq/L (ref 3.5–5.1)
Sodium: 137 mEq/L (ref 135–145)

## 2017-11-24 ENCOUNTER — Encounter: Payer: Self-pay | Admitting: Internal Medicine

## 2017-12-07 ENCOUNTER — Ambulatory Visit: Payer: Managed Care, Other (non HMO) | Admitting: Urology

## 2018-02-08 ENCOUNTER — Encounter: Payer: Self-pay | Admitting: Urology

## 2018-02-08 ENCOUNTER — Ambulatory Visit: Payer: Managed Care, Other (non HMO) | Admitting: Urology

## 2018-02-08 VITALS — BP 171/95 | HR 64 | Ht 76.0 in | Wt 258.0 lb

## 2018-02-08 DIAGNOSIS — N281 Cyst of kidney, acquired: Secondary | ICD-10-CM | POA: Diagnosis not present

## 2018-02-08 DIAGNOSIS — D1771 Benign lipomatous neoplasm of kidney: Secondary | ICD-10-CM | POA: Diagnosis not present

## 2018-02-08 LAB — URINALYSIS, COMPLETE
Bilirubin, UA: NEGATIVE
Glucose, UA: NEGATIVE
Ketones, UA: NEGATIVE
LEUKOCYTES UA: NEGATIVE
Nitrite, UA: NEGATIVE
PH UA: 7 (ref 5.0–7.5)
PROTEIN UA: NEGATIVE
SPEC GRAV UA: 1.015 (ref 1.005–1.030)
Urobilinogen, Ur: 0.2 mg/dL (ref 0.2–1.0)

## 2018-02-08 LAB — MICROSCOPIC EXAMINATION
Epithelial Cells (non renal): NONE SEEN /hpf (ref 0–10)
WBC, UA: NONE SEEN /hpf (ref 0–5)

## 2018-02-08 MED ORDER — TAMSULOSIN HCL 0.4 MG PO CAPS: 0.4000 mg | ORAL_CAPSULE | Freq: Every day | ORAL | 11 refills | Status: DC

## 2018-02-08 NOTE — Progress Notes (Signed)
 02/08/2018 5:54 PM   Clifford Alexander 03/09/1966 [id number]  Referring provider: Scott, Charlene, MD [address] [place], [place] [address]  CC: Renal cyst  HPI: I had the pleasure of meeting Clifford Alexander today in consultation for renal cyst from Dr. Scott.  He is a 52-year-old healthy male with incidental finding of a right 1.5 cm simple renal cyst, an 8 mm left angiomyolipoma on CT scan in April 2019.  He is asymptomatic with no flank pain or hematuria.  He denies any weight loss or bone pain.  There is no family history of kidney cancer.  He does have a family history of prostate cancer in his grandfather, and his PSAs have been within the normal range.  He does report some mild weak stream and nocturia, and has never tried medications for this previously.  He has a 10-pack-year smoking history and quit 24 years ago.   PMH: Past Medical History:  Diagnosis Date  . Bradycardia   . Fatigue 06/23/2008  . Hyperlipidemia   . Hypertension   . Insomnia 04/19/2007  . Shingles 01/2016  . Sleep apnea     Surgical History: Past Surgical History:  Procedure Laterality Date  . LUMBAR LAMINECTOMY  04/22/2007   L4-L5   discectomy 03/2008    Allergies:  Allergies  Allergen Reactions  . Naproxen Shortness Of Breath  . Ciprofloxacin   . Ibuprofen     Family History: Family History  Problem Relation Age of Onset  . Hyperthyroidism Mother   . Kidney disease Mother   . Hypertension Father   . Prostate cancer Paternal Grandfather     Social History:  reports that he quit smoking about 25 years ago. He has a 10.00 pack-year smoking history. He has never used smokeless tobacco. He reports that he does not drink alcohol or use drugs.  ROS: Please see flowsheet from today's date for complete review of systems.  Physical Exam: BP (!) 171/95 (BP Location: Right Arm, Patient Position: Sitting, Cuff Size: Large)   Pulse 64   Ht 6' 4" (1.93 m)   Wt 258 lb (117 kg)    BMI 31.40 kg/m    Constitutional:  Alert and oriented, No acute distress. Cardiovascular: No clubbing, cyanosis, or edema. Respiratory: Normal respiratory effort, no increased work of breathing. GI: Abdomen is soft, nontender, nondistended, no abdominal masses GU: No CVA tenderness DRE: Deferred Lymph: No cervical or inguinal lymphadenopathy. Skin: No rashes, bruises or suspicious lesions. Neurologic: Grossly intact, no focal deficits, moving all 4 extremities. Psychiatric: Normal mood and affect.  Laboratory Data: PSA 0.65 Creatinine 1.23, EGFR 65   Pertinent Imaging: I have personally reviewed the CT abdomen pelvis with contrast dated 07/27/2017.  There is a simple cyst on the right kidney measuring 1.5 cm.  There is an 8 mm angiomyolipoma in the lateral left kidney.  No hydronephrosis, no nephrolithiasis.  Assessment & Plan:   In summary, Clifford Alexander is a healthy 52-year-old male with incidental finding of a 1.5 cm right simple cyst, and left 8 mm angiomyolipoma.  He is completely asymptomatic.  We had a long discussion about these findings.  I reassured him that these are not at risk for malignancy.  We did discuss that AML's if they enlarge significantly (greater than 4 to 5 cm they can be at some risk for bleeding, however his is extremely small and less than 1 cm in size.  Trial of Flomax for his mild urinary symptoms.  Continue yearly PSA   screening.  Follow-up in 1 year for renal ultrasound to evaluate for change in size of left AML  Return in about 1 year (around 02/09/2019) for renal ultrasound.  Brian C Sninsky, MD  [facility] [address], [address] [place], [place] [address] [phone]   

## 2018-03-15 ENCOUNTER — Encounter: Payer: Managed Care, Other (non HMO) | Admitting: Internal Medicine

## 2018-03-29 ENCOUNTER — Encounter: Payer: Self-pay | Admitting: Internal Medicine

## 2018-03-29 ENCOUNTER — Ambulatory Visit (INDEPENDENT_AMBULATORY_CARE_PROVIDER_SITE_OTHER): Payer: Managed Care, Other (non HMO) | Admitting: Internal Medicine

## 2018-03-29 VITALS — BP 128/82 | HR 67 | Temp 98.1°F | Ht 76.0 in | Wt 258.2 lb

## 2018-03-29 DIAGNOSIS — E559 Vitamin D deficiency, unspecified: Secondary | ICD-10-CM

## 2018-03-29 DIAGNOSIS — G4730 Sleep apnea, unspecified: Secondary | ICD-10-CM | POA: Diagnosis not present

## 2018-03-29 DIAGNOSIS — D696 Thrombocytopenia, unspecified: Secondary | ICD-10-CM

## 2018-03-29 DIAGNOSIS — E785 Hyperlipidemia, unspecified: Secondary | ICD-10-CM | POA: Diagnosis not present

## 2018-03-29 DIAGNOSIS — R739 Hyperglycemia, unspecified: Secondary | ICD-10-CM | POA: Diagnosis not present

## 2018-03-29 DIAGNOSIS — Z0000 Encounter for general adult medical examination without abnormal findings: Secondary | ICD-10-CM

## 2018-03-29 DIAGNOSIS — I10 Essential (primary) hypertension: Secondary | ICD-10-CM

## 2018-03-29 LAB — CBC WITH DIFFERENTIAL/PLATELET
BASOS ABS: 0 10*3/uL (ref 0.0–0.1)
BASOS PCT: 0.5 % (ref 0.0–3.0)
Eosinophils Absolute: 0.1 10*3/uL (ref 0.0–0.7)
Eosinophils Relative: 1.4 % (ref 0.0–5.0)
HCT: 45.2 % (ref 39.0–52.0)
Hemoglobin: 15.3 g/dL (ref 13.0–17.0)
LYMPHS PCT: 33.7 % (ref 12.0–46.0)
Lymphs Abs: 1.5 10*3/uL (ref 0.7–4.0)
MCHC: 33.8 g/dL (ref 30.0–36.0)
MCV: 92.5 fl (ref 78.0–100.0)
MONOS PCT: 8.4 % (ref 3.0–12.0)
Monocytes Absolute: 0.4 10*3/uL (ref 0.1–1.0)
NEUTROS ABS: 2.5 10*3/uL (ref 1.4–7.7)
Neutrophils Relative %: 56 % (ref 43.0–77.0)
PLATELETS: 143 10*3/uL — AB (ref 150.0–400.0)
RBC: 4.89 Mil/uL (ref 4.22–5.81)
RDW: 12.8 % (ref 11.5–15.5)
WBC: 4.4 10*3/uL (ref 4.0–10.5)

## 2018-03-29 LAB — HEPATIC FUNCTION PANEL
ALT: 15 U/L (ref 0–53)
AST: 14 U/L (ref 0–37)
Albumin: 4.5 g/dL (ref 3.5–5.2)
Alkaline Phosphatase: 54 U/L (ref 39–117)
BILIRUBIN DIRECT: 0.1 mg/dL (ref 0.0–0.3)
TOTAL PROTEIN: 6.4 g/dL (ref 6.0–8.3)
Total Bilirubin: 0.8 mg/dL (ref 0.2–1.2)

## 2018-03-29 LAB — LIPID PANEL
CHOL/HDL RATIO: 4
Cholesterol: 187 mg/dL (ref 0–200)
HDL: 44.4 mg/dL (ref >39.00)
LDL Cholesterol: 126 mg/dL — ABNORMAL HIGH (ref 0–99)
NonHDL: 142.55
Triglycerides: 83 mg/dL (ref 0.0–149.0)
VLDL: 16.6 mg/dL (ref 0.0–40.0)

## 2018-03-29 LAB — BASIC METABOLIC PANEL
BUN: 14 mg/dL (ref 6–23)
CALCIUM: 9.4 mg/dL (ref 8.4–10.5)
CHLORIDE: 105 meq/L (ref 96–112)
CO2: 29 mEq/L (ref 19–32)
CREATININE: 1.18 mg/dL (ref 0.40–1.50)
GFR: 68.3 mL/min (ref >60.00)
Glucose, Bld: 91 mg/dL (ref 70–99)
Potassium: 4.3 mEq/L (ref 3.5–5.1)
Sodium: 140 mEq/L (ref 135–145)

## 2018-03-29 LAB — HEMOGLOBIN A1C: HEMOGLOBIN A1C: 5.4 % (ref 4.6–6.5)

## 2018-03-29 NOTE — Progress Notes (Signed)
Patient ID: Clifford Alexander, male   DOB: Jan 18, 1966, 52 y.o.   MRN: [id number]   Subjective:    Patient ID: Clifford Alexander, male    DOB: 05-26-1965, 52 y.o.   MRN: [id number]  HPI  Patient here for his physical exam.  Saw urology 02/08/18 for evaluation of renal cyst and angiomyolipoma.  Note reviewed.  Recommended f/u in one year for f/u renal ultrasound.  Also saw Dr Manuella Ghazi 11/2017.  Recommended continuing B12, vitamin D3 and alpha lipoic acid.  Sleep study with mild to moderate sleep apnea.  Using cpap.  Discussed using nasacort nasal spray to help with nasal congestion.  No chest pain.  No sob.  No acid reflux.  No abdominal pain.  Bowels moving.  Some occasional constipation.  Discussed miralax.     Past Medical History:  Diagnosis Date  . Bradycardia   . Fatigue 06/23/2008  . Hyperlipidemia   . Hypertension   . Insomnia 04/19/2007  . Shingles 01/2016  . Sleep apnea    Past Surgical History:  Procedure Laterality Date  . LUMBAR LAMINECTOMY  04/22/2007   L4-L5   discectomy 03/2008   Family History  Problem Relation Age of Onset  . Hyperthyroidism Mother   . Kidney disease Mother   . Hypertension Father   . Prostate cancer Paternal Grandfather    Social History   Socioeconomic History  . Marital status: Married    Spouse name: Not on file  . Number of children: Not on file  . Years of education: Not on file  . Highest education level: Not on file  Occupational History  . Not on file  Social Needs  . Financial resource strain: Not on file  . Food insecurity:    Worry: Not on file    Inability: Not on file  . Transportation needs:    Medical: Not on file    Non-medical: Not on file  Tobacco Use  . Smoking status: Former Smoker    Packs/day: 1.00    Years: 10.00    Pack years: 10.00    Last attempt to quit: 04/18/1992    Years since quitting: 25.9  . Smokeless tobacco: Never Used  . Tobacco comment: quit in 1994  Substance and Sexual Activity  . Alcohol use: No   Alcohol/week: 0.0 standard drinks  . Drug use: No  . Sexual activity: Not on file  Lifestyle  . Physical activity:    Days per week: Not on file    Minutes per session: Not on file  . Stress: Not on file  Relationships  . Social connections:    Talks on phone: Not on file    Gets together: Not on file    Attends religious service: Not on file    Active member of club or organization: Not on file    Attends meetings of clubs or organizations: Not on file    Relationship status: Not on file  Other Topics Concern  . Not on file  Social History Narrative  . Not on file    Outpatient Encounter Medications as of 03/29/2018  Medication Sig  . amLODipine (NORVASC) 5 MG tablet Take 1 tablet (5 mg total) by mouth daily.  Marland Kitchen b complex vitamins tablet Take 1 tablet by mouth daily.  . cholecalciferol (VITAMIN D) 400 units TABS tablet Take 400 Units by mouth.  . co-enzyme Q-10 30 MG capsule Take 30 mg by mouth 3 (three) times daily.  Marland Kitchen MAGNESIUM OXIDE PO Take 100  mg by mouth.  . omega-3 acid ethyl esters (LOVAZA) 1 G capsule Take by mouth 2 (two) times daily.  Marland Kitchen PREBIOTIC PRODUCT PO Take by mouth.  . tamsulosin (FLOMAX) 0.4 MG CAPS capsule Take 1 capsule (0.4 mg total) by mouth daily.  . Turmeric 500 MG CAPS Take 500 mg by mouth 2 (two) times daily.  Marland Kitchen VITAMIN K PO Take by mouth.   No facility-administered encounter medications on file as of 03/29/2018.     Review of Systems  Constitutional: Negative for appetite change and unexpected weight change.  HENT: Negative for congestion and sinus pressure.   Eyes: Negative for pain and visual disturbance.  Respiratory: Negative for cough, chest tightness and shortness of breath.   Cardiovascular: Negative for chest pain, palpitations and leg swelling.  Gastrointestinal: Negative for abdominal pain, diarrhea, nausea and vomiting.       Some occasional constipation.    Genitourinary: Negative for difficulty urinating and dysuria.    Musculoskeletal: Negative for joint swelling and myalgias.  Skin: Negative for color change and rash.  Neurological: Negative for dizziness and headaches.  Hematological: Negative for adenopathy. Does not bruise/bleed easily.  Psychiatric/Behavioral: Negative for agitation and dysphoric mood.       Objective:     Blood pressure rechecked by me:  128/82  Physical Exam Constitutional:      General: He is not in acute distress.    Appearance: Normal appearance. He is well-developed.  HENT:     Head: Normocephalic and atraumatic.     Nose: Nose normal. No congestion.     Mouth/Throat:     Pharynx: No oropharyngeal exudate or posterior oropharyngeal erythema.  Eyes:     General:        Right eye: No discharge.        Left eye: No discharge.     Conjunctiva/sclera: Conjunctivae normal.  Neck:     Musculoskeletal: Neck supple. No muscular tenderness.     Thyroid: No thyromegaly.  Cardiovascular:     Rate and Rhythm: Normal rate and regular rhythm.  Pulmonary:     Effort: No respiratory distress.     Breath sounds: Normal breath sounds. No wheezing.  Abdominal:     General: Bowel sounds are normal.     Palpations: Abdomen is soft.     Tenderness: There is no abdominal tenderness.  Genitourinary:    Comments: Not performed.   Musculoskeletal:        General: No swelling or tenderness.  Lymphadenopathy:     Cervical: No cervical adenopathy.  Skin:    Findings: No erythema or rash.  Neurological:     Mental Status: He is alert and oriented to person, place, and time.  Psychiatric:        Mood and Affect: Mood normal.        Behavior: Behavior normal.     BP 128/82   Pulse 67   Temp 98.1 F (36.7 C) (Oral)   Ht _0  (1.93 m)   Wt 258 lb 3.2 oz (117.1 kg)   SpO2 98%   BMI 31.43 kg/m  Wt Readings from Last 3 Encounters:  03/29/18 258 lb 3.2 oz (117.1 kg)  02/08/18 258 lb (117 kg)  11/02/17 255 lb (115.7 kg)     Lab Results  Component Value Date   WBC 4.4  03/29/2018   HGB 15.3 03/29/2018   HCT 45.2 03/29/2018   PLT 143.0 (L) 03/29/2018   GLUCOSE 91 03/29/2018   CHOL 187  03/29/2018   TRIG 83.0 03/29/2018   HDL 44.40 03/29/2018   LDLCALC 126 (H) 03/29/2018   ALT 15 03/29/2018   AST 14 03/29/2018   NA 140 03/29/2018   K 4.3 03/29/2018   CL 105 03/29/2018   CREATININE 1.18 03/29/2018   BUN 14 03/29/2018   CO2 29 03/29/2018   TSH 1.57 10/26/2017   PSA 0.65 10/26/2017   HGBA1C 5.4 03/29/2018    Ct Abdomen Pelvis W Contrast  Result Date: 07/27/2017 CLINICAL DATA:  Abdominal pain EXAM: CT ABDOMEN AND PELVIS WITH CONTRAST TECHNIQUE: Multidetector CT imaging of the abdomen and pelvis was performed using the standard protocol following bolus administration of intravenous contrast. Oral contrast was also administered. CONTRAST:  155m ISOVUE-300 IOPAMIDOL (ISOVUE-300) INJECTION 61% COMPARISON:  None. FINDINGS: Lower chest: Lung bases are clear. Hepatobiliary: No focal liver lesions are apparent. Gallbladder wall is not appreciably thickened. There is no biliary duct dilatation. Pancreas: There is no pancreatic mass or inflammatory focus. Spleen: No splenic lesions are evident. Adrenals/Urinary Tract: Adrenals bilaterally appear unremarkable. There is a cyst arising from the lateral right kidney measuring 1.6 x 1.5 cm. 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 There is no evident hydronephrosis on either side. There is no appreciable renal or ureteral calculus on either side. Urinary bladder is midline with wall thickness within normal limits. Stomach/Bowel: There is moderate stool throughout the colon. There is no appreciable bowel wall or mesenteric thickening. There is no evident bowel obstruction. No free air or portal venous air. Vascular/Lymphatic: There is atherosclerotic calcification in the aorta. No aneurysm. Major mesenteric vessels appear patent. There is no appreciable adenopathy in the abdomen or pelvis.  Reproductive: Prostate and seminal vesicles appear normal in size and contour. No evident pelvic mass. Other: Appendix appears normal. There is no abscess or ascites in the abdomen or pelvis. There is a rather minimal ventral hernia containing only fat. Musculoskeletal: There is degenerative change in the lumbar spine. There is moderate spinal stenosis at L3-4 and L4-5 due to bony hypertrophy as well as disc protrusion. No evident blastic or lytic bone lesions. There is no intramuscular or abdominal wall lesions. IMPRESSION: 1.  Moderate spinal stenosis at L3-4 and L4-5, multifactorial. 2. Moderate stool throughout colon. No bowel obstruction. No abscess. Appendix appears normal. 3.  No renal or ureteral calculus.  No hydronephrosis. 4.  Aortic atherosclerosis. 5.  Minimal ventral hernia containing only fat. Aortic Atherosclerosis (ICD10-I70.0). Electronically Signed   By: WLowella GripIII M.D.   On: 07/27/2017 16:26       Assessment & Plan:   Problem List Items Addressed This Visit    Benign essential HTN    Blood pressure on recheck improved.  Continue same medication regimen.  Follow pressures.  Follow metabolic panel.        Relevant Orders   Basic metabolic panel (Completed)   Health care maintenance    Physical today 03/29/18.  Check psa.  Colonoscopy 10/26/17 as outlined.        Hyperglycemia    Low carb diet and exercise.  Follow met b and a1c.        Relevant Orders   Hemoglobin A1c (Completed)   Hyperlipidemia    Low cholesterol diet and exercise.  Follow lipid panel.      Relevant Orders   Hepatic function panel (Completed)   Lipid panel (Completed)   Sleep apnea    Using cpap regularly.  Vitamin D deficiency    Follow vitamin D level.         Other Visit Diagnoses    Routine general medical examination at a health care facility    -  Primary   Thrombocytopenia ([place])       Relevant Orders   CBC with Differential/Platelet (Completed)       Einar Pheasant, MD

## 2018-03-29 NOTE — Progress Notes (Signed)
Pre visit review using our clinic review tool, if applicable. No additional management support is needed unless otherwise documented below in the visit note. 

## 2018-03-29 NOTE — Assessment & Plan Note (Signed)
Physical today 03/29/18.  Check psa.  Colonoscopy 10/26/17 as outlined.

## 2018-03-31 ENCOUNTER — Encounter: Payer: Self-pay | Admitting: Internal Medicine

## 2018-03-31 NOTE — Assessment & Plan Note (Signed)
Blood pressure on recheck improved.  Continue same medication regimen.  Follow pressures.  Follow metabolic panel.   

## 2018-03-31 NOTE — Assessment & Plan Note (Signed)
Low carb diet and exercise.  Follow met b and a1c.

## 2018-03-31 NOTE — Assessment & Plan Note (Signed)
Low cholesterol diet and exercise.  Follow lipid panel.   

## 2018-03-31 NOTE — Assessment & Plan Note (Signed)
Follow vitamin D level.  

## 2018-03-31 NOTE — Assessment & Plan Note (Signed)
Using cpap regularly   

## 2018-08-27 ENCOUNTER — Ambulatory Visit: Payer: Self-pay | Admitting: Internal Medicine

## 2018-08-27 NOTE — Telephone Encounter (Signed)
  Pt called in from [place].   He has a wound on his left leg that he was concerned about.   See triage notes.    I instructed him to go to a local urgent care center if the wound starts draining pus, getting increased redness, soreness or he develops a fever. I also suggested he put some antibiotic cream on it and cover it with a band aid.   I have some in my bag, I will do that. I instructed him to call back if he gets back into town and it's not getting better and we'll get him in to see Dr. Nicki Reaper.  He was agreeable to this plan.   Reason for Disposition . Wound doesn't sound infected  Answer Assessment - Initial Assessment Questions 1. LOCATION: "Where is the wound located?"      Left 2inches from knee where muscle meets at knee. 2. WOUND APPEARANCE: "What does the wound look like?"      It's a dark red but has yellow around it about 2 inches.  This is the first I noticed it.   I hurt myself 4-5 days ago.   I was replacing boards on my deck.   I scratched it I think. 3. SIZE: If redness is present, ask: "What is the size of the red area?" (Inches, centimeters, or compare to size of a coin)      Size 3-4 inches around 4. SPREAD: "What's changed in the last day?"  "Do you see any red streaks coming from the wound?"     It looks better than today. 5. ONSET: "When did it start to look infected?"      The yellow I noticed this morning.   No drainage 6. MECHANISM: "How did the wound start, what was the cause?"     See above 7. PAIN: "Is there any pain?" If so, ask: "How bad is the pain?"   (Scale 1-10; or mild, moderate, severe)     No pain 8. FEVER: "Do you have a fever?" If so, ask: "What is your temperature, how was it measured, and when did it start?"     No 9. OTHER SYMPTOMS: "Do you have any other symptoms?" (e.g., shaking chills, weakness, rash elsewhere on body)     No 10. PREGNANCY: "Is there any chance you are pregnant?" "When was your last menstrual period?"       N/A    You  are in [place] now.  Protocols used: WOUND INFECTION-A-AH

## 2018-09-13 ENCOUNTER — Other Ambulatory Visit: Payer: Self-pay | Admitting: Gastroenterology

## 2018-09-13 DIAGNOSIS — R1031 Right lower quadrant pain: Secondary | ICD-10-CM

## 2018-09-13 DIAGNOSIS — G8929 Other chronic pain: Secondary | ICD-10-CM

## 2018-09-27 ENCOUNTER — Other Ambulatory Visit: Payer: Self-pay

## 2018-09-27 ENCOUNTER — Ambulatory Visit
Admission: RE | Admit: 2018-09-27 | Discharge: 2018-09-27 | Disposition: A | Discharge status: Home / Self Care | Payer: Managed Care, Other (non HMO) | Source: Ambulatory Visit | Attending: Gastroenterology | Admitting: Gastroenterology

## 2018-09-27 DIAGNOSIS — R1031 Right lower quadrant pain: Secondary | ICD-10-CM | POA: Diagnosis not present

## 2018-09-27 DIAGNOSIS — G8929 Other chronic pain: Secondary | ICD-10-CM | POA: Insufficient documentation

## 2018-09-27 MED ORDER — IOHEXOL 300 MG/ML  SOLN
100.0000 mL | Freq: Once | INTRAMUSCULAR | Status: AC | PRN
  Administered 2018-09-27: 100 mL via INTRAVENOUS

## 2018-10-04 ENCOUNTER — Ambulatory Visit (INDEPENDENT_AMBULATORY_CARE_PROVIDER_SITE_OTHER): Payer: Managed Care, Other (non HMO) | Admitting: Internal Medicine

## 2018-10-04 ENCOUNTER — Other Ambulatory Visit: Payer: Self-pay

## 2018-10-04 DIAGNOSIS — D696 Thrombocytopenia, unspecified: Secondary | ICD-10-CM

## 2018-10-04 DIAGNOSIS — R739 Hyperglycemia, unspecified: Secondary | ICD-10-CM

## 2018-10-04 DIAGNOSIS — Z125 Encounter for screening for malignant neoplasm of prostate: Secondary | ICD-10-CM

## 2018-10-04 DIAGNOSIS — G4730 Sleep apnea, unspecified: Secondary | ICD-10-CM

## 2018-10-04 DIAGNOSIS — R1031 Right lower quadrant pain: Secondary | ICD-10-CM

## 2018-10-04 DIAGNOSIS — I10 Essential (primary) hypertension: Secondary | ICD-10-CM | POA: Diagnosis not present

## 2018-10-04 DIAGNOSIS — E785 Hyperlipidemia, unspecified: Secondary | ICD-10-CM

## 2018-10-04 DIAGNOSIS — K219 Gastro-esophageal reflux disease without esophagitis: Secondary | ICD-10-CM

## 2018-10-04 DIAGNOSIS — N281 Cyst of kidney, acquired: Secondary | ICD-10-CM

## 2018-10-04 NOTE — Progress Notes (Signed)
Patient ID: Clifford Alexander, male   DOB: December 19, 1965, 53 y.o.   MRN: [id number]   Virtual Visit via video Note  This visit type was conducted due to national recommendations for restrictions regarding the COVID-19 pandemic (e.g. social distancing).  This format is felt to be most appropriate for this patient at this time.  All issues noted in this document were discussed and addressed.  No physical exam was performed (except for noted visual exam findings with Video Visits).   I connected with Clifford Alexander by a video enabled telemedicine application or telephone and verified that I am speaking with the correct person using two identifiers. Location patient: home Location provider: work Persons participating in the virtual visit: patient, provider  I discussed the limitations, risks, security and privacy concerns of performing an evaluation and management service by video and the availability of in person appointments. The patient expressed understanding and agreed to proceed.   Reason for visit: scheduled follow up.    HPI: He reports he is doing better.  Has been seeing GI.  Last evaluated 09/13/18.  Has had chronic right lower quadrant pain.  Had CT - fatty liver and spinal stenosis with minimal ventral hernia.  Taking miralax.  Taking probiotic.  Has helped. Bowels moving.  No significant pain.  Has not had to take hyoscyamine.  Is taking protonix.  Upper symptoms controlled.  Had colonoscopy 2019 - unremarkable.  Saw urology for f/u renal cyst/angiomyolipoma.  Felt stable and recommended f/u renal ultrasound in one year.  Using cpap.  Sleeping well.  States blood pressure doing well.  No chest pain.  No sob.  No acid reflux.  No abdominal pain.     ROS: See pertinent positives and negatives per HPI.  Past Medical History:  Diagnosis Date  . Bradycardia   . Fatigue 06/23/2008  . Hyperlipidemia   . Hypertension   . Insomnia 04/19/2007  . Shingles 01/2016  . Sleep apnea     Past  Surgical History:  Procedure Laterality Date  . LUMBAR LAMINECTOMY  04/22/2007   L4-L5   discectomy 03/2008    Family History  Problem Relation Age of Onset  . Hyperthyroidism Mother   . Kidney disease Mother   . Hypertension Father   . Prostate cancer Paternal Grandfather     SOCIAL HX: reviewed.    Current Outpatient Medications:  .  amLODipine (NORVASC) 5 MG tablet, Take 1 tablet (5 mg total) by mouth daily., Disp: 90 tablet, Rfl: 3 .  b complex vitamins tablet, Take 1 tablet by mouth daily., Disp: , Rfl:  .  cholecalciferol (VITAMIN D) 400 units TABS tablet, Take 400 Units by mouth., Disp: , Rfl:  .  co-enzyme Q-10 30 MG capsule, Take 30 mg by mouth 3 (three) times daily., Disp: , Rfl:  .  MAGNESIUM OXIDE PO, Take 100 mg by mouth., Disp: , Rfl:  .  omega-3 acid ethyl esters (LOVAZA) 1 G capsule, Take by mouth 2 (two) times daily., Disp: , Rfl:  .  PREBIOTIC PRODUCT PO, Take by mouth., Disp: , Rfl:  .  tamsulosin (FLOMAX) 0.4 MG CAPS capsule, Take 1 capsule (0.4 mg total) by mouth daily., Disp: 30 capsule, Rfl: 11 .  Turmeric 500 MG CAPS, Take 500 mg by mouth 2 (two) times daily., Disp: , Rfl:  .  VITAMIN K PO, Take by mouth., Disp: , Rfl:   EXAM:  VITALS per patient if applicable: 937/16  GENERAL: alert, oriented, appears well and in no  acute distress  HEENT: atraumatic, conjunttiva clear, no obvious abnormalities on inspection of external nose and ears  NECK: normal movements of the head and neck  LUNGS: on inspection no signs of respiratory distress, breathing rate appears normal, no obvious gross SOB, gasping or wheezing  CV: no obvious cyanosis  PSYCH/NEURO: pleasant and cooperative, no obvious depression or anxiety, speech and thought processing grossly intact  ASSESSMENT AND PLAN:  Discussed the following assessment and plan:  Abdominal pain CT as outlined.  Seeing GI.  Taking miralax, protonix and probiotic.  Doing better.  No significant pain.  Bowels  moving.  Follow.  Colonoscopy 2019 - unremarkable.   Benign essential HTN Blood pressure as outlined.  On amlodipine.  Follow pressures.  Follow metabolic panel.    GERD (gastroesophageal reflux disease) Doing well on protonix.  Follow.    Hyperglycemia Low carb diet and exercise.  Follow met b and a1c.    Hyperlipidemia Low cholesterol diet and exercise.  Follow lipid panel.    Sleep apnea Using cpap.  Follow    Thrombocytopenia (HCC) Slightly decreased platelet count.  Recheck cbc with next labs.    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  Stable.  Recommended f/u in one year.      I discussed the assessment and treatment plan with the patient. The patient was provided an opportunity to ask questions and all were answered. The patient agreed with the plan and demonstrated an understanding of the instructions.   The patient was advised to call back or seek an in-person evaluation if the symptoms worsen or if the condition fails to improve as anticipated.   Einar Pheasant, MD

## 2018-10-09 ENCOUNTER — Encounter: Payer: Self-pay | Admitting: Internal Medicine

## 2018-10-09 DIAGNOSIS — D696 Thrombocytopenia, unspecified: Secondary | ICD-10-CM | POA: Insufficient documentation

## 2018-10-09 DIAGNOSIS — N281 Cyst of kidney, acquired: Secondary | ICD-10-CM | POA: Insufficient documentation

## 2018-10-09 NOTE — Assessment & Plan Note (Signed)
Doing well on protonix.  Follow.

## 2018-10-09 NOTE — Assessment & Plan Note (Signed)
Blood pressure as outlined.  On amlodipine.  Follow pressures.  Follow metabolic panel.  

## 2018-10-09 NOTE — Assessment & Plan Note (Signed)
Slightly decreased platelet count.  Recheck cbc with next labs.   

## 2018-10-09 NOTE — Assessment & Plan Note (Signed)
Low cholesterol diet and exercise.  Follow lipid panel.   

## 2018-10-09 NOTE — Assessment & Plan Note (Addendum)
CT as outlined.  Seeing GI.  Taking miralax, protonix and probiotic.  Doing better.  No significant pain.  Bowels moving.  Follow.  Colonoscopy 2019 - unremarkable.

## 2018-10-09 NOTE — Assessment & Plan Note (Signed)
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  Stable.  Recommended f/u in one year.

## 2018-10-09 NOTE — Assessment & Plan Note (Signed)
Using cpap.  Follow.  

## 2018-10-09 NOTE — Assessment & Plan Note (Signed)
Low carb diet and exercise.  Follow met b and a1c.   

## 2018-10-25 ENCOUNTER — Other Ambulatory Visit (INDEPENDENT_AMBULATORY_CARE_PROVIDER_SITE_OTHER): Payer: Managed Care, Other (non HMO)

## 2018-10-25 ENCOUNTER — Other Ambulatory Visit: Payer: Self-pay

## 2018-10-25 DIAGNOSIS — E785 Hyperlipidemia, unspecified: Secondary | ICD-10-CM

## 2018-10-25 DIAGNOSIS — D696 Thrombocytopenia, unspecified: Secondary | ICD-10-CM | POA: Diagnosis not present

## 2018-10-25 DIAGNOSIS — Z125 Encounter for screening for malignant neoplasm of prostate: Secondary | ICD-10-CM | POA: Diagnosis not present

## 2018-10-25 DIAGNOSIS — R739 Hyperglycemia, unspecified: Secondary | ICD-10-CM

## 2018-10-25 DIAGNOSIS — I10 Essential (primary) hypertension: Secondary | ICD-10-CM | POA: Diagnosis not present

## 2018-10-25 LAB — URINALYSIS, ROUTINE W REFLEX MICROSCOPIC
Bilirubin Urine: NEGATIVE
Hgb urine dipstick: NEGATIVE
Ketones, ur: NEGATIVE
Leukocytes,Ua: NEGATIVE
Nitrite: NEGATIVE
RBC / HPF: NONE SEEN (ref 0–?)
Specific Gravity, Urine: 1.025 (ref 1.000–1.030)
Total Protein, Urine: NEGATIVE
Urine Glucose: NEGATIVE
Urobilinogen, UA: 0.2 (ref 0.0–1.0)
pH: 6.5 (ref 5.0–8.0)

## 2018-10-25 LAB — CBC WITH DIFFERENTIAL/PLATELET
Basophils Absolute: 0 10*3/uL (ref 0.0–0.1)
Basophils Relative: 0.3 % (ref 0.0–3.0)
Eosinophils Absolute: 0.1 10*3/uL (ref 0.0–0.7)
Eosinophils Relative: 1.4 % (ref 0.0–5.0)
HCT: 45.7 % (ref 39.0–52.0)
Hemoglobin: 15.1 g/dL (ref 13.0–17.0)
Lymphocytes Relative: 30.7 % (ref 12.0–46.0)
Lymphs Abs: 1.5 10*3/uL (ref 0.7–4.0)
MCHC: 33 g/dL (ref 30.0–36.0)
MCV: 93.7 fl (ref 78.0–100.0)
Monocytes Absolute: 0.5 10*3/uL (ref 0.1–1.0)
Monocytes Relative: 9.5 % (ref 3.0–12.0)
Neutro Abs: 2.9 10*3/uL (ref 1.4–7.7)
Neutrophils Relative %: 58.1 % (ref 43.0–77.0)
Platelets: 138 10*3/uL — ABNORMAL LOW (ref 150.0–400.0)
RBC: 4.87 Mil/uL (ref 4.22–5.81)
RDW: 12.8 % (ref 11.5–15.5)
WBC: 4.9 10*3/uL (ref 4.0–10.5)

## 2018-10-25 LAB — PSA: PSA: 0.55 ng/mL (ref 0.10–4.00)

## 2018-10-25 LAB — BASIC METABOLIC PANEL
BUN: 19 mg/dL (ref 6–23)
CO2: 28 mEq/L (ref 19–32)
Calcium: 9.4 mg/dL (ref 8.4–10.5)
Chloride: 106 mEq/L (ref 96–112)
Creatinine, Ser: 1.3 mg/dL (ref 0.40–1.50)
GFR: 57.34 mL/min — ABNORMAL LOW (ref >60.00)
Glucose, Bld: 99 mg/dL (ref 70–99)
Potassium: 3.9 mEq/L (ref 3.5–5.1)
Sodium: 142 mEq/L (ref 135–145)

## 2018-10-25 LAB — LIPID PANEL
Cholesterol: 182 mg/dL (ref 0–200)
HDL: 41.2 mg/dL (ref >39.00)
LDL Cholesterol: 124 mg/dL — ABNORMAL HIGH (ref 0–99)
NonHDL: 140.32
Total CHOL/HDL Ratio: 4
Triglycerides: 83 mg/dL (ref 0.0–149.0)
VLDL: 16.6 mg/dL (ref 0.0–40.0)

## 2018-10-25 LAB — HEPATIC FUNCTION PANEL
ALT: 12 U/L (ref 0–53)
AST: 14 U/L (ref 0–37)
Albumin: 4.4 g/dL (ref 3.5–5.2)
Alkaline Phosphatase: 56 U/L (ref 39–117)
Bilirubin, Direct: 0.2 mg/dL (ref 0.0–0.3)
Total Bilirubin: 1 mg/dL (ref 0.2–1.2)
Total Protein: 6.5 g/dL (ref 6.0–8.3)

## 2018-10-25 LAB — HEMOGLOBIN A1C: Hgb A1c MFr Bld: 5.5 % (ref 4.6–6.5)

## 2018-10-26 ENCOUNTER — Other Ambulatory Visit: Payer: Self-pay | Admitting: Internal Medicine

## 2018-10-26 DIAGNOSIS — D696 Thrombocytopenia, unspecified: Secondary | ICD-10-CM

## 2018-10-26 NOTE — Progress Notes (Signed)
Order placed for f/u platelt count.

## 2018-11-01 ENCOUNTER — Other Ambulatory Visit: Payer: Self-pay

## 2018-11-01 ENCOUNTER — Encounter: Payer: Self-pay | Admitting: Oncology

## 2018-11-01 ENCOUNTER — Inpatient Hospital Stay: Payer: Managed Care, Other (non HMO) | Attending: Oncology | Admitting: Oncology

## 2018-11-01 VITALS — BP 128/92 | HR 63 | Temp 97.3°F | Resp 18 | Ht 76.0 in | Wt 250.9 lb

## 2018-11-01 DIAGNOSIS — I10 Essential (primary) hypertension: Secondary | ICD-10-CM

## 2018-11-01 DIAGNOSIS — D696 Thrombocytopenia, unspecified: Secondary | ICD-10-CM

## 2018-11-01 NOTE — Progress Notes (Signed)
Pt new pt with dec. Plt. Count is 100's. No sx

## 2018-11-01 NOTE — Progress Notes (Signed)
Hematology/Oncology Consult note [facility] Telephone:([phone] [phone]  Patient Care Team: Einar Pheasant, MD as PCP - General (Internal Medicine)   Name of the patient: Clifford Alexander  [id number]  May 31, 1965    Reason for referral-thrombocytopenia   Referring physician-Dr. Einar Pheasant  Date of visit: 11/01/18   History of presenting illness-patient is a 53 year old Caucasian male with a past medical history significant for hypertension who has been referred to [place] for thrombocytopenia.His most recent CBC from 10/25/2018 showed white count of 4.9, H&H of 15.1/45.7 and a platelet count of 138.  Of note patient has had mild thrombocytopenia dating back to April 2019 and his platelet counts fluctuate between 130s to 140s.  His hemoglobin and white count have always been normal.  Patient denies any history of bleeding or bruising.  He is currently on B12 supplements.  He is a Administrator by profession.  His appetite and weight have been stable.  Denies any joint pain or skin rash.  ECOG PS- 0  Pain scale- 0   Review of systems- Review of Systems  Constitutional: Negative for chills, fever, malaise/fatigue and weight loss.  HENT: Negative for congestion, ear discharge and nosebleeds.   Eyes: Negative for blurred vision.  Respiratory: Negative for cough, hemoptysis, sputum production, shortness of breath and wheezing.   Cardiovascular: Negative for chest pain, palpitations, orthopnea and claudication.  Gastrointestinal: Negative for abdominal pain, blood in stool, constipation, diarrhea, heartburn, melena, nausea and vomiting.  Genitourinary: Negative for dysuria, flank pain, frequency, hematuria and urgency.  Musculoskeletal: Negative for back pain, joint pain and myalgias.  Skin: Negative for rash.  Neurological: Positive for sensory change (Peripheral neuropathy). Negative for dizziness, tingling, focal weakness, seizures, weakness and  headaches.  Endo/Heme/Allergies: Does not bruise/bleed easily.  Psychiatric/Behavioral: Negative for depression and suicidal ideas. The patient does not have insomnia.     Allergies  Allergen Reactions  . Naproxen Shortness Of Breath  . Ciprofloxacin   . Ibuprofen     Patient Active Problem List   Diagnosis Date Noted  . Thrombocytopenia ([place]) 10/09/2018  . Renal cyst 10/09/2018  . Sleep apnea 09/26/2017  . Headache 09/26/2017  . Polyneuropathy 07/18/2017  . Abdominal pain 07/13/2017  . CTS (carpal tunnel syndrome) 02/05/2017  . Numbness in feet 10/27/2016  . Vitamin D deficiency 10/27/2016  . Health care maintenance 11/19/2015  . Acute bilateral low back pain without sciatica 07/18/2015  . Sinusitis, acute maxillary 07/18/2015  . History of hay fever 10/23/2014  . GERD (gastroesophageal reflux disease) 10/23/2014  . Cephalalgia 10/23/2014  . Dysmetabolic syndrome 53/97/6734  . Hyperglycemia 10/23/2014  . Hyperlipidemia 10/23/2014  . External thrombosed hemorrhoids 04/06/2008  . 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 Cannot sleep 04/19/2007  . Benign essential HTN 08/05/2006     Past Medical History:  Diagnosis Date  . Bradycardia   . CPAP (continuous positive airway pressure) dependence   . Fatigue 06/23/2008  . Hyperlipidemia   . Hypertension   . Insomnia 04/19/2007  . Neuromuscular disorder (HCC)    neuropathy in feet  . Shingles 01/2016  . Sleep apnea   . Thrombocytopenia ([place])      Past Surgical History:  Procedure Laterality Date  . LUMBAR LAMINECTOMY  04/22/2007   L4-L5   discectomy 03/2008    Social History   Socioeconomic History  . Marital status: Married    Spouse name: Not on file  . Number of children: Not on file  . Years  of education: Not on file  . Highest education level: Not on file  Occupational History  . Not on file  Social Needs  . Financial resource strain: Not on file  . Food insecurity     Worry: Not on file    Inability: Not on file  . Transportation needs    Medical: Not on file    Non-medical: Not on file  Tobacco Use  . Smoking status: Former Smoker    Packs/day: 1.00    Years: 10.00    Pack years: 10.00    Quit date: 04/18/1992    Years since quitting: 26.5  . Smokeless tobacco: Never Used  . Tobacco comment: quit in 1994  Substance and Sexual Activity  . Alcohol use: No    Alcohol/week: 0.0 standard drinks  . Drug use: No  . Sexual activity: Yes  Lifestyle  . Physical activity    Days per week: Not on file    Minutes per session: Not on file  . Stress: Not on file  Relationships  . Social Herbalist on phone: Not on file    Gets together: Not on file    Attends religious service: Not on file    Active member of club or organization: Not on file    Attends meetings of clubs or organizations: Not on file    Relationship status: Not on file  . Intimate partner violence    Fear of current or ex partner: Not on file    Emotionally abused: Not on file    Physically abused: Not on file    Forced sexual activity: Not on file  Other Topics Concern  . Not on file  Social History Narrative  . Not on file     Family History  Problem Relation Age of Onset  . Hyperthyroidism Mother   . Kidney disease Mother   . Hypertension Father   . Prostate cancer Paternal Grandfather      Current Outpatient Medications:  .  Alpha-Lipoic Acid 200 MG CAPS, Take 600 mg by mouth daily., Disp: , Rfl:  .  amLODipine (NORVASC) 5 MG tablet, Take 1 tablet (5 mg total) by mouth daily., Disp: 90 tablet, Rfl: 3 .  b complex vitamins tablet, Take 1 tablet by mouth daily., Disp: , Rfl:  .  Cholecalciferol (VITAMIN D3) 125 MCG (5000 UT) CAPS, Take 1 capsule by mouth daily., Disp: , Rfl:  .  co-enzyme Q-10 30 MG capsule, Take 100 mg by mouth daily. , Disp: , Rfl:  .  Ginkgo Biloba 40 MG TABS, Take 1 tablet by mouth daily., Disp: , Rfl:  .  MAGNESIUM OXIDE PO, Take 100 mg  by mouth daily. , Disp: , Rfl:  .  omega-3 acid ethyl esters (LOVAZA) 1 G capsule, Take by mouth 2 (two) times daily., Disp: , Rfl:  .  PREBIOTIC PRODUCT PO, Take 1 Dose by mouth daily. , Disp: , Rfl:  .  tamsulosin (FLOMAX) 0.4 MG CAPS capsule, Take 1 capsule (0.4 mg total) by mouth daily., Disp: 30 capsule, Rfl: 11 .  Turmeric 500 MG CAPS, Take 500 mg by mouth daily. , Disp: , Rfl:  .  vitamin B-12 (CYANOCOBALAMIN) 100 MCG tablet, Take 100 mcg by mouth daily., Disp: , Rfl:    Physical exam:  Vitals:   11/01/18 1033  BP: (!) 128/92  Pulse: 63  Resp: 18  Temp: (!) 97.3 F (36.3 C)  TempSrc: Tympanic  Weight: 250 lb 14.4 oz (  113.8 kg)  Height: 6' 4"  (1.93 m)   Physical Exam HENT:     Head: Normocephalic and atraumatic.  Eyes:     Pupils: Pupils are equal, round, and reactive to light.  Neck:     Musculoskeletal: Normal range of motion.  Cardiovascular:     Rate and Rhythm: Normal rate and regular rhythm.     Heart sounds: Normal heart sounds.  Pulmonary:     Effort: Pulmonary effort is normal.     Breath sounds: Normal breath sounds.  Abdominal:     General: Bowel sounds are normal.     Palpations: Abdomen is soft.     Comments: No palpable splenomegaly  Lymphadenopathy:     Comments: No palpable cervical, supraclavicular, axillary or inguinal adenopathy   Skin:    General: Skin is warm and dry.  Neurological:     Mental Status: He is alert and oriented to person, place, and time.        CMP Latest Ref Rng & Units 10/25/2018  Glucose 70 - 99 mg/dL 99  BUN 6 - 23 mg/dL 19  Creatinine 0.40 - 1.50 mg/dL 1.30  Sodium 135 - 145 mEq/L 142  Potassium 3.5 - 5.1 mEq/L 3.9  Chloride 96 - 112 mEq/L 106  CO2 19 - 32 mEq/L 28  Calcium 8.4 - 10.5 mg/dL 9.4  Total Protein 6.0 - 8.3 g/dL 6.5  Total Bilirubin 0.2 - 1.2 mg/dL 1.0  Alkaline Phos 39 - 117 U/L 56  AST 0 - 37 U/L 14  ALT 0 - 53 U/L 12   CBC Latest Ref Rng & Units 10/25/2018  WBC 4.0 - 10.5 K/uL 4.9   Hemoglobin 13.0 - 17.0 g/dL 15.1  Hematocrit 39.0 - 52.0 % 45.7  Platelets 150.0 - 400.0 K/uL 138.0(L)     Assessment and plan- Patient is a 53 y.o. male referred for mild isolated thrombocytopenia  Patient has mild isolated thrombocytopenia over the last 1 year.  Prior to that his platelet counts were normal.  His white count and hemoglobin have always been normal.  I do not suspect a primary bone marrow disorder causing his thrombocytopenia at this time.  I also reviewed his medication list and none of them cause any significant thrombocytopenia.  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  Since he recently had blood work on 10/25/2018 I will plan to get labs in 3 months time including a CBC with differential, CMP, B12, folate, smear review, HIV and hepatitis C testing as well as myeloma panel.  I will see him back after his labs in 3 months.  He does not require any treatment for his mild thrombocytopenia at this time.  Discussed what ITP is and the rationale for using steroids down the line if he develops severe thrombocytopenia with a platelet count of less than 30.  Patient verbalized understanding   Thank you for this kind referral and the opportunity to participate in the care of this patient   Visit Diagnosis 1. Thrombocytopenia ([place])     Dr. Randa Evens, MD, MPH [facility] [phone] 11/01/2018  11:25 AM

## 2018-11-29 ENCOUNTER — Other Ambulatory Visit: Payer: Self-pay | Admitting: Internal Medicine

## 2018-11-29 DIAGNOSIS — I10 Essential (primary) hypertension: Secondary | ICD-10-CM

## 2019-01-24 ENCOUNTER — Other Ambulatory Visit: Payer: Self-pay | Admitting: Internal Medicine

## 2019-01-24 DIAGNOSIS — I10 Essential (primary) hypertension: Secondary | ICD-10-CM

## 2019-01-31 ENCOUNTER — Inpatient Hospital Stay: Payer: Managed Care, Other (non HMO)

## 2019-01-31 ENCOUNTER — Other Ambulatory Visit: Payer: Self-pay

## 2019-01-31 ENCOUNTER — Inpatient Hospital Stay: Payer: Managed Care, Other (non HMO) | Admitting: Oncology

## 2019-01-31 ENCOUNTER — Ambulatory Visit
Admission: RE | Admit: 2019-01-31 | Discharge: 2019-01-31 | Disposition: A | Discharge status: Home / Self Care | Payer: Managed Care, Other (non HMO) | Source: Ambulatory Visit | Attending: Urology | Admitting: Urology

## 2019-01-31 DIAGNOSIS — N281 Cyst of kidney, acquired: Secondary | ICD-10-CM | POA: Diagnosis present

## 2019-02-01 ENCOUNTER — Other Ambulatory Visit: Payer: Managed Care, Other (non HMO)

## 2019-02-01 ENCOUNTER — Ambulatory Visit: Payer: Managed Care, Other (non HMO) | Admitting: Oncology

## 2019-02-07 ENCOUNTER — Encounter: Payer: Self-pay | Admitting: Urology

## 2019-02-07 ENCOUNTER — Ambulatory Visit: Payer: Managed Care, Other (non HMO) | Admitting: Urology

## 2019-02-07 ENCOUNTER — Other Ambulatory Visit: Payer: Self-pay

## 2019-02-07 VITALS — BP 159/92 | HR 77 | Ht 76.0 in | Wt 250.0 lb

## 2019-02-07 DIAGNOSIS — D1771 Benign lipomatous neoplasm of kidney: Secondary | ICD-10-CM | POA: Diagnosis not present

## 2019-02-07 DIAGNOSIS — N138 Other obstructive and reflux uropathy: Secondary | ICD-10-CM

## 2019-02-07 DIAGNOSIS — N401 Enlarged prostate with lower urinary tract symptoms: Secondary | ICD-10-CM

## 2019-02-07 MED ORDER — TAMSULOSIN HCL 0.4 MG PO CAPS: 0.4000 mg | ORAL_CAPSULE | Freq: Every day | ORAL | 11 refills | Status: DC

## 2019-02-07 NOTE — Patient Instructions (Signed)

## 2019-02-07 NOTE — Progress Notes (Signed)
   02/07/2019 11:23 AM   Magda Paganini 1966-01-22 [id number]  Reason for visit: Follow up AML, BPH  HPI: I saw Mr. Strouse back in urology clinic today.  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  He was asymptomatic with no flank pain or hematuria.  He also has some very mild urinary symptoms of weak stream and nocturia, and we started Flomax at our last visit.  He reports significant improvement in his urinary symptoms on the Flomax and is satisfied with his urinary symptoms currently.  He underwent a CT in June 2020 for abdominal pain and the left-sided small AML was not even visible.  He also went a scheduled follow-up renal ultrasound on 01/31/2019 that showed no evidence of the left renal angiomyolipoma.  Regarding his urinary symptoms, I recommended continuing Flomax, and we discussed behavioral strategies regarding nocturia.  We discussed the good news regarding his AML has either spontaneously regressed or is so small it cannot be seen on CT or ultrasound imaging.  We discussed that these are not malignant lesions, however can occasionally grow to a large size and be at risk for bleeding.  Follow-up in 1 year with repeat renal ultrasound, if no evidence of AML would discontinue further imaging at that time  A total of 15 minutes were spent face-to-face with the patient, greater than 50% was spent in patient education, counseling, and coordination of care regarding BPH and history of AML.  Billey Co, [facility] [address] [place], [place] [address] [phone]

## 2019-02-14 ENCOUNTER — Other Ambulatory Visit: Payer: Self-pay

## 2019-02-14 ENCOUNTER — Inpatient Hospital Stay: Payer: Managed Care, Other (non HMO)

## 2019-02-14 ENCOUNTER — Inpatient Hospital Stay: Payer: Managed Care, Other (non HMO) | Attending: Oncology | Admitting: Oncology

## 2019-02-14 ENCOUNTER — Ambulatory Visit: Payer: Managed Care, Other (non HMO) | Admitting: Internal Medicine

## 2019-02-14 VITALS — BP 130/83 | HR 77 | Temp 97.3°F | Ht 76.0 in | Wt 258.0 lb

## 2019-02-14 VITALS — BP 128/80 | HR 68 | Temp 97.4°F | Resp 16 | Wt 258.2 lb

## 2019-02-14 DIAGNOSIS — E785 Hyperlipidemia, unspecified: Secondary | ICD-10-CM | POA: Diagnosis not present

## 2019-02-14 DIAGNOSIS — R739 Hyperglycemia, unspecified: Secondary | ICD-10-CM | POA: Diagnosis not present

## 2019-02-14 DIAGNOSIS — G4730 Sleep apnea, unspecified: Secondary | ICD-10-CM

## 2019-02-14 DIAGNOSIS — D696 Thrombocytopenia, unspecified: Secondary | ICD-10-CM

## 2019-02-14 DIAGNOSIS — Z87891 Personal history of nicotine dependence: Secondary | ICD-10-CM | POA: Insufficient documentation

## 2019-02-14 DIAGNOSIS — K219 Gastro-esophageal reflux disease without esophagitis: Secondary | ICD-10-CM | POA: Diagnosis not present

## 2019-02-14 DIAGNOSIS — I10 Essential (primary) hypertension: Secondary | ICD-10-CM

## 2019-02-14 DIAGNOSIS — N281 Cyst of kidney, acquired: Secondary | ICD-10-CM

## 2019-02-14 LAB — CBC WITH DIFFERENTIAL/PLATELET
Abs Immature Granulocytes: 0.01 10*3/uL (ref 0.00–0.07)
Basophils Absolute: 0 10*3/uL (ref 0.0–0.1)
Basophils Relative: 0 %
Eosinophils Absolute: 0.1 10*3/uL (ref 0.0–0.5)
Eosinophils Relative: 1 %
HCT: 43.4 % (ref 39.0–52.0)
Hemoglobin: 14.8 g/dL (ref 13.0–17.0)
Immature Granulocytes: 0 %
Lymphocytes Relative: 31 %
Lymphs Abs: 1.4 10*3/uL (ref 0.7–4.0)
MCH: 31 pg (ref 26.0–34.0)
MCHC: 34.1 g/dL (ref 30.0–36.0)
MCV: 91 fL (ref 80.0–100.0)
Monocytes Absolute: 0.4 10*3/uL (ref 0.1–1.0)
Monocytes Relative: 9 %
Neutro Abs: 2.6 10*3/uL (ref 1.7–7.7)
Neutrophils Relative %: 59 %
Platelets: 142 10*3/uL — ABNORMAL LOW (ref 150–400)
RBC: 4.77 MIL/uL (ref 4.22–5.81)
RDW: 12.5 % (ref 11.5–15.5)
WBC: 4.5 10*3/uL (ref 4.0–10.5)
nRBC: 0 % (ref 0.0–0.2)

## 2019-02-14 LAB — COMPREHENSIVE METABOLIC PANEL
ALT: 11 U/L (ref 0–44)
AST: 16 U/L (ref 15–41)
Albumin: 4.3 g/dL (ref 3.5–5.0)
Alkaline Phosphatase: 55 U/L (ref 38–126)
Anion gap: 7 (ref 5–15)
BUN: 16 mg/dL (ref 6–20)
CO2: 25 mmol/L (ref 22–32)
Calcium: 9 mg/dL (ref 8.9–10.3)
Chloride: 104 mmol/L (ref 98–111)
Creatinine, Ser: 1.2 mg/dL (ref 0.61–1.24)
GFR calc Af Amer: >60 mL/min (ref >60)
GFR calc non Af Amer: >60 mL/min (ref >60)
Glucose, Bld: 98 mg/dL (ref 70–99)
Potassium: 3.8 mmol/L (ref 3.5–5.1)
Sodium: 136 mmol/L (ref 135–145)
Total Bilirubin: 1.4 mg/dL — ABNORMAL HIGH (ref 0.3–1.2)
Total Protein: 6.8 g/dL (ref 6.5–8.1)

## 2019-02-14 LAB — TECHNOLOGIST SMEAR REVIEW
Plt Morphology: ADEQUATE
RBC Morphology: NORMAL

## 2019-02-14 LAB — FOLATE: Folate: 9.9 ng/mL (ref >5.9)

## 2019-02-14 LAB — VITAMIN B12: Vitamin B-12: 606 pg/mL (ref 180–914)

## 2019-02-14 NOTE — Progress Notes (Addendum)
Patient ID: Clifford Alexander, male   DOB: Mar 08, 1966, 53 y.o.   MRN: [id number]   Subjective:    Patient ID: Clifford Alexander, male    DOB: 23-Apr-1965, 53 y.o.   MRN: [id number]  HPI  This visit occurred during the SARS-CoV-2 public health emergency.  Safety protocols were in place, including screening questions prior to the visit, additional usage of staff PPE, and extensive cleaning of exam room while observing appropriate contact time as indicated for disinfecting solutions.  Patient here for a scheduled follow up.  He reports he is doing well.  Working.  Drives a truck.  No chest pain.  No sob.  No acid reflux.  No abdominal pain.  Bowels moving.  Does report some left shoulder pain.  Was working with arms overhead and bent back a couple of months ago.  Has been having some soreness since.  Some discomfort with abduction above 90 degrees.  Does not limit his activity.  Desires no further intervention right now.  Wants to monitor.  No headache or dizziness.  Using cpap.     Past Medical History:  Diagnosis Date  . Bradycardia   . CPAP (continuous positive airway pressure) dependence   . Fatigue 06/23/2008  . Hyperlipidemia   . Hypertension   . Insomnia 04/19/2007  . Neuromuscular disorder (HCC)    neuropathy in feet  . Shingles 01/2016  . Sleep apnea   . Thrombocytopenia ([place])    Past Surgical History:  Procedure Laterality Date  . LUMBAR LAMINECTOMY  04/22/2007   L4-L5   discectomy 03/2008   Family History  Problem Relation Age of Onset  . Hyperthyroidism Mother   . Kidney disease Mother   . Hypertension Father   . Prostate cancer Paternal Grandfather    Social History   Socioeconomic History  . Marital status: Married    Spouse name: Not on file  . Number of children: Not on file  . Years of education: Not on file  . Highest education level: Not on file  Occupational History  . Not on file  Social Needs  . Financial resource strain: Not on file  . Food insecurity   Worry: Not on file    Inability: Not on file  . Transportation needs    Medical: Not on file    Non-medical: Not on file  Tobacco Use  . Smoking status: Former Smoker    Packs/day: 1.00    Years: 10.00    Pack years: 10.00    Quit date: 04/18/1992    Years since quitting: 26.8  . Smokeless tobacco: Never Used  . Tobacco comment: quit in 1994  Substance and Sexual Activity  . Alcohol use: No    Alcohol/week: 0.0 standard drinks  . Drug use: No  . Sexual activity: Yes  Lifestyle  . Physical activity    Days per week: Not on file    Minutes per session: Not on file  . Stress: Not on file  Relationships  . Social Herbalist on phone: Not on file    Gets together: Not on file    Attends religious service: Not on file    Active member of club or organization: Not on file    Attends meetings of clubs or organizations: Not on file    Relationship status: Not on file  Other Topics Concern  . Not on file  Social History Narrative  . Not on file    Outpatient Encounter Medications as  of 02/14/2019  Medication Sig  . Alpha-Lipoic Acid 200 MG CAPS Take 600 mg by mouth daily.  Marland Kitchen amLODipine (NORVASC) 5 MG tablet Take 1 tablet by mouth once daily  . azelastine (ASTELIN) 0.1 % nasal spray Place into the nose.  . b complex vitamins tablet Take 1 tablet by mouth daily.  . Cholecalciferol (VITAMIN D3) 125 MCG (5000 UT) CAPS Take 1 capsule by mouth daily.  Marland Kitchen co-enzyme Q-10 30 MG capsule Take 100 mg by mouth daily.   . hyoscyamine (LEVSIN SL) 0.125 MG SL tablet Place under the tongue.  Marland Kitchen MAGNESIUM OXIDE PO Take 100 mg by mouth daily.   Marland Kitchen omega-3 acid ethyl esters (LOVAZA) 1 G capsule Take by mouth 2 (two) times daily.  . pantoprazole (PROTONIX) 40 MG tablet Take by mouth.  Marland Kitchen PREBIOTIC PRODUCT PO Take 1 Dose by mouth daily.   Marland Kitchen saccharomyces boulardii (FLORASTOR) 250 MG capsule Take by mouth.  . tamsulosin (FLOMAX) 0.4 MG CAPS capsule Take 1 capsule (0.4 mg total) by mouth  daily.  . Turmeric 500 MG CAPS Take 500 mg by mouth daily.   . vitamin B-12 (CYANOCOBALAMIN) 100 MCG tablet Take 100 mcg by mouth daily.   No facility-administered encounter medications on file as of 02/14/2019.    Review of Systems  Constitutional: Negative for appetite change and unexpected weight change.  HENT: Negative for congestion and sinus pressure.   Respiratory: Negative for cough, chest tightness and shortness of breath.   Cardiovascular: Negative for chest pain, palpitations and leg swelling.  Gastrointestinal: Negative for abdominal pain, diarrhea, nausea and vomiting.  Genitourinary: Negative for difficulty urinating and dysuria.  Musculoskeletal: Negative for joint swelling and myalgias.       Left shoulder pain as outlined.    Skin: Negative for color change and rash.  Neurological: Negative for dizziness, light-headedness and headaches.  Psychiatric/Behavioral: Negative for agitation and dysphoric mood.       Objective:    Physical Exam Constitutional:      General: He is not in acute distress.    Appearance: Normal appearance. He is well-developed.  HENT:     Head: Normocephalic and atraumatic.     Right Ear: External ear normal.     Left Ear: External ear normal.  Eyes:     General: No scleral icterus.       Right eye: No discharge.        Left eye: No discharge.     Conjunctiva/sclera: Conjunctivae normal.  Neck:     Musculoskeletal: Neck supple. No muscular tenderness.  Cardiovascular:     Rate and Rhythm: Normal rate and regular rhythm.  Pulmonary:     Effort: Pulmonary effort is normal.     Breath sounds: Normal breath sounds.  Abdominal:     General: Bowel sounds are normal.     Palpations: Abdomen is soft.     Tenderness: There is no abdominal tenderness.  Musculoskeletal:        General: No swelling or tenderness.  Lymphadenopathy:     Cervical: No cervical adenopathy.  Skin:    Findings: No erythema or rash.  Neurological:     Mental  Status: He is alert.  Psychiatric:        Mood and Affect: Mood normal.        Behavior: Behavior normal.     BP 128/80   Pulse 68   Temp (!) 97.4 F (36.3 C)   Resp 16   Wt 258 lb 3.2  oz (117.1 kg)   SpO2 98%   BMI 31.43 kg/m  Wt Readings from Last 3 Encounters:  02/14/19 258 lb (117 kg)  02/14/19 258 lb 3.2 oz (117.1 kg)  02/07/19 250 lb (113.4 kg)     Lab Results  Component Value Date   WBC 4.5 02/14/2019   HGB 14.8 02/14/2019   HCT 43.4 02/14/2019   PLT 142 (L) 02/14/2019   GLUCOSE 98 02/14/2019   CHOL 182 10/25/2018   TRIG 83.0 10/25/2018   HDL 41.20 10/25/2018   LDLCALC 124 (H) 10/25/2018   ALT 11 02/14/2019   AST 16 02/14/2019   NA 136 02/14/2019   K 3.8 02/14/2019   CL 104 02/14/2019   CREATININE 1.20 02/14/2019   BUN 16 02/14/2019   CO2 25 02/14/2019   TSH 1.57 10/26/2017   PSA 0.55 10/25/2018   HGBA1C 5.5 10/25/2018    Ultrasound Renal Complete  Result Date: 01/31/2019 CLINICAL DATA:  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 EXAM: RENAL / URINARY TRACT ULTRASOUND COMPLETE COMPARISON:  CTs, 09/27/2018 and 07/27/2017. FINDINGS: Right Kidney: Renal measurements: 13.9 x 5.2 x 5.5 cm = volume: 209 mL. Normal parenchymal echogenicity. Exophytic midpole simple appearing 2 cm cyst stable from the prior CTs. No other renal masses, no stones and no hydronephrosis. Left Kidney: Renal measurements: 13.9 x 5.3 x 5.8 cm = volume: 227 mL. Normal parenchymal echogenicity. No mass, stone or hydronephrosis. 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 It is also not evident on the CT from 09/27/2018. Bladder: Appears normal for degree of bladder distention. Other: None. IMPRESSION: 1. No acute findings.  No hydronephrosis. 2. Stable simple appearing right midpole exophytic renal cyst. No other right renal abnormality. 3. No sonographic evidence of a left renal angiomyolipoma. Normal appearance of the left kidney.  Electronically Signed   By: Lajean Manes M.D.   On: 01/31/2019 14:37       Assessment & Plan:   Problem List Items Addressed This Visit    Benign essential HTN - Primary    Blood pressure under good control.  Continue same medication regimen.  Follow pressures.  Follow metabolic panel.        Relevant Orders   Basic metabolic panel (future)   GERD (gastroesophageal reflux disease)    Controlled.  Follow.       Hyperglycemia    Low carb diet and exercise.  Follow met b and a1c.       Hyperlipidemia    Low cholesterol diet and exercise.  Follow lipid panel.       Renal cyst    Recent f/u with urology.  CT and recent ultrasound - no visualization of angiomyolipoma.  Recommended f/u renal ultrasound in one year.        Sleep apnea    Continue CPAP.       Thrombocytopenia ([place])    Followed by hematology.  Has f/u today.           Einar Pheasant, MD

## 2019-02-14 NOTE — Progress Notes (Signed)
Patient stated that he had been doing well with no complaints. 

## 2019-02-15 ENCOUNTER — Encounter: Payer: Self-pay | Admitting: Oncology

## 2019-02-15 LAB — HIV ANTIBODY (ROUTINE TESTING W REFLEX): HIV Screen 4th Generation wRfx: NONREACTIVE — AB

## 2019-02-15 LAB — HEPATITIS C ANTIBODY: HCV Ab: 0.1 s/co ratio — AB (ref 0.0–0.9)

## 2019-02-15 NOTE — Progress Notes (Signed)
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  Of note patient has had mild thrombocytopenia dating back to April 2019 and his platelet counts fluctuate between 130s to 140s.  His hemoglobin and white count have always been normal.  Patient denies any history of bleeding or bruising.  He is currently on B12 supplements.  He is a Administrator by profession.  His appetite and weight have been stable.  Denies any joint pain or skin rash.  Interval history-patient is doing well overall today.  He denies any bleeding bruising.  ECOG PS- 0 Pain scale- 0   Review of systems- Review of Systems  Constitutional: Negative for chills, fever, malaise/fatigue and weight loss.  HENT: Negative for congestion, ear discharge and nosebleeds.   Eyes: Negative for blurred vision.  Respiratory: Negative for cough, hemoptysis, sputum production, shortness of breath and wheezing.   Cardiovascular: Negative for chest pain, palpitations, orthopnea and claudication.  Gastrointestinal: Negative for abdominal pain, blood in stool, constipation, diarrhea, heartburn, melena, nausea and vomiting.  Genitourinary: Negative for dysuria, flank pain, frequency, hematuria and urgency.  Musculoskeletal: Negative for back pain, joint pain and myalgias.  Skin: Negative for rash.  Neurological:  Negative for dizziness, tingling, focal weakness, seizures, weakness and headaches.  Endo/Heme/Allergies: Does not bruise/bleed easily.  Psychiatric/Behavioral: Negative for depression and suicidal ideas. The patient does not have insomnia.      Allergies  Allergen Reactions   Naproxen Shortness Of Breath   Ciprofloxacin    Ibuprofen      Past Medical History:  Diagnosis Date   Bradycardia    CPAP (continuous positive airway pressure) dependence    Fatigue 06/23/2008   Hyperlipidemia    Hypertension    Insomnia 04/19/2007   Neuromuscular disorder (HCC)    neuropathy in feet   Shingles 01/2016   Sleep apnea    Thrombocytopenia (HCC)      Past Surgical History:  Procedure Laterality Date   LUMBAR LAMINECTOMY  04/22/2007   L4-L5   discectomy 03/2008    Social History   Socioeconomic History   Marital status: Married    Spouse name: Not on file   Number of children: Not on file   Years of education: Not on file   Highest education level: Not on file  Occupational History   Not on file  Social Needs   Financial resource strain: Not on file   Food insecurity    Worry: Not on file    Inability: Not on file   Transportation needs    Medical: Not on file    Non-medical: Not on file  Tobacco Use   Smoking status: Former Smoker    Packs/day: 1.00    Years: 10.00    Pack years: 10.00    Quit date: 04/18/1992    Years since quitting: 26.8   Smokeless tobacco: Never Used   Tobacco comment: quit in 1994  Substance and Sexual Activity  Alcohol use: No    Alcohol/week: 0.0 standard drinks   Drug use: No   Sexual activity: Yes  Lifestyle   Physical activity    Days per week: Not on file    Minutes per session: Not on file   Stress: Not on file  Relationships   Social connections    Talks on phone: Not on file    Gets together: Not on file    Attends religious service: Not on file    Active member of club or organization: Not on  file    Attends meetings of clubs or organizations: Not on file    Relationship status: Not on file   Intimate partner violence    Fear of current or ex partner: Not on file    Emotionally abused: Not on file    Physically abused: Not on file    Forced sexual activity: Not on file  Other Topics Concern   Not on file  Social History Narrative   Not on file    Family History  Problem Relation Age of Onset   Hyperthyroidism Mother    Kidney disease Mother    Hypertension Father    Prostate cancer Paternal Grandfather      Current Outpatient Medications:    Alpha-Lipoic Acid 200 MG CAPS, Take 600 mg by mouth daily., Disp: , Rfl:    amLODipine (NORVASC) 5 MG tablet, Take 1 tablet by mouth once daily, Disp: 90 tablet, Rfl: 0   azelastine (ASTELIN) 0.1 % nasal spray, Place into the nose., Disp: , Rfl:    b complex vitamins tablet, Take 1 tablet by mouth daily., Disp: , Rfl:    Cholecalciferol (VITAMIN D3) 125 MCG (5000 UT) CAPS, Take 1 capsule by mouth daily., Disp: , Rfl:    co-enzyme Q-10 30 MG capsule, Take 100 mg by mouth daily. , Disp: , Rfl:    MAGNESIUM OXIDE PO, Take 100 mg by mouth daily. , Disp: , Rfl:    omega-3 acid ethyl esters (LOVAZA) 1 G capsule, Take by mouth 2 (two) times daily., Disp: , Rfl:    PREBIOTIC PRODUCT PO, Take 1 Dose by mouth daily. , Disp: , Rfl:    tamsulosin (FLOMAX) 0.4 MG CAPS capsule, Take 1 capsule (0.4 mg total) by mouth daily., Disp: 30 capsule, Rfl: 11   Turmeric 500 MG CAPS, Take 500 mg by mouth daily. , Disp: , Rfl:    UNABLE TO FIND, Take 1 tablet by mouth daily. Med Name: Betacysterol, Disp: , Rfl:    vitamin B-12 (CYANOCOBALAMIN) 100 MCG tablet, Take 100 mcg by mouth daily., Disp: , Rfl:    hyoscyamine (LEVSIN SL) 0.125 MG SL tablet, Place under the tongue., Disp: , Rfl:    pantoprazole (PROTONIX) 40 MG tablet, Take by mouth., Disp: , Rfl:    saccharomyces boulardii (FLORASTOR) 250 MG capsule, Take by mouth., Disp: ,  Rfl:   Physical exam:  Vitals:   02/14/19 1309  BP: 130/83  Pulse: 77  Temp: (!) 97.3 F (36.3 C)  TempSrc: Tympanic  Weight: 258 lb (117 kg)  Height: 6\' 4"  (1.93 m)   Physical Exam Constitutional:      General: He is not in acute distress. HENT:     Head: Normocephalic and atraumatic.  Eyes:     Pupils: Pupils are equal, round, and reactive to light.  Neck:     Musculoskeletal: Normal range of motion.  Cardiovascular:     Rate and Rhythm: Normal rate and regular  rhythm.     Heart sounds: Normal heart sounds.  Pulmonary:     Effort: Pulmonary effort is normal.     Breath sounds: Normal breath sounds.  Abdominal:     General: Bowel sounds are normal.     Palpations: Abdomen is soft.  Skin:    General: Skin is warm and dry.  Neurological:     Mental Status: He is alert and oriented to person, place, and time.      CMP Latest Ref Rng & Units 02/14/2019  Glucose 70 - 99 mg/dL 98  BUN 6 - 20 mg/dL 16  Creatinine 0.61 - 1.24 mg/dL 1.20  Sodium 135 - 145 mmol/L 136  Potassium 3.5 - 5.1 mmol/L 3.8  Chloride 98 - 111 mmol/L 104  CO2 22 - 32 mmol/L 25  Calcium 8.9 - 10.3 mg/dL 9.0  Total Protein 6.5 - 8.1 g/dL 6.8  Total Bilirubin 0.3 - 1.2 mg/dL 1.4(H)  Alkaline Phos 38 - 126 U/L 55  AST 15 - 41 U/L 16  ALT 0 - 44 U/L 11   CBC Latest Ref Rng & Units 02/14/2019  WBC 4.0 - 10.5 K/uL 4.5  Hemoglobin 13.0 - 17.0 g/dL 14.8  Hematocrit 39.0 - 52.0 % 43.4  Platelets 150 - 400 K/uL 142(L)    No images are attached to the encounter.  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 EXAM: RENAL / URINARY TRACT ULTRASOUND COMPLETE COMPARISON:  CTs, 09/27/2018 and 07/27/2017. FINDINGS: Right Kidney: Renal measurements: 13.9 x 5.2 x 5.5 cm = volume: 209 mL. Normal parenchymal echogenicity. Exophytic midpole simple appearing 2 cm cyst stable from the prior CTs. No other renal masses, no stones and no  hydronephrosis. Left Kidney: Renal measurements: 13.9 x 5.3 x 5.8 cm = volume: 227 mL. Normal parenchymal echogenicity. No mass, stone or hydronephrosis. 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 It is also not evident on the CT from 09/27/2018. Bladder: Appears normal for degree of bladder distention. Other: None. IMPRESSION: 1. No acute findings.  No hydronephrosis. 2. Stable simple appearing right midpole exophytic renal cyst. No other right renal abnormality. 3. No sonographic evidence of a left renal angiomyolipoma. Normal appearance of the left kidney. Electronically Signed   By: Lajean Manes M.D.   On: 01/31/2019 14:37     Assessment and plan- Patient is a 53 y.o. 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  Today his platelet count is 143.  He does not have any other cytopenias and his white count and hemoglobin are normal.   Repeat CBC with differential in 6 months in 1 year and I will see him back in 1 years time.  If his platelet counts are stable at that point he does not require any further follow-up he did have further thrombocytopenia work-up including a smear review B12 and folate which were normal.  HIV testing was negative.  CMP shows mildly elevated bilirubin of 1.4 which she has had once in the past and subsequently resolved.  We can monitor this as well at 6 months.   Visit Diagnosis 1. Thrombocytopenia ([place])      Dr. Randa Evens, MD, MPH [facility] [id number] 02/15/2019 1:43 PM

## 2019-02-16 LAB — MULTIPLE MYELOMA PANEL, SERUM
Albumin SerPl Elph-Mcnc: 4.2 g/dL (ref 2.9–4.4)
Albumin/Glob SerPl: 1.7 (ref 0.7–1.7)
Alpha 1: 0.2 g/dL (ref 0.0–0.4)
Alpha2 Glob SerPl Elph-Mcnc: 0.6 g/dL (ref 0.4–1.0)
B-Globulin SerPl Elph-Mcnc: 1 g/dL (ref 0.7–1.3)
Gamma Glob SerPl Elph-Mcnc: 0.9 g/dL (ref 0.4–1.8)
Globulin, Total: 2.6 g/dL (ref 2.2–3.9)
IgA: 121 mg/dL (ref 90–386)
IgG (Immunoglobin G), Serum: 963 mg/dL (ref 603–1613)
IgM (Immunoglobulin M), Srm: 38 mg/dL (ref 20–172)
Total Protein ELP: 6.8 g/dL (ref 6.0–8.5)

## 2019-02-19 ENCOUNTER — Encounter: Payer: Self-pay | Admitting: Internal Medicine

## 2019-02-19 NOTE — Assessment & Plan Note (Signed)
Recent f/u with urology.  CT and recent ultrasound - no visualization of angiomyolipoma.  Recommended f/u renal ultrasound in one year.

## 2019-02-19 NOTE — Assessment & Plan Note (Signed)
Low carb diet and exercise.  Follow met b and a1c.  

## 2019-02-19 NOTE — Assessment & Plan Note (Signed)
Blood pressure under good control.  Continue same medication regimen.  Follow pressures.  Follow metabolic panel.   

## 2019-02-19 NOTE — Assessment & Plan Note (Signed)
Controlled.  Follow.   

## 2019-02-19 NOTE — Assessment & Plan Note (Signed)
Low cholesterol diet and exercise.  Follow lipid panel.   

## 2019-02-19 NOTE — Assessment & Plan Note (Signed)
Followed by hematology.  Has f/u today.

## 2019-02-19 NOTE — Assessment & Plan Note (Signed)
Continue CPAP.  

## 2019-04-07 ENCOUNTER — Telehealth: Payer: Self-pay | Admitting: Internal Medicine

## 2019-04-07 DIAGNOSIS — M25512 Pain in left shoulder: Secondary | ICD-10-CM

## 2019-04-07 NOTE — Telephone Encounter (Signed)
Order placed for physical therapy referral.

## 2019-04-07 NOTE — Telephone Encounter (Signed)
When pt saw Dr. Nicki Reaper in November they talked about therapy on his shoulder. Pt has changed his and would like a referral for his shoulder.

## 2019-04-07 NOTE — Telephone Encounter (Signed)
Pt would like referral to therapy for his shoulder.

## 2019-04-13 ENCOUNTER — Other Ambulatory Visit: Payer: Self-pay | Admitting: Internal Medicine

## 2019-04-13 ENCOUNTER — Telehealth: Payer: Self-pay | Admitting: Internal Medicine

## 2019-04-13 DIAGNOSIS — I10 Essential (primary) hypertension: Secondary | ICD-10-CM

## 2019-04-14 NOTE — Telephone Encounter (Signed)
Pt called about needing a refill for amLODipine (NORVASC) 5 MG tablet. Pt received a msg stating denied. Please advise and Thank you!  Pharmacy is [place], [place] - [place]  Call pt @ [phone].

## 2019-04-14 NOTE — Telephone Encounter (Signed)
Patient is aware this was a duplicate request

## 2019-04-25 ENCOUNTER — Other Ambulatory Visit: Payer: Self-pay

## 2019-04-25 ENCOUNTER — Ambulatory Visit: Payer: Managed Care, Other (non HMO) | Attending: Internal Medicine | Admitting: Physical Therapy

## 2019-04-25 ENCOUNTER — Encounter: Payer: Self-pay | Admitting: Physical Therapy

## 2019-04-25 DIAGNOSIS — M67911 Unspecified disorder of synovium and tendon, right shoulder: Secondary | ICD-10-CM | POA: Diagnosis present

## 2019-04-25 DIAGNOSIS — M7541 Impingement syndrome of right shoulder: Secondary | ICD-10-CM | POA: Insufficient documentation

## 2019-04-25 NOTE — Therapy (Addendum)
[place] [address], [place], [address] Phone: [phone]   Fax:  [phone]  Physical Therapy Evaluation  Patient Details  Name: Clifford Alexander MRN: [id number] Date of Birth: [birth year] No data recorded  Encounter Date: 04/25/2019  PT End of Session - 04/25/19 1045    Visit Number  1    Number of Visits  17    Date for PT Re-Evaluation  06/20/19    PT Start Time  0800    PT Stop Time  0900    PT Time Calculation (min)  60 min    Activity Tolerance  Patient tolerated treatment well    Behavior During Therapy  [facility] for tasks assessed/performed       Past Medical History:  Diagnosis Date  . Bradycardia   . CPAP (continuous positive airway pressure) dependence   . Fatigue 06/23/2008  . Hyperlipidemia   . Hypertension   . Insomnia 04/19/2007  . Neuromuscular disorder (HCC)    neuropathy in feet  . Shingles 01/2016  . Sleep apnea   . Thrombocytopenia ([place])     Past Surgical History:  Procedure Laterality Date  . LUMBAR LAMINECTOMY  04/22/2007   L4-L5   discectomy 03/2008    There were no vitals filed for this visit.   Subjective Assessment - 04/25/19 1037    Pertinent History  Pt is a 54 year old male with C/C of R shoulder pain. Pt reports pain started in October of 2020 while he was working on his truck. He was on his back under the truck and as he reached up, he "felt like it overextended" and felt pain. Pain has been improving since oct and pt states he has seen a "50% improvement" in pain. Currently, pain is 0/10, but at its worst gets to 5-6/10. Pt describes pain as achy and dull that stays in his shoulder but will occasionally radiate down below the elbow. 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 Pt reports pain that wakes him up during the night, but able to fall back asleep with changing  position. Easing factors include changing arm position, applying topical glutathione, and stretches for extension. 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 Pt enjoys riding his motorcycle, which he has been unable to do since his injury. Pt goals include reducing pain and increasing strength so he can sleep/drive without pain, work on his truck, and ride his motorcycle pain-free. Pt does not have a history of cancer, heart attack, seizure, or stroke. Pt denies numbness and tingling, sensation changes, bowel/bladder abnormalities, saddle paresthesia, fever/night sweats, or unexplained weight loss or weight gain.    Limitations  Sitting;House hold activities;Lifting;Other (comment)   Reaching OH   How long can you sit comfortably?  depends    How long can you stand comfortably?  unlimited    How long can you walk comfortably?  unlimited    Patient Stated Goals  decrease pain, reach overhead, ride motorcycle, work on truck    Currently in Pain?  Yes    Pain Score  0-No pain    Pain Location  Shoulder    Pain Orientation  Right    Pain Descriptors / Indicators  Aching;Dull    Pain Type  Chronic pain    Pain Radiating Towards  forearm    Pain Onset  More than a month  ago    Pain Frequency  Intermittent   worsens when arm raises OH   Aggravating Factors   resting ebow on arm rest, reaching overhead    Pain Relieving Factors  stretching, topical glutathione, keeping arm at side    Effect of Pain on Daily Activities  ADLs (dressing, grooming, brushing teeth), driving    Multiple Pain Sites  No       Objective measurements completed on examination: See above findings.     Starting pain level: 0/10, end of session 3/10 Clear: Cervical AROM - clear and WNL  Elbow ROM - WNL not painful  MMT - Flexion 5/5 bilat   Extension 5/5 bilat (painful on R)   Posture/Observation: Pt has forward head posture with thoracic kyphosis and rounded shoulders; pt is able to correct posture   Type II scapular dyskinesis on the R side with medial border winging  Palpation: Trigger points in upper trap Some tenderness at biciptal groove, but not pain that brought him in; more painful at acromion  Pain at supraspinatus muscle belly  Seated: AROM RIGHT LEFT  Flexion 150 WNL  ABD 130 Painful arc WNL  IR  At 90 ABD S2 painful WNL  ER At 90 ABD C7 not painfl WNL   AROM/PROM   Extension WNL Painful test   PROM RIGHT LEFT  Shoulder Flexion 140 180  GH ABD 150 180           MMT RIGHT LEFT  Shoulder Flexion 4+/5 painful 5/5  Shoulder ABD 4+/5 painful 5/5  IR 5/5 painful 5/5  ER 5/5 5/5   MMT L   R  Lower Traps 3/5  3/5 painful  Rhomboid 4/5   4-/5  Lats 5/5     PAM RIGHT LEFT  Inferior Painful at 150   Lateral Felt ok at 90    Scapulothoracic Rhythm RIGHT LEFT  Protraction Retraction WNL   Elevation Depression Little painful with depression; some clicking   Upward Rotation Downward Rotation WNL    Special Tests Drop Arm (-), some pain but able to control Lateral Jobe (+) Belly press test (+) with MMT painful Hawkins-Kennedy test (+) Neer's sign (+) Painful arc (+); scapular assist improved pain Empty Can (+) ER resistance (-)  Speed's test (+) not at biceps insertion, reports diffuse shoulder pain Yergusesn (-) Active Compression (+) for decreased pain with motion with compression Biceps Load II (+) not at biceps insertion, reports diffuse shoulder pain Crank Test (+) Sulcus (-) Anterior Apprehension (+)    THEREX/HEP -Postural education correction -Scapular squeezes/retractions x10 every hour on the hour during his drive -Door frame pec stretch at 90 ABD 3x30 seconds  -Standing Towel IR stretch 15x5 sec holds -Book openings 10 ea direction with 3 sec hold                        PT Education - 04/25/19 1044    Education Details  PT educated patient on current condition- impingement syndrome and partial RTC tear,  correcting posture for sitting, new home exercises and scapular orientation    Person(s) Educated  Patient    Methods  Explanation;Tactile cues    Comprehension  Verbalized understanding;Returned demonstration       PT Short Term Goals - 04/25/19 1055      PT SHORT TERM GOAL #1   Title  In 4 weeks, pt will decrease pain score by 2 points in order to show clinically significant improvement in  pain.    Baseline  04/25/2019; Worst: 5-6/10    Time  4    Period  Weeks    Status  New    Target Date  05/23/19      PT SHORT TERM GOAL #2   Title  In 4 weeks, pt will be able to sleep through the night without shoulder pain.    Baseline  04/25/2019; pain wakes him up in the night    Time  4    Period  Weeks    Status  New    Target Date  05/23/19        PT Long Term Goals - 04/25/19 1057      PT LONG TERM GOAL #1   Title  In 8 weeks, pt will improve ROM in R shoulder WNL without pain in order to improve functional mobility for ADLs and work.    Baseline  04/25/2019; AROM Flexion 150d and painful; ABD 130d painful; IR L1    Time  8    Period  Weeks    Status  New    Target Date  06/20/19      PT LONG TERM GOAL #2   Title  In 8 weeks, pt will improve gross strength of periscapular musculature to 5/5 in order to maintain proper shoulder girdle posture and improve motor control.    Baseline  04/25/2019; Is (3/5; painful), T's (4-/5; painful), Y's (4+/5; painful)    Time  8    Period  Weeks    Status  New    Target Date  06/20/19      PT LONG TERM GOAL #3   Title  In 8 weeks, pt will decrease FOTO score by 9 points in order to show clinically significant improvement functionally.    Baseline  04/25/2019    Time  8    Period  Weeks    Status  New    Target Date  06/20/19             Plan - 04/25/19 1049    Clinical Impression Statement  Pt is a 54 year old male presenting with R shoulder pain. Pt symptoms are consistent with impingement syndrome and a partial  supraspinatus tear; possible labrum pathology.  Pt impairments include pain, painful arc, upper crossed syndrome, decreased ROM (flexion, ABD, IR), decreased periscapular strength, and decreased motor control. Activity limitations include inability to reach overhead without pain, scapular dyskinesis, postural abnormalities, and decreased muscular endurance for OH activity. Participation is restrictions include decreased sleep hygiene, decreased ability to drive comfortably for work, inability to ride motorcycle, and painful ADLs (reaching overhead for showering, brushing teeth, grooming). Pt can self-correct posture and expressed willingness to perform exercises of HEP. Pt is a good candidate for physical therapy and has good rehabilitation potential. PT will progress patient for aforementioned impairments, with knowledge that patient may have labrum pathology that requires imaging. Patient does endorse some subjective signs and symptoms of labrum pathology (hyper flexion injury, popping/clicking, pain with overhead motions and laying on affected shoulder), and some physical special tests are positive for this (Speeds, Active compression, biceps load II, crank, anterior apprehension) though some tests are not painful to labrum consistent area (reports posterior shoulder pain with bicep load II). PT will treat patient with this in mind, and refer for imaging as pain is consistent or progress plateaus.    Personal Factors and Comorbidities  Fitness;Profession;Time since onset of injury/illness/exacerbation;Comorbidity 3+    Comorbidities  Hyperlipidemia, hypertension, insomnia, thrombocytopenia  Examination-Activity Limitations  Reach Overhead;Hygiene/Grooming;Lift;Carry;Dressing    Examination-Participation Restrictions  [place];Other    Stability/Clinical Decision Making  Evolving/Moderate complexity    Clinical Decision Making  Moderate    Rehab Potential  Good    PT Frequency  1x / week    PT  Duration  8 weeks    PT Treatment/Interventions  ADLs/Self Care Home Management;Cryotherapy;Electrical Stimulation;Traction;Ultrasound;Functional mobility training;Therapeutic activities;Therapeutic exercise;Neuromuscular re-education;Manual techniques;Passive range of motion;Joint Manipulations;Spinal Manipulations;Dry needling;Moist Heat;Patient/family education;Iontophoresis 4mg /ml Dexamethasone;DME Instruction    PT Next Visit Plan  perascapular strengthening, pain control    PT Home Exercise Plan  scapular retractions, pec door frame stretch, book openings, postural correction, standing towel IR/ER stretch    Consulted and Agree with Plan of Care  Patient       Patient will benefit from skilled therapeutic intervention in order to improve the following deficits and impairments:  Decreased endurance, Decreased range of motion, Decreased strength, Pain, Decreased mobility, Postural dysfunction  Visit Diagnosis: Shoulder impingement syndrome, right  Rotator cuff dysfunction, right  Rotator cuff impingement syndrome of right shoulder     Problem List Patient Active Problem List   Diagnosis Date Noted  . Thrombocytopenia ([place]) 10/09/2018  . Renal cyst 10/09/2018  . Sleep apnea 09/26/2017  . Headache 09/26/2017  . Polyneuropathy 07/18/2017  . Abdominal pain 07/13/2017  . CTS (carpal tunnel syndrome) 02/05/2017  . Numbness in feet 10/27/2016  . Vitamin D deficiency 10/27/2016  . Health care maintenance 11/19/2015  . Acute bilateral low back pain without sciatica 07/18/2015  . Sinusitis, acute maxillary 07/18/2015  . History of hay fever 10/23/2014  . GERD (gastroesophageal reflux disease) 10/23/2014  . Cephalalgia 10/23/2014  . Dysmetabolic syndrome 123456  . Hyperglycemia 10/23/2014  . Hyperlipidemia 10/23/2014  . External thrombosed hemorrhoids 04/06/2008  . 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 Cannot sleep 04/19/2007  . Benign essential HTN  08/05/2006   Shelton Silvas PT, DPT Ivin Booty, SPT Shelton Silvas 04/25/2019, 1:13 PM  [place] [facility] [address], [place], [address] Phone: [phone]   Fax:  [phone]  Name: Clifford Alexander MRN: [id number] Date of Birth: [birth year]

## 2019-05-02 ENCOUNTER — Encounter: Payer: Self-pay | Admitting: Physical Therapy

## 2019-05-02 ENCOUNTER — Other Ambulatory Visit: Payer: Self-pay

## 2019-05-02 ENCOUNTER — Ambulatory Visit: Payer: Managed Care, Other (non HMO) | Attending: Internal Medicine | Admitting: Physical Therapy

## 2019-05-02 DIAGNOSIS — M7541 Impingement syndrome of right shoulder: Secondary | ICD-10-CM | POA: Insufficient documentation

## 2019-05-02 DIAGNOSIS — M67911 Unspecified disorder of synovium and tendon, right shoulder: Secondary | ICD-10-CM | POA: Diagnosis present

## 2019-05-02 NOTE — Therapy (Addendum)
[place] [facility] [address] [place], [address] Phone: [phone]   Fax:  [phone]  Physical Therapy Treatment  Patient Details  Name: Clifford Alexander MRN: [id number] Date of Birth: 1965-10-11 No data recorded  Encounter Date: 05/02/2019  PT End of Session - 05/02/19 0756    Visit Number  2    Number of Visits  17    Date for PT Re-Evaluation  06/20/19    PT Start Time  0730    PT Stop Time  0812    PT Time Calculation (min)  42 min    Activity Tolerance  Patient tolerated treatment well    Behavior During Therapy  [facility] for tasks assessed/performed       Past Medical History:  Diagnosis Date  . Bradycardia   . CPAP (continuous positive airway pressure) dependence   . Fatigue 06/23/2008  . Hyperlipidemia   . Hypertension   . Insomnia 04/19/2007  . Neuromuscular disorder (HCC)    neuropathy in feet  . Shingles 01/2016  . Sleep apnea   . Thrombocytopenia ([place])     Past Surgical History:  Procedure Laterality Date  . LUMBAR LAMINECTOMY  04/22/2007   L4-L5   discectomy 03/2008    There were no vitals filed for this visit.  Subjective Assessment - 05/02/19 0733    Subjective  Pt reports a little soreness, not painful just a little of an ache. Pt reports HEP is going well and seems to be help with mobility and strength.    Pertinent History  Pt is a 54 year old male with C/C of R shoulder pain. Pt reports pain started in October of 2020 while he was working on his truck. He was on his back under the truck and as he reached up, he "felt like it overextended" and felt pain. Pain has been improving since oct and pt states he has seen a "50% improvement" in pain. Currently, pain is 0/10, but at its worst gets to 5-6/10. Pt describes pain as achy and dull that stays in his shoulder but will occasionally radiate down below the elbow. 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 Pt reports pain that wakes him up during the night, but able to fall back asleep with changing position. Easing factors include changing arm position, applying topical glutathione, and stretches for extension. 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 Pt enjoys riding his motorcycle, which he has been unable to do since his injury. Pt goals include reducing pain and increasing strength so he can sleep/drive without pain, work on his truck, and ride his motorcycle pain-free. Pt does not have a history of cancer, heart attack, seizure, or stroke. Pt denies numbness and tingling, sensation changes, bowel/bladder abnormalities, saddle paresthesia, fever/night sweats, or unexplained weight loss or weight gain.    Limitations  Sitting;House hold activities;Lifting;Other (comment)    How long can you sit comfortably?  depends    How long can you stand comfortably?  unlimited    How long can you walk comfortably?  unlimited         THEREX Sidelying ER 2# 1x10, 5# 2x8 with cueing to keep elbow close to the trunk Prone HABD 1x8, 5# x5 too hard for pt then 2# x4, 2# x8 with cueing for scapular retraction with good carryover Prone Y's 2x8 good muscle activation  for lower trap Scapular punches 3x10 with good scapular control and able to attain position with minimal cueing  Book openings in supine x6 on L and x8 on R (R lacking mobility)   MANTHER -Grade I/II mobs in Flexion and ABD inferior and posterior glides 3x30 sec  -Pt notes decreased stiffness and no painful arc after manual therapy                       PT Education - 05/02/19 0755    Education Details  Therex form, scapular strengthening    Person(s) Educated  Patient    Methods  Explanation    Comprehension  Verbalized understanding;Returned demonstration       PT Short Term Goals - 04/25/19 1055      PT SHORT TERM GOAL #1   Title  In 4  weeks, pt will decrease pain score by 2 points in order to show clinically significant improvement in pain.    Baseline  04/25/2019; Worst: 5-6/10    Time  4    Period  Weeks    Status  New    Target Date  05/23/19      PT SHORT TERM GOAL #2   Title  In 4 weeks, pt will be able to sleep through the night without shoulder pain.    Baseline  04/25/2019; pain wakes him up in the night    Time  4    Period  Weeks    Status  New    Target Date  05/23/19        PT Long Term Goals - 04/25/19 1057      PT LONG TERM GOAL #1   Title  In 8 weeks, pt will improve ROM in R shoulder WNL without pain in order to improve functional mobility for ADLs and work.    Baseline  04/25/2019; AROM Flexion 150d and painful; ABD 130d painful; IR L1    Time  8    Period  Weeks    Status  New    Target Date  06/20/19      PT LONG TERM GOAL #2   Title  In 8 weeks, pt will improve gross strength of periscapular musculature to 5/5 in order to maintain proper shoulder girdle posture and improve motor control.    Baseline  04/25/2019; Is (3/5; painful), T's (4-/5; painful), Y's (4+/5; painful)    Time  8    Period  Weeks    Status  New    Target Date  06/20/19      PT LONG TERM GOAL #3   Title  In 8 weeks, pt will decrease FOTO score to 73 in order to demonstrate predicted functional level    Baseline  04/25/2019 60    Time  8    Period  Weeks    Status  New    Target Date  06/20/19             Plan - 05/02/19 0917    Clinical Impression Statement  PT started gentle strengthening exercises focusing on parascapular muscles and rotator cuff for stability. PT educated pt about how exercises and strength can help reduce the pain. Pt stated after mobs, stiffness was reduced and he felt better. Pt has good muscle activation in parascapular muscles, but lacks strength especially in OH position. PT added some dynamic stretches at end of session to maintain mobility. Pt responded well to therapy and stated  at end of  session he felt a little better. PT will continue with strength and mobility as appropriate.    Personal Factors and Comorbidities  Fitness;Profession;Time since onset of injury/illness/exacerbation;Comorbidity 3+    Comorbidities  Hyperlipidemia, hypertension, insomnia, thrombocytopenia    Examination-Activity Limitations  Reach Overhead;Hygiene/Grooming;Lift;Carry;Dressing    Examination-Participation Restrictions  Yard Work;Cleaning;Other    Stability/Clinical Decision Making  Evolving/Moderate complexity    Clinical Decision Making  Moderate    Rehab Potential  Good    PT Frequency  1x / week    PT Duration  8 weeks    PT Treatment/Interventions  ADLs/Self Care Home Management;Cryotherapy;Electrical Stimulation;Traction;Ultrasound;Functional mobility training;Therapeutic activities;Therapeutic exercise;Neuromuscular re-education;Manual techniques;Passive range of motion;Joint Manipulations;Spinal Manipulations;Dry needling;Moist Heat;Patient/family education;Iontophoresis 4mg /ml Dexamethasone;DME Instruction    PT Next Visit Plan  perascapular strengthening, pain control    PT Home Exercise Plan  scapular retractions, pec door frame stretch, book openings, postural correction, standing towel IR/ER stretch    Consulted and Agree with Plan of Care  Patient       Patient will benefit from skilled therapeutic intervention in order to improve the following deficits and impairments:  Decreased endurance, Decreased range of motion, Decreased strength, Pain, Decreased mobility, Postural dysfunction  Visit Diagnosis: Shoulder impingement syndrome, right  Rotator cuff dysfunction, right  Rotator cuff impingement syndrome of right shoulder     Problem List Patient Active Problem List   Diagnosis Date Noted  . Thrombocytopenia ([place]) 10/09/2018  . Renal cyst 10/09/2018  . Sleep apnea 09/26/2017  . Headache 09/26/2017  . Polyneuropathy 07/18/2017  . Abdominal pain 07/13/2017  .  CTS (carpal tunnel syndrome) 02/05/2017  . Numbness in feet 10/27/2016  . Vitamin D deficiency 10/27/2016  . Health care maintenance 11/19/2015  . Acute bilateral low back pain without sciatica 07/18/2015  . Sinusitis, acute maxillary 07/18/2015  . History of hay fever 10/23/2014  . GERD (gastroesophageal reflux disease) 10/23/2014  . Cephalalgia 10/23/2014  . Dysmetabolic syndrome 123456  . Hyperglycemia 10/23/2014  . Hyperlipidemia 10/23/2014  . External thrombosed hemorrhoids 04/06/2008  . 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 Cannot sleep 04/19/2007  . Benign essential HTN 08/05/2006   Shelton Silvas PT, DPT Ivin Booty, SPT Shelton Silvas 05/02/2019, 10:18 AM  [place] [facility] [address], [place], [address] Phone: [phone]   Fax:  [phone]  Name: Clifford Alexander MRN: [id number] Date of Birth: 05-Mar-1966

## 2019-05-09 ENCOUNTER — Other Ambulatory Visit: Payer: Self-pay

## 2019-05-09 ENCOUNTER — Encounter: Payer: Self-pay | Admitting: Physical Therapy

## 2019-05-09 ENCOUNTER — Ambulatory Visit: Payer: Managed Care, Other (non HMO) | Admitting: Physical Therapy

## 2019-05-09 DIAGNOSIS — M7541 Impingement syndrome of right shoulder: Secondary | ICD-10-CM

## 2019-05-09 DIAGNOSIS — M67911 Unspecified disorder of synovium and tendon, right shoulder: Secondary | ICD-10-CM

## 2019-05-09 NOTE — Therapy (Signed)
[facility] [address], [place], [address] Phone: [phone]   Fax:  [phone]  Physical Therapy Treatment  Patient Details  Name: Clifford Alexander MRN: [id number] Date of Birth: [birth year] No data recorded  Encounter Date: 05/09/2019  PT End of Session - 05/09/19 0749    Visit Number  3    Number of Visits  17    Date for PT Re-Evaluation  06/20/19    PT Start Time  0732    PT Stop Time  0810    PT Time Calculation (min)  38 min    Activity Tolerance  Patient tolerated treatment well    Behavior During Therapy  [facility] for tasks assessed/performed       Past Medical History:  Diagnosis Date  . Bradycardia   . CPAP (continuous positive airway pressure) dependence   . Fatigue 06/23/2008  . Hyperlipidemia   . Hypertension   . Insomnia 04/19/2007  . Neuromuscular disorder (HCC)    neuropathy in feet  . Shingles 01/2016  . Sleep apnea   . Thrombocytopenia ([place])     Past Surgical History:  Procedure Laterality Date  . LUMBAR LAMINECTOMY  04/22/2007   L4-L5   discectomy 03/2008    There were no vitals filed for this visit.  Subjective Assessment - 05/09/19 0736    Subjective  Pt reports he was having some increased pain over last week, but the weekend it was better. Patient reports he has been having less pain with brushing his teeth, which he is happy with.    Pertinent History  Pt is a 54 year old male with C/C of R shoulder pain. Pt reports pain started in October of 2020 while he was working on his truck. He was on his back under the truck and as he reached up, he "felt like it overextended" and felt pain. Pain has been improving since oct and pt states he has seen a "50% improvement" in pain. Currently, pain is 0/10, but at its worst gets to 5-6/10. Pt describes pain as achy and dull that stays in his shoulder but will occasionally radiate down below the elbow. 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 Pt reports pain that wakes him up during the night, but able to fall back asleep with changing position. Easing factors include changing arm position, applying topical glutathione, and stretches for extension. 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 Pt enjoys riding his motorcycle, which he has been unable to do since his injury. Pt goals include reducing pain and increasing strength so he can sleep/drive without pain, work on his truck, and ride his motorcycle pain-free. Pt does not have a history of cancer, heart attack, seizure, or stroke. Pt denies numbness and tingling, sensation changes, bowel/bladder abnormalities, saddle paresthesia, fever/night sweats, or unexplained weight loss or weight gain.    Limitations  Sitting;House hold activities;Lifting;Other (comment)    How long can you sit comfortably?  depends    How long can you stand comfortably?  unlimited    How long can you walk comfortably?  unlimited    Patient Stated Goals  decrease pain, reach overhead, ride motorcycle, work on truck    Pain Onset  More than a month ago       THEREX Theraball roll outs 12x 5sec hold in end range with good mobility  Prone Y's 3x 10 with good carry over following min cuing for sequencing Lat [place] 25# x10; 35# 2x 10 with cuing initially to maintain scapular position with good success High row 10# x10 with some pain in this position Neutral row 20# 2x 10 in pain free range with cuing initially for scapular retraction with good carry over  Wall slides in bilat ER x12 with cuing for technique and with maintained scapular retraction with good success  MANTHER -Grade I/II mob with movement in Flexion and ABD inferior and posterior glides 3x30 sec ; Grade III for increased motion in each direction 6x 30sec - STM with trigger point release to supraspinatus/UT with noted trigger point  reduction -Pt notes decreased stiffness and no painful arc after manual therapy                        PT Education - 05/09/19 0738    Education Details  therex form    Person(s) Educated  Patient    Methods  Explanation;Demonstration;Verbal cues    Comprehension  Verbalized understanding;Returned demonstration;Verbal cues required       PT Short Term Goals - 04/25/19 1055      PT SHORT TERM GOAL #1   Title  In 4 weeks, pt will decrease pain score by 2 points in order to show clinically significant improvement in pain.    Baseline  04/25/2019; Worst: 5-6/10    Time  4    Period  Weeks    Status  New    Target Date  05/23/19      PT SHORT TERM GOAL #2   Title  In 4 weeks, pt will be able to sleep through the night without shoulder pain.    Baseline  04/25/2019; pain wakes him up in the night    Time  4    Period  Weeks    Status  New    Target Date  05/23/19        PT Long Term Goals - 04/25/19 1057      PT LONG TERM GOAL #1   Title  In 8 weeks, pt will improve ROM in R shoulder WNL without pain in order to improve functional mobility for ADLs and work.    Baseline  04/25/2019; AROM Flexion 150d and painful; ABD 130d painful; IR L1    Time  8    Period  Weeks    Status  New    Target Date  06/20/19      PT LONG TERM GOAL #2   Title  In 8 weeks, pt will improve gross strength of periscapular musculature to 5/5 in order to maintain proper shoulder girdle posture and improve motor control.    Baseline  04/25/2019; Is (3/5; painful), T's (4-/5; painful), Y's (4+/5; painful)    Time  8    Period  Weeks    Status  New    Target Date  06/20/19      PT LONG TERM GOAL #3   Title  In 8 weeks, pt will decrease FOTO score to 73 in order to demonstrate predicted functional level    Baseline  04/25/2019 60    Time  8    Period  Weeks    Status  New    Target Date  06/20/19            Plan - 05/09/19 0754    Clinical Impression Statement  PT  continued to utilize manual  techniques to reduce pain and increase overhead motion with good success. PT continued therex progression for periscapular activation, with cuing needed for technique and scapulo-humeral rhythm, with good carry over from patient. Patient motivated throughout session and with full AROM without pain following. PT will continue progression as able.    Personal Factors and Comorbidities  Fitness;Profession;Time since onset of injury/illness/exacerbation;Comorbidity 3+    Comorbidities  Hyperlipidemia, hypertension, insomnia, thrombocytopenia    Examination-Activity Limitations  Reach Overhead;Hygiene/Grooming;Lift;Carry;Dressing    Examination-Participation Restrictions  Yard Work;Cleaning;Other    Stability/Clinical Decision Making  Evolving/Moderate complexity    Clinical Decision Making  Moderate    Rehab Potential  Good    PT Frequency  1x / week    PT Duration  8 weeks    PT Treatment/Interventions  ADLs/Self Care Home Management;Cryotherapy;Electrical Stimulation;Traction;Ultrasound;Functional mobility training;Therapeutic activities;Therapeutic exercise;Neuromuscular re-education;Manual techniques;Passive range of motion;Joint Manipulations;Spinal Manipulations;Dry needling;Moist Heat;Patient/family education;Iontophoresis 4mg /ml Dexamethasone;DME Instruction    PT Next Visit Plan  perascapular strengthening, pain control    PT Home Exercise Plan  scapular retractions, pec door frame stretch, book openings, postural correction, standing towel IR/ER stretch    Consulted and Agree with Plan of Care  Patient       Patient will benefit from skilled therapeutic intervention in order to improve the following deficits and impairments:  Decreased endurance, Decreased range of motion, Decreased strength, Pain, Decreased mobility, Postural dysfunction  Visit Diagnosis: Shoulder impingement syndrome, right  Rotator cuff dysfunction, right  Rotator cuff impingement syndrome  of right shoulder     Problem List Patient Active Problem List   Diagnosis Date Noted  . Thrombocytopenia ([place]) 10/09/2018  . Renal cyst 10/09/2018  . Sleep apnea 09/26/2017  . Headache 09/26/2017  . Polyneuropathy 07/18/2017  . Abdominal pain 07/13/2017  . CTS (carpal tunnel syndrome) 02/05/2017  . Numbness in feet 10/27/2016  . Vitamin D deficiency 10/27/2016  . Health care maintenance 11/19/2015  . Acute bilateral low back pain without sciatica 07/18/2015  . Sinusitis, acute maxillary 07/18/2015  . History of hay fever 10/23/2014  . GERD (gastroesophageal reflux disease) 10/23/2014  . Cephalalgia 10/23/2014  . Dysmetabolic syndrome 123456  . Hyperglycemia 10/23/2014  . Hyperlipidemia 10/23/2014  . External thrombosed hemorrhoids 04/06/2008  . 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 Cannot sleep 04/19/2007  . Benign essential HTN 08/05/2006    Shelton Silvas 05/09/2019, 8:10 AM  [facility] [address], [place], [address] Phone: [phone]   Fax:  [phone]  Name: Clifford Alexander MRN: [id number] Date of Birth: 08/26/1965

## 2019-05-16 ENCOUNTER — Ambulatory Visit: Payer: Managed Care, Other (non HMO) | Admitting: Physical Therapy

## 2019-05-16 ENCOUNTER — Other Ambulatory Visit: Payer: Self-pay

## 2019-05-16 ENCOUNTER — Encounter: Payer: Self-pay | Admitting: Physical Therapy

## 2019-05-16 DIAGNOSIS — M67911 Unspecified disorder of synovium and tendon, right shoulder: Secondary | ICD-10-CM

## 2019-05-16 DIAGNOSIS — M7541 Impingement syndrome of right shoulder: Secondary | ICD-10-CM

## 2019-05-16 NOTE — Therapy (Addendum)
[facility] [address], [place], [address] Phone: [phone]   Fax:  [phone]  Physical Therapy Treatment  Patient Details  Name: Clifford Alexander MRN: [id number] Date of Birth: 02/23/1966 No data recorded  Encounter Date: 05/16/2019  PT End of Session - 05/16/19 0739    Visit Number  4    Number of Visits  17    Date for PT Re-Evaluation  06/20/19    PT Start Time  0730    PT Stop Time  0810    PT Time Calculation (min)  40 min    Activity Tolerance  Patient tolerated treatment well    Behavior During Therapy  [facility] for tasks assessed/performed       Past Medical History:  Diagnosis Date  . Bradycardia   . CPAP (continuous positive airway pressure) dependence   . Fatigue 06/23/2008  . Hyperlipidemia   . Hypertension   . Insomnia 04/19/2007  . Neuromuscular disorder (HCC)    neuropathy in feet  . Shingles 01/2016  . Sleep apnea   . Thrombocytopenia ([place])     Past Surgical History:  Procedure Laterality Date  . LUMBAR LAMINECTOMY  04/22/2007   L4-L5   discectomy 03/2008    There were no vitals filed for this visit.  Subjective Assessment - 05/16/19 0731    Subjective  Pt reports he is doing well today with no pain or soreness today. Pt reports good adherence to HEP, but still struggles with the towel stretch IR/ER. Pt seems to notice improvement with popping and clicking sensations, reporting very minimal amount of this overall.    Pertinent History  Pt is a 54 year old male with C/C of R shoulder pain. Pt reports pain started in October of 2020 while he was working on his truck. He was on his back under the truck and as he reached up, he "felt like it overextended" and felt pain. Pain has been improving since oct and pt states he has seen a "50% improvement" in pain. Currently, pain is 0/10, but at its worst gets to 5-6/10. Pt describes pain as achy and dull that stays in his shoulder but will  occasionally radiate down below the elbow. 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 Pt reports pain that wakes him up during the night, but able to fall back asleep with changing position. Easing factors include changing arm position, applying topical glutathione, and stretches for extension. 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 Pt enjoys riding his motorcycle, which he has been unable to do since his injury. Pt goals include reducing pain and increasing strength so he can sleep/drive without pain, work on his truck, and ride his motorcycle pain-free. Pt does not have a history of cancer, heart attack, seizure, or stroke. Pt denies numbness and tingling, sensation changes, bowel/bladder abnormalities, saddle paresthesia, fever/night sweats, or unexplained weight loss or weight gain.    Limitations  Sitting;House hold activities;Lifting;Other (comment)    How long can you sit comfortably?  depends    How long can you stand comfortably?  unlimited    How long can you walk comfortably?  unlimited    Patient Stated Goals  decrease pain, reach overhead, ride motorcycle, work on truck    Pain Onset  More than a month ago       [place] -Supine ER  slide to [facility] press stretch 4# 2x8 for gentle stretching and dynamic warm-up -Supine IR/ER on mat table 4# 2x8 ea position -Prone Ys 2# 3x5,6,7 with good carryover for motor control -Standing Rows 20# 3x10 good carryover for scapular retraction -Lat pull downs 35#, 55# 3x8 good carryover for motor control -Wall walks with band yellow TB 2x6 with heaving cueing to keep elbows in and wrists out -Standing stretching with TRX for flexion and ABD x30 sec -Standing doorway pec stretch x30 sec                       PT Education - 05/16/19 0738    Education Details  therex form    Person(s) Educated  Patient    Methods   Explanation;Demonstration;Verbal cues    Comprehension  Verbalized understanding;Verbal cues required       PT Short Term Goals - 04/25/19 1055      PT SHORT TERM GOAL #1   Title  In 4 weeks, pt will decrease pain score by 2 points in order to show clinically significant improvement in pain.    Baseline  04/25/2019; Worst: 5-6/10    Time  4    Period  Weeks    Status  New    Target Date  05/23/19      PT SHORT TERM GOAL #2   Title  In 4 weeks, pt will be able to sleep through the night without shoulder pain.    Baseline  04/25/2019; pain wakes him up in the night    Time  4    Period  Weeks    Status  New    Target Date  05/23/19        PT Long Term Goals - 04/25/19 1057      PT LONG TERM GOAL #1   Title  In 8 weeks, pt will improve ROM in R shoulder WNL without pain in order to improve functional mobility for ADLs and work.    Baseline  04/25/2019; AROM Flexion 150d and painful; ABD 130d painful; IR L1    Time  8    Period  Weeks    Status  New    Target Date  06/20/19      PT LONG TERM GOAL #2   Title  In 8 weeks, pt will improve gross strength of periscapular musculature to 5/5 in order to maintain proper shoulder girdle posture and improve motor control.    Baseline  04/25/2019; Is (3/5; painful), T's (4-/5; painful), Y's (4+/5; painful)    Time  8    Period  Weeks    Status  New    Target Date  06/20/19      PT LONG TERM GOAL #3   Title  In 8 weeks, pt will decrease FOTO score to 73 in order to demonstrate predicted functional level    Baseline  04/25/2019 60    Time  8    Period  Weeks    Status  New    Target Date  06/20/19            Plan - 05/16/19 0748    Clinical Impression Statement  PT progressed strength therex with good response from pt. PT forewent manual therapy due to pt reporting no pain today. Pt has good carryover for motor control and technique. Pt is making good progress with strength, mobility, and pain reduction and will continue to  benefit from strength training for RTC and parascapular  muscles. PT will continue progressions as appropriate.    Personal Factors and Comorbidities  Fitness;Profession;Time since onset of injury/illness/exacerbation;Comorbidity 3+    Comorbidities  Hyperlipidemia, hypertension, insomnia, thrombocytopenia    Examination-Activity Limitations  Reach Overhead;Hygiene/Grooming;Lift;Carry;Dressing    Examination-Participation Restrictions  Yard Work;Cleaning;Other    Stability/Clinical Decision Making  Evolving/Moderate complexity    Clinical Decision Making  Moderate    Rehab Potential  Good    PT Frequency  1x / week    PT Duration  8 weeks    PT Treatment/Interventions  ADLs/Self Care Home Management;Cryotherapy;Electrical Stimulation;Traction;Ultrasound;Functional mobility training;Therapeutic activities;Therapeutic exercise;Neuromuscular re-education;Manual techniques;Passive range of motion;Joint Manipulations;Spinal Manipulations;Dry needling;Moist Heat;Patient/family education;Iontophoresis 4mg /ml Dexamethasone;DME Instruction    PT Next Visit Plan  perascapular strengthening, pain control    PT Home Exercise Plan  scapular retractions, pec door frame stretch, book openings, postural correction, standing towel IR/ER stretch    Consulted and Agree with Plan of Care  Patient       Patient will benefit from skilled therapeutic intervention in order to improve the following deficits and impairments:  Decreased endurance, Decreased range of motion, Decreased strength, Pain, Decreased mobility, Postural dysfunction  Visit Diagnosis: Shoulder impingement syndrome, right  Rotator cuff dysfunction, right  Rotator cuff impingement syndrome of right shoulder     Problem List Patient Active Problem List   Diagnosis Date Noted  . Thrombocytopenia ([place]) 10/09/2018  . Renal cyst 10/09/2018  . Sleep apnea 09/26/2017  . Headache 09/26/2017  . Polyneuropathy 07/18/2017  . Abdominal pain  07/13/2017  . CTS (carpal tunnel syndrome) 02/05/2017  . Numbness in feet 10/27/2016  . Vitamin D deficiency 10/27/2016  . Health care maintenance 11/19/2015  . Acute bilateral low back pain without sciatica 07/18/2015  . Sinusitis, acute maxillary 07/18/2015  . History of hay fever 10/23/2014  . GERD (gastroesophageal reflux disease) 10/23/2014  . Cephalalgia 10/23/2014  . Dysmetabolic syndrome 123456  . Hyperglycemia 10/23/2014  . Hyperlipidemia 10/23/2014  . External thrombosed hemorrhoids 04/06/2008  . 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 Cannot sleep 04/19/2007  . Benign essential HTN 08/05/2006   Shelton Silvas PT, DPT Ivin Booty, SPT Shelton Silvas 05/16/2019, 10:03 AM  [facility] [address], [place], [address] Phone: [phone]   Fax:  [phone]  Name: Clifford Alexander MRN: [id number] Date of Birth: [birth year]

## 2019-05-23 ENCOUNTER — Other Ambulatory Visit: Payer: Self-pay

## 2019-05-23 ENCOUNTER — Encounter: Payer: Self-pay | Admitting: Physical Therapy

## 2019-05-23 ENCOUNTER — Ambulatory Visit: Payer: Managed Care, Other (non HMO) | Admitting: Physical Therapy

## 2019-05-23 DIAGNOSIS — M7541 Impingement syndrome of right shoulder: Secondary | ICD-10-CM | POA: Diagnosis not present

## 2019-05-23 DIAGNOSIS — M67911 Unspecified disorder of synovium and tendon, right shoulder: Secondary | ICD-10-CM

## 2019-05-23 NOTE — Therapy (Cosign Needed Addendum)
[facility] [address], [place], [address] Phone: [phone]   Fax:  [phone]  Physical Therapy Treatment  Patient Details  Name: Clifford Alexander MRN: [id number] Date of Birth: [birth year] No data recorded  Encounter Date: 05/23/2019  PT End of Session - 05/23/19 0918    Visit Number  5    Number of Visits  17    Date for PT Re-Evaluation  06/20/19    PT Start Time  0730    PT Stop Time  0810    PT Time Calculation (min)  40 min    Activity Tolerance  Patient tolerated treatment well    Behavior During Therapy  [facility] for tasks assessed/performed       Past Medical History:  Diagnosis Date  . Bradycardia   . CPAP (continuous positive airway pressure) dependence   . Fatigue 06/23/2008  . Hyperlipidemia   . Hypertension   . Insomnia 04/19/2007  . Neuromuscular disorder (HCC)    neuropathy in feet  . Shingles 01/2016  . Sleep apnea   . Thrombocytopenia ([place])     Past Surgical History:  Procedure Laterality Date  . LUMBAR LAMINECTOMY  04/22/2007   L4-L5   discectomy 03/2008    There were no vitals filed for this visit.  Subjective Assessment - 05/23/19 0735    Subjective  Pt reports no pain today, but some soreness that lasted about a day and a half. Pt has some "flare-ups" this weekend he atrributes to reaching too far behind in the truck, but not too painful and resolved quickly. Pt reports it has increased clicking and popping this weekend, but no pain. Pt HEP has been going well.    Pertinent History  Pt is a 54 year old male with C/C of R shoulder pain. Pt reports pain started in October of 2020 while he was working on his truck. He was on his back under the truck and as he reached up, he "felt like it overextended" and felt pain. Pain has been improving since oct and pt states he has seen a "50% improvement" in pain. Currently, pain is 0/10, but at its worst gets to 5-6/10. Pt describes pain as  achy and dull that stays in his shoulder but will occasionally radiate down below the elbow. 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 Pt reports pain that wakes him up during the night, but able to fall back asleep with changing position. Easing factors include changing arm position, applying topical glutathione, and stretches for extension. 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 Pt enjoys riding his motorcycle, which he has been unable to do since his injury. Pt goals include reducing pain and increasing strength so he can sleep/drive without pain, work on his truck, and ride his motorcycle pain-free. Pt does not have a history of cancer, heart attack, seizure, or stroke. Pt denies numbness and tingling, sensation changes, bowel/bladder abnormalities, saddle paresthesia, fever/night sweats, or unexplained weight loss or weight gain.    Limitations  Sitting;House hold activities;Lifting;Other (comment)    How long can you sit comfortably?  depends    How long can you stand comfortably?  unlimited    How long can you walk comfortably?  unlimited    Patient Stated Goals  decrease pain, reach overhead, ride motorcycle, work on truck  THEREX -Supine snow angel ABD on mat table 3x8 for dynamic stretching and motion -Prone Ys 2# 3x7 with good carryover for scapular control -Foam roller on wall with YTB 3x10 with cueing for pulling scapula down and back with good control  -Lat pull down 55#, 3x8 cueing for scapular retraction; pt reports some aggravation  -Standing isometric in door frame ER 4x15 sec for decreasing pain and gentle strength -Small body blade 4x15 sec tactile cueing for pulling shoulder down and back with good adherence -T-spine opener on foam roller 3x30, 60 sec for increasing thoracic ROM    -PT added new home exercise of wall walks with GTB 3x10 2-3 days per  week                      PT Education - 05/23/19 0750    Education Details  therex form, scapular retraction, new HEP    Person(s) Educated  Patient    Methods  Explanation;Tactile cues;Demonstration    Comprehension  Verbalized understanding;Returned demonstration       PT Short Term Goals - 04/25/19 1055      PT SHORT TERM GOAL #1   Title  In 4 weeks, pt will decrease pain score by 2 points in order to show clinically significant improvement in pain.    Baseline  04/25/2019; Worst: 5-6/10    Time  4    Period  Weeks    Status  New    Target Date  05/23/19      PT SHORT TERM GOAL #2   Title  In 4 weeks, pt will be able to sleep through the night without shoulder pain.    Baseline  04/25/2019; pain wakes him up in the night    Time  4    Period  Weeks    Status  New    Target Date  05/23/19        PT Long Term Goals - 04/25/19 1057      PT LONG TERM GOAL #1   Title  In 8 weeks, pt will improve ROM in R shoulder WNL without pain in order to improve functional mobility for ADLs and work.    Baseline  04/25/2019; AROM Flexion 150d and painful; ABD 130d painful; IR L1    Time  8    Period  Weeks    Status  New    Target Date  06/20/19      PT LONG TERM GOAL #2   Title  In 8 weeks, pt will improve gross strength of periscapular musculature to 5/5 in order to maintain proper shoulder girdle posture and improve motor control.    Baseline  04/25/2019; Is (3/5; painful), T's (4-/5; painful), Y's (4+/5; painful)    Time  8    Period  Weeks    Status  New    Target Date  06/20/19      PT LONG TERM GOAL #3   Title  In 8 weeks, pt will decrease FOTO score to 73 in order to demonstrate predicted functional level    Baseline  04/25/2019 60    Time  8    Period  Weeks    Status  New    Target Date  06/20/19            Plan - 05/23/19 0759    Clinical Impression Statement  PT progressed strength therex with good carryover from pt. Pt pain has been  well-managed, so PT forewent manual  therapy again today. PT notes improvment in decreasing pain-free range between sessions. Pt is making good progress with strength gains and will continue benefit from attending PT. Next session, PT will continue progressions as needed.    Personal Factors and Comorbidities  Fitness;Profession;Time since onset of injury/illness/exacerbation;Comorbidity 3+    Comorbidities  Hyperlipidemia, hypertension, insomnia, thrombocytopenia    Examination-Activity Limitations  Reach Overhead;Hygiene/Grooming;Lift;Carry;Dressing    Examination-Participation Restrictions  Yard Work;Cleaning;Other    Stability/Clinical Decision Making  Evolving/Moderate complexity    Clinical Decision Making  Moderate    Rehab Potential  Good    PT Frequency  1x / week    PT Duration  8 weeks    PT Treatment/Interventions  ADLs/Self Care Home Management;Cryotherapy;Electrical Stimulation;Traction;Ultrasound;Functional mobility training;Therapeutic activities;Therapeutic exercise;Neuromuscular re-education;Manual techniques;Passive range of motion;Joint Manipulations;Spinal Manipulations;Dry needling;Moist Heat;Patient/family education;Iontophoresis 4mg /ml Dexamethasone;DME Instruction    PT Next Visit Plan  parascapular strength    PT Home Exercise Plan  scapular retractions, pec door frame stretch, book openings, postural correction, standing towel IR/ER stretch    Consulted and Agree with Plan of Care  Patient       Patient will benefit from skilled therapeutic intervention in order to improve the following deficits and impairments:  Decreased endurance, Decreased range of motion, Decreased strength, Pain, Decreased mobility, Postural dysfunction  Visit Diagnosis: Shoulder impingement syndrome, right  Rotator cuff dysfunction, right  Rotator cuff impingement syndrome of right shoulder     Problem List Patient Active Problem List   Diagnosis Date Noted  . Thrombocytopenia ([place])  10/09/2018  . Renal cyst 10/09/2018  . Sleep apnea 09/26/2017  . Headache 09/26/2017  . Polyneuropathy 07/18/2017  . Abdominal pain 07/13/2017  . CTS (carpal tunnel syndrome) 02/05/2017  . Numbness in feet 10/27/2016  . Vitamin D deficiency 10/27/2016  . Health care maintenance 11/19/2015  . Acute bilateral low back pain without sciatica 07/18/2015  . Sinusitis, acute maxillary 07/18/2015  . History of hay fever 10/23/2014  . GERD (gastroesophageal reflux disease) 10/23/2014  . Cephalalgia 10/23/2014  . Dysmetabolic syndrome 123456  . Hyperglycemia 10/23/2014  . Hyperlipidemia 10/23/2014  . External thrombosed hemorrhoids 04/06/2008  . 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 Cannot sleep 04/19/2007  . Benign essential HTN 08/05/2006   Shelton Silvas PT, DPT Ivin Booty, SPT Shelton Silvas 05/23/2019, 9:52 AM  [facility] [address], [place], [address] Phone: [phone]   Fax:  [phone]  Name: Clifford Alexander MRN: [id number] Date of Birth: February 19, 1966

## 2019-05-30 ENCOUNTER — Ambulatory Visit: Payer: Managed Care, Other (non HMO) | Attending: Internal Medicine | Admitting: Physical Therapy

## 2019-05-30 ENCOUNTER — Other Ambulatory Visit: Payer: Self-pay

## 2019-05-30 ENCOUNTER — Encounter: Payer: Self-pay | Admitting: Physical Therapy

## 2019-05-30 DIAGNOSIS — M67911 Unspecified disorder of synovium and tendon, right shoulder: Secondary | ICD-10-CM | POA: Diagnosis present

## 2019-05-30 DIAGNOSIS — M7541 Impingement syndrome of right shoulder: Secondary | ICD-10-CM | POA: Insufficient documentation

## 2019-05-30 NOTE — Therapy (Signed)
[facility] [address] [place], [address] Phone: [phone]   Fax:  [phone]  Physical Therapy Treatment  Patient Details  Name: Clifford Alexander MRN: [id number] Date of Birth: 02-Jun-1965 No data recorded  Encounter Date: 05/30/2019  PT End of Session - 05/30/19 0744    Visit Number  6    Number of Visits  17    Date for PT Re-Evaluation  06/20/19    PT Start Time  0730    PT Stop Time  0810    PT Time Calculation (min)  40 min    Activity Tolerance  Patient tolerated treatment well    Behavior During Therapy  [facility] for tasks assessed/performed       Past Medical History:  Diagnosis Date  . Bradycardia   . CPAP (continuous positive airway pressure) dependence   . Fatigue 06/23/2008  . Hyperlipidemia   . Hypertension   . Insomnia 04/19/2007  . Neuromuscular disorder (HCC)    neuropathy in feet  . Shingles 01/2016  . Sleep apnea   . Thrombocytopenia ([place])     Past Surgical History:  Procedure Laterality Date  . LUMBAR LAMINECTOMY  04/22/2007   L4-L5   discectomy 03/2008    There were no vitals filed for this visit.  Subjective Assessment - 05/30/19 0734    Subjective  Patient reports some pain with laying in bed on stomach/side with LUE overhead. Reports he only had one episode of popping/clicking over the past week and that in general these are not frequent and are no longer painful    Pertinent History  Pt is a 54 year old male with C/C of R shoulder pain. Pt reports pain started in October of 2020 while he was working on his truck. He was on his back under the truck and as he reached up, he "felt like it overextended" and felt pain. Pain has been improving since oct and pt states he has seen a "50% improvement" in pain. Currently, pain is 0/10, but at its worst gets to 5-6/10. Pt describes pain as achy and dull that stays in his shoulder but will occasionally radiate down below the elbow.  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 Pt reports pain that wakes him up during the night, but able to fall back asleep with changing position. Easing factors include changing arm position, applying topical glutathione, and stretches for extension. 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 Pt enjoys riding his motorcycle, which he has been unable to do since his injury. Pt goals include reducing pain and increasing strength so he can sleep/drive without pain, work on his truck, and ride his motorcycle pain-free. Pt does not have a history of cancer, heart attack, seizure, or stroke. Pt denies numbness and tingling, sensation changes, bowel/bladder abnormalities, saddle paresthesia, fever/night sweats, or unexplained weight loss or weight gain.    Limitations  Sitting;House hold activities;Lifting;Other (comment)    How long can you sit comfortably?  depends    How long can you stand comfortably?  unlimited    How long can you walk comfortably?  unlimited    Patient Stated Goals  decrease pain, reach overhead, ride motorcycle, work on truck    Pain Onset  More than a month ago       [place] 24min forward 99min back  -  Scaption 3# 3x 10 with heavy cuing to prevent scapular elevation and retraction with decent carry over -Seated military press 4# 3x 10 with cuing for scapular motion with good carry over with TC of chair and no increased pain  - Eccentric bicep curl 5# 3x 10 with cuing for eccentric "slow lower" with full ext at lower with good carry over - Neutral rows 25# x10 45# 2x 10 with cuing for maintaining scapular retraction with elbow ext with good carry over -Lat pull down 55#, 3x8 cueing for scapular retraction; pt reports some aggravation                          PT Education - 05/30/19 0741    Education Details  therex form/technique    Person(s)  Educated  Patient    Methods  Explanation;Demonstration;Verbal cues;Tactile cues    Comprehension  Verbalized understanding;Returned demonstration;Verbal cues required;Tactile cues required       PT Short Term Goals - 04/25/19 1055      PT SHORT TERM GOAL #1   Title  In 4 weeks, pt will decrease pain score by 2 points in order to show clinically significant improvement in pain.    Baseline  04/25/2019; Worst: 5-6/10    Time  4    Period  Weeks    Status  New    Target Date  05/23/19      PT SHORT TERM GOAL #2   Title  In 4 weeks, pt will be able to sleep through the night without shoulder pain.    Baseline  04/25/2019; pain wakes him up in the night    Time  4    Period  Weeks    Status  New    Target Date  05/23/19        PT Long Term Goals - 04/25/19 1057      PT LONG TERM GOAL #1   Title  In 8 weeks, pt will improve ROM in R shoulder WNL without pain in order to improve functional mobility for ADLs and work.    Baseline  04/25/2019; AROM Flexion 150d and painful; ABD 130d painful; IR L1    Time  8    Period  Weeks    Status  New    Target Date  06/20/19      PT LONG TERM GOAL #2   Title  In 8 weeks, pt will improve gross strength of periscapular musculature to 5/5 in order to maintain proper shoulder girdle posture and improve motor control.    Baseline  04/25/2019; Is (3/5; painful), T's (4-/5; painful), Y's (4+/5; painful)    Time  8    Period  Weeks    Status  New    Target Date  06/20/19      PT LONG TERM GOAL #3   Title  In 8 weeks, pt will decrease FOTO score to 73 in order to demonstrate predicted functional level    Baseline  04/25/2019 60    Time  8    Period  Weeks    Status  New    Target Date  06/20/19            Plan - 05/30/19 0750    Clinical Impression Statement  PT continued therex progression for scapulo humeral rhythm with introduction of overhead motions against gravity with good success. Patient is motivated to comply with cuing for  technique, but does have difficulty maintaining proper  technique with open chain overhead. Pt reports no pain throughout session, good muscle fatigue at the end of sets.    Personal Factors and Comorbidities  Fitness;Profession;Time since onset of injury/illness/exacerbation;Comorbidity 3+    Comorbidities  Hyperlipidemia, hypertension, insomnia, thrombocytopenia    Examination-Activity Limitations  Reach Overhead;Hygiene/Grooming;Lift;Carry;Dressing    Examination-Participation Restrictions  Yard Work;Cleaning;Other    Stability/Clinical Decision Making  Evolving/Moderate complexity    Clinical Decision Making  Moderate    Rehab Potential  Good    PT Frequency  1x / week    PT Duration  8 weeks    PT Treatment/Interventions  ADLs/Self Care Home Management;Cryotherapy;Electrical Stimulation;Traction;Ultrasound;Functional mobility training;Therapeutic activities;Therapeutic exercise;Neuromuscular re-education;Manual techniques;Passive range of motion;Joint Manipulations;Spinal Manipulations;Dry needling;Moist Heat;Patient/family education;Iontophoresis 4mg /ml Dexamethasone;DME Instruction    PT Next Visit Plan  parascapular strength    PT Home Exercise Plan  scapular retractions, pec door frame stretch, book openings, postural correction, standing towel IR/ER stretch    Consulted and Agree with Plan of Care  Patient       Patient will benefit from skilled therapeutic intervention in order to improve the following deficits and impairments:  Decreased endurance, Decreased range of motion, Decreased strength, Pain, Decreased mobility, Postural dysfunction  Visit Diagnosis: Shoulder impingement syndrome, right  Rotator cuff dysfunction, right  Rotator cuff impingement syndrome of right shoulder     Problem List Patient Active Problem List   Diagnosis Date Noted  . Thrombocytopenia ([place]) 10/09/2018  . Renal cyst 10/09/2018  . Sleep apnea 09/26/2017  . Headache 09/26/2017  .  Polyneuropathy 07/18/2017  . Abdominal pain 07/13/2017  . CTS (carpal tunnel syndrome) 02/05/2017  . Numbness in feet 10/27/2016  . Vitamin D deficiency 10/27/2016  . Health care maintenance 11/19/2015  . Acute bilateral low back pain without sciatica 07/18/2015  . Sinusitis, acute maxillary 07/18/2015  . History of hay fever 10/23/2014  . GERD (gastroesophageal reflux disease) 10/23/2014  . Cephalalgia 10/23/2014  . Dysmetabolic syndrome 123456  . Hyperglycemia 10/23/2014  . Hyperlipidemia 10/23/2014  . External thrombosed hemorrhoids 04/06/2008  . 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 Cannot sleep 04/19/2007  . Benign essential HTN 08/05/2006   Shelton Silvas PT, DPT Shelton Silvas 05/30/2019, 8:10 AM  [facility] [address] [place], [address] Phone: [phone]   Fax:  [phone]  Name: Clifford Alexander MRN: [id number] Date of Birth: [birth year]

## 2019-06-06 ENCOUNTER — Ambulatory Visit: Payer: Managed Care, Other (non HMO) | Admitting: Physical Therapy

## 2019-06-06 ENCOUNTER — Other Ambulatory Visit: Payer: Self-pay

## 2019-06-06 ENCOUNTER — Encounter: Payer: Self-pay | Admitting: Physical Therapy

## 2019-06-06 DIAGNOSIS — M7541 Impingement syndrome of right shoulder: Secondary | ICD-10-CM

## 2019-06-06 DIAGNOSIS — M67911 Unspecified disorder of synovium and tendon, right shoulder: Secondary | ICD-10-CM

## 2019-06-06 NOTE — Therapy (Signed)
[facility] [address], [place], [address] Phone: [phone]   Fax:  [phone]  Physical Therapy Treatment  Patient Details  Name: Clifford Alexander MRN: [id number] Date of Birth: 06-03-1965 No data recorded  Encounter Date: 06/06/2019  PT End of Session - 06/06/19 0736    Visit Number  7    Number of Visits  17    Date for PT Re-Evaluation  06/20/19    PT Start Time  0732    PT Stop Time  0810    PT Time Calculation (min)  38 min    Activity Tolerance  Patient tolerated treatment well    Behavior During Therapy  [facility] for tasks assessed/performed       Past Medical History:  Diagnosis Date  . Bradycardia   . CPAP (continuous positive airway pressure) dependence   . Fatigue 06/23/2008  . Hyperlipidemia   . Hypertension   . Insomnia 04/19/2007  . Neuromuscular disorder (HCC)    neuropathy in feet  . Shingles 01/2016  . Sleep apnea   . Thrombocytopenia ([place])     Past Surgical History:  Procedure Laterality Date  . LUMBAR LAMINECTOMY  04/22/2007   L4-L5   discectomy 03/2008    There were no vitals filed for this visit.  Subjective Assessment - 06/06/19 0735    Subjective  Patinet reports minimal compliance with HEP, which he attributes increased "popping and clikcing" sensations in the shoulder from this week. Continues to report minimal pain.    Pertinent History  Pt is a 54 year old male with C/C of R shoulder pain. Pt reports pain started in October of 2020 while he was working on his truck. He was on his back under the truck and as he reached up, he "felt like it overextended" and felt pain. Pain has been improving since oct and pt states he has seen a "50% improvement" in pain. Currently, pain is 0/10, but at its worst gets to 5-6/10. Pt describes pain as achy and dull that stays in his shoulder but will occasionally radiate down below the elbow. 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 Pt reports pain that wakes him up during the night, but able to fall back asleep with changing position. Easing factors include changing arm position, applying topical glutathione, and stretches for extension. 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 Pt enjoys riding his motorcycle, which he has been unable to do since his injury. Pt goals include reducing pain and increasing strength so he can sleep/drive without pain, work on his truck, and ride his motorcycle pain-free. Pt does not have a history of cancer, heart attack, seizure, or stroke. Pt denies numbness and tingling, sensation changes, bowel/bladder abnormalities, saddle paresthesia, fever/night sweats, or unexplained weight loss or weight gain.    Limitations  Sitting;House hold activities;Lifting;Other (comment)    How long can you sit comfortably?  depends    How long can you stand comfortably?  unlimited    How long can you walk comfortably?  unlimited    Patient Stated Goals  decrease pain, reach overhead, ride motorcycle, work on truck    Pain Onset  More than a month ago         [place] 23min forward 29min back  - Eccentric bicep curl 5# x10; 6# 2x 10 with cuing for  full elbow ext wit good carry over - High rows 20# 3x 10 with mild pain on the return that subsides, TC initially for technique with good carry over following -Lat pull down 55#, 3x8 cueing for maintaining scapular retraciton with decent carry over - Scaption 3# 3x 10/9/8 with heavy cuing to prevent scapular elevation and retraction with decent carry over -Seated military press 5# 3x 10 with cuing for scapular motion with good carry over with TC of chair and no increased pain                          PT Education - 06/06/19 0738    Education Details  therex form/technique    Person(s) Educated  Patient    Methods   Explanation;Demonstration;Verbal cues;Tactile cues    Comprehension  Verbalized understanding;Returned demonstration;Verbal cues required;Tactile cues required       PT Short Term Goals - 04/25/19 1055      PT SHORT TERM GOAL #1   Title  In 4 weeks, pt will decrease pain score by 2 points in order to show clinically significant improvement in pain.    Baseline  04/25/2019; Worst: 5-6/10    Time  4    Period  Weeks    Status  New    Target Date  05/23/19      PT SHORT TERM GOAL #2   Title  In 4 weeks, pt will be able to sleep through the night without shoulder pain.    Baseline  04/25/2019; pain wakes him up in the night    Time  4    Period  Weeks    Status  New    Target Date  05/23/19        PT Long Term Goals - 04/25/19 1057      PT LONG TERM GOAL #1   Title  In 8 weeks, pt will improve ROM in R shoulder WNL without pain in order to improve functional mobility for ADLs and work.    Baseline  04/25/2019; AROM Flexion 150d and painful; ABD 130d painful; IR L1    Time  8    Period  Weeks    Status  New    Target Date  06/20/19      PT LONG TERM GOAL #2   Title  In 8 weeks, pt will improve gross strength of periscapular musculature to 5/5 in order to maintain proper shoulder girdle posture and improve motor control.    Baseline  04/25/2019; Is (3/5; painful), T's (4-/5; painful), Y's (4+/5; painful)    Time  8    Period  Weeks    Status  New    Target Date  06/20/19      PT LONG TERM GOAL #3   Title  In 8 weeks, pt will decrease FOTO score to 73 in order to demonstrate predicted functional level    Baseline  04/25/2019 60    Time  8    Period  Weeks    Status  New    Target Date  06/20/19            Plan - 06/06/19 0754    Clinical Impression Statement  PT continued therex progression with overhead motions with good success. Patient continues to need cuing for scapulo-humeral rhythm with decent carry over of cuing for successful muscle activation. Patient  has some discomfort with overhead motions, but is able to complete therex without pain. PT will  continue progression as able.    Personal Factors and Comorbidities  Fitness;Profession;Time since onset of injury/illness/exacerbation;Comorbidity 3+    Comorbidities  Hyperlipidemia, hypertension, insomnia, thrombocytopenia    Examination-Activity Limitations  Reach Overhead;Hygiene/Grooming;Lift;Carry;Dressing    Examination-Participation Restrictions  Yard Work;Cleaning;Other    Stability/Clinical Decision Making  Evolving/Moderate complexity    Clinical Decision Making  Moderate    Rehab Potential  Good    PT Frequency  1x / week    PT Duration  8 weeks    PT Treatment/Interventions  ADLs/Self Care Home Management;Cryotherapy;Electrical Stimulation;Traction;Ultrasound;Functional mobility training;Therapeutic activities;Therapeutic exercise;Neuromuscular re-education;Manual techniques;Passive range of motion;Joint Manipulations;Spinal Manipulations;Dry needling;Moist Heat;Patient/family education;Iontophoresis 4mg /ml Dexamethasone;DME Instruction    PT Next Visit Plan  parascapular strength    PT Home Exercise Plan  scapular retractions, pec door frame stretch, book openings, postural correction, standing towel IR/ER stretch    Consulted and Agree with Plan of Care  Patient       Patient will benefit from skilled therapeutic intervention in order to improve the following deficits and impairments:  Decreased endurance, Decreased range of motion, Decreased strength, Pain, Decreased mobility, Postural dysfunction  Visit Diagnosis: Shoulder impingement syndrome, right  Rotator cuff dysfunction, right     Problem List Patient Active Problem List   Diagnosis Date Noted  . Thrombocytopenia ([place]) 10/09/2018  . Renal cyst 10/09/2018  . Sleep apnea 09/26/2017  . Headache 09/26/2017  . Polyneuropathy 07/18/2017  . Abdominal pain 07/13/2017  . CTS (carpal tunnel syndrome) 02/05/2017  .  Numbness in feet 10/27/2016  . Vitamin D deficiency 10/27/2016  . Health care maintenance 11/19/2015  . Acute bilateral low back pain without sciatica 07/18/2015  . Sinusitis, acute maxillary 07/18/2015  . History of hay fever 10/23/2014  . GERD (gastroesophageal reflux disease) 10/23/2014  . Cephalalgia 10/23/2014  . Dysmetabolic syndrome 123456  . Hyperglycemia 10/23/2014  . Hyperlipidemia 10/23/2014  . External thrombosed hemorrhoids 04/06/2008  . 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 Cannot sleep 04/19/2007  . Benign essential HTN 08/05/2006   Shelton Silvas PT, DPT Shelton Silvas 06/06/2019, 8:09 AM  [facility] [address], [place], [address] Phone: [phone]   Fax:  [phone]  Name: JASAAN THIGPEN MRN: [id number] Date of Birth: [birth year]

## 2019-06-13 ENCOUNTER — Encounter: Payer: Self-pay | Admitting: Physical Therapy

## 2019-06-13 ENCOUNTER — Ambulatory Visit: Payer: Managed Care, Other (non HMO) | Admitting: Physical Therapy

## 2019-06-13 ENCOUNTER — Other Ambulatory Visit: Payer: Self-pay

## 2019-06-13 DIAGNOSIS — M7541 Impingement syndrome of right shoulder: Secondary | ICD-10-CM

## 2019-06-13 DIAGNOSIS — M67911 Unspecified disorder of synovium and tendon, right shoulder: Secondary | ICD-10-CM

## 2019-06-13 NOTE — Therapy (Signed)
[facility] [address], [place], [address] Phone: [phone]   Fax:  [phone]  Physical Therapy Treatment  Patient Details  Name: Clifford Alexander MRN: [id number] Date of Birth: 08-03-1965 No data recorded  Encounter Date: 06/13/2019  PT End of Session - 06/13/19 0759    Visit Number  8    Number of Visits  17    Date for PT Re-Evaluation  06/20/19    PT Start Time  0731    PT Stop Time  0812    PT Time Calculation (min)  41 min    Activity Tolerance  Patient tolerated treatment well    Behavior During Therapy  [facility] for tasks assessed/performed       Past Medical History:  Diagnosis Date  . Bradycardia   . CPAP (continuous positive airway pressure) dependence   . Fatigue 06/23/2008  . Hyperlipidemia   . Hypertension   . Insomnia 04/19/2007  . Neuromuscular disorder (HCC)    neuropathy in feet  . Shingles 01/2016  . Sleep apnea   . Thrombocytopenia ([place])     Past Surgical History:  Procedure Laterality Date  . LUMBAR LAMINECTOMY  04/22/2007   L4-L5   discectomy 03/2008    There were no vitals filed for this visit.  Subjective Assessment - 06/13/19 0735    Subjective  Patient reports his shoulder has been giving him more issue this week with increased lifting and reaching overhead working on his truck and lawnmower, that increased pain to 6/10 that day, and now it is 3/10    Pertinent History  Pt is a 54 year old male with C/C of R shoulder pain. Pt reports pain started in October of 2020 while he was working on his truck. He was on his back under the truck and as he reached up, he "felt like it overextended" and felt pain. Pain has been improving since oct and pt states he has seen a "50% improvement" in pain. Currently, pain is 0/10, but at its worst gets to 5-6/10. Pt describes pain as achy and dull that stays in his shoulder but will occasionally radiate down below the elbow. 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 Pt reports pain that wakes him up during the night, but able to fall back asleep with changing position. Easing factors include changing arm position, applying topical glutathione, and stretches for extension. 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 Pt enjoys riding his motorcycle, which he has been unable to do since his injury. Pt goals include reducing pain and increasing strength so he can sleep/drive without pain, work on his truck, and ride his motorcycle pain-free. Pt does not have a history of cancer, heart attack, seizure, or stroke. Pt denies numbness and tingling, sensation changes, bowel/bladder abnormalities, saddle paresthesia, fever/night sweats, or unexplained weight loss or weight gain.    Limitations  Sitting;House hold activities;Lifting;Other (comment)    How long can you sit comfortably?  depends    How long can you stand comfortably?  unlimited    How long can you walk comfortably?  unlimited    Patient Stated Goals  decrease pain, reach overhead, ride motorcycle, work on truck    Pain Onset  More than a month ago       [place] 11min forward 1min back  - ER in 90/90  5# 3x 10 with good carry over of demo and initial cuing for technique - Wall slides GTB 2x 10 with cuing for maintaining ER bilat with good carry over - Bent over flys 5# 2x 10/8 with good scapular retraction, cuing for eccentric control with good carry over  [facility] -Grade I/II mob with movement in Flexion, ER and ABD inferior and posterior glides 3x30 sec ; Grade III for increased motion in each direction - STM with trigger point release to supraspinatus. pec minor, UT with noted trigger point reduction Dry Needling: (2/2) 74mm/60mm .25 needles placed along the R UT and R pec minor to decrease increased muscular spasms and trigger points with the patient positioned in  supine using pincer grasp for each. Patient was educated on risks and benefits of therapy and verbally consents to PT.  -Pt notes decreased stiffness and no painful arc after manual therapy                      PT Education - 06/13/19 0759    Education Details  therex form    Person(s) Educated  Patient    Methods  Explanation;Demonstration;Tactile cues;Verbal cues    Comprehension  Verbalized understanding;Returned demonstration;Verbal cues required;Tactile cues required       PT Short Term Goals - 04/25/19 1055      PT SHORT TERM GOAL #1   Title  In 4 weeks, pt will decrease pain score by 2 points in order to show clinically significant improvement in pain.    Baseline  04/25/2019; Worst: 5-6/10    Time  4    Period  Weeks    Status  New    Target Date  05/23/19      PT SHORT TERM GOAL #2   Title  In 4 weeks, pt will be able to sleep through the night without shoulder pain.    Baseline  04/25/2019; pain wakes him up in the night    Time  4    Period  Weeks    Status  New    Target Date  05/23/19        PT Long Term Goals - 04/25/19 1057      PT LONG TERM GOAL #1   Title  In 8 weeks, pt will improve ROM in R shoulder WNL without pain in order to improve functional mobility for ADLs and work.    Baseline  04/25/2019; AROM Flexion 150d and painful; ABD 130d painful; IR L1    Time  8    Period  Weeks    Status  New    Target Date  06/20/19      PT LONG TERM GOAL #2   Title  In 8 weeks, pt will improve gross strength of periscapular musculature to 5/5 in order to maintain proper shoulder girdle posture and improve motor control.    Baseline  04/25/2019; Is (3/5; painful), T's (4-/5; painful), Y's (4+/5; painful)    Time  8    Period  Weeks    Status  New    Target Date  06/20/19      PT LONG TERM GOAL #3   Title  In 8 weeks, pt will decrease FOTO score to 73 in order to demonstrate predicted functional level    Baseline  04/25/2019 60    Time  8     Period  Weeks    Status  New    Target Date  06/20/19  Plan - 06/13/19 0807    Clinical Impression Statement  Patient with increased pain this session following working on vehicles this weekend, with increased R shoulder rounding, that patient is unable to correct. Following TDN and manual techniques patient with better shoulder positioning and decreased pain (1/10). PT is able to progress therex for posterior shoulder/scapular activation with success, with patient able to comply with all cuing for technique/muscle activation.    Personal Factors and Comorbidities  Fitness;Profession;Time since onset of injury/illness/exacerbation;Comorbidity 3+    Comorbidities  Hyperlipidemia, hypertension, insomnia, thrombocytopenia    Examination-Activity Limitations  Reach Overhead;Hygiene/Grooming;Lift;Carry;Dressing    Examination-Participation Restrictions  Yard Work;Cleaning;Other    Stability/Clinical Decision Making  Evolving/Moderate complexity    Clinical Decision Making  Moderate    Rehab Potential  Good    PT Frequency  1x / week    PT Duration  8 weeks    PT Treatment/Interventions  ADLs/Self Care Home Management;Cryotherapy;Electrical Stimulation;Traction;Ultrasound;Functional mobility training;Therapeutic activities;Therapeutic exercise;Neuromuscular re-education;Manual techniques;Passive range of motion;Joint Manipulations;Spinal Manipulations;Dry needling;Moist Heat;Patient/family education;Iontophoresis 4mg /ml Dexamethasone;DME Instruction    PT Next Visit Plan  parascapular strength    PT Home Exercise Plan  scapular retractions, pec door frame stretch, book openings, postural correction, standing towel IR/ER stretch    Consulted and Agree with Plan of Care  Patient       Patient will benefit from skilled therapeutic intervention in order to improve the following deficits and impairments:  Decreased endurance, Decreased range of motion, Decreased strength, Pain,  Decreased mobility, Postural dysfunction  Visit Diagnosis: Shoulder impingement syndrome, right  Rotator cuff dysfunction, right  Rotator cuff impingement syndrome of right shoulder     Problem List Patient Active Problem List   Diagnosis Date Noted  . Thrombocytopenia ([place]) 10/09/2018  . Renal cyst 10/09/2018  . Sleep apnea 09/26/2017  . Headache 09/26/2017  . Polyneuropathy 07/18/2017  . Abdominal pain 07/13/2017  . CTS (carpal tunnel syndrome) 02/05/2017  . Numbness in feet 10/27/2016  . Vitamin D deficiency 10/27/2016  . Health care maintenance 11/19/2015  . Acute bilateral low back pain without sciatica 07/18/2015  . Sinusitis, acute maxillary 07/18/2015  . History of hay fever 10/23/2014  . GERD (gastroesophageal reflux disease) 10/23/2014  . Cephalalgia 10/23/2014  . Dysmetabolic syndrome 123456  . Hyperglycemia 10/23/2014  . Hyperlipidemia 10/23/2014  . External thrombosed hemorrhoids 04/06/2008  . 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 Cannot sleep 04/19/2007  . Benign essential HTN 08/05/2006   Shelton Silvas PT, DPT Shelton Silvas 06/13/2019, 8:12 AM  [facility] [address] [place], [address] Phone: [phone]   Fax:  [phone]  Name: Clifford Alexander MRN: [id number] Date of Birth: 11/24/1965

## 2019-06-20 ENCOUNTER — Other Ambulatory Visit: Payer: Self-pay

## 2019-06-20 ENCOUNTER — Encounter: Payer: Self-pay | Admitting: Physical Therapy

## 2019-06-20 ENCOUNTER — Ambulatory Visit: Payer: Managed Care, Other (non HMO) | Admitting: Physical Therapy

## 2019-06-20 DIAGNOSIS — M67911 Unspecified disorder of synovium and tendon, right shoulder: Secondary | ICD-10-CM

## 2019-06-20 DIAGNOSIS — M7541 Impingement syndrome of right shoulder: Secondary | ICD-10-CM | POA: Diagnosis not present

## 2019-06-20 NOTE — Therapy (Signed)
[facility] [address] [place], [address] Phone: [phone]   Fax:  [phone]  Physical Therapy Treatment/DC Summary Reporting Period 04/25/19 - 06/20/19   Patient Details  Name: Clifford Alexander MRN: [id number] Date of Birth: [birth year] No data recorded  Encounter Date: 06/20/2019  PT End of Session - 06/20/19 0744    Visit Number  9    Number of Visits  17    Date for PT Re-Evaluation  06/20/19    PT Start Time  0731    PT Stop Time  0801    PT Time Calculation (min)  30 min    Activity Tolerance  Patient tolerated treatment well    Behavior During Therapy  [facility] for tasks assessed/performed       Past Medical History:  Diagnosis Date  . Bradycardia   . CPAP (continuous positive airway pressure) dependence   . Fatigue 06/23/2008  . Hyperlipidemia   . Hypertension   . Insomnia 04/19/2007  . Neuromuscular disorder (HCC)    neuropathy in feet  . Shingles 01/2016  . Sleep apnea   . Thrombocytopenia ([place])     Past Surgical History:  Procedure Laterality Date  . LUMBAR LAMINECTOMY  04/22/2007   L4-L5   discectomy 03/2008    There were no vitals filed for this visit.  Subjective Assessment - 06/20/19 0735    Subjective  Reports he moved over 400# of fertilizer this weekend and that his shoulder is sore from this, but reports no sharp pain or popping/clicking sensations    Pertinent History  Pt is a 54 year old male with C/C of R shoulder pain. Pt reports pain started in October of 2020 while he was working on his truck. He was on his back under the truck and as he reached up, he "felt like it overextended" and felt pain. Pain has been improving since oct and pt states he has seen a "50% improvement" in pain. Currently, pain is 0/10, but at its worst gets to 5-6/10. Pt describes pain as achy and dull that stays in his shoulder but will occasionally radiate down below the elbow. Aggravating factors  include reaching overhead or out to the side and resting arm on elbow pad while driving truck with immediate onset. Pt reports pain that wakes him up during the night, but able to fall back asleep with changing position. Easing factors include changing arm position, applying topical glutathione, and stretches for extension. 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 Pt enjoys riding his motorcycle, which he has been unable to do since his injury. Pt goals include reducing pain and increasing strength so he can sleep/drive without pain, work on his truck, and ride his motorcycle pain-free. Pt does not have a history of cancer, heart attack, seizure, or stroke. Pt denies numbness and tingling, sensation changes, bowel/bladder abnormalities, saddle paresthesia, fever/night sweats, or unexplained weight loss or weight gain.    Limitations  Sitting;House hold activities;Lifting;Other (comment)    How long can you sit comfortably?  depends    How long can you stand comfortably?  unlimited    How long can you walk comfortably?  unlimited    Patient Stated Goals  decrease pain, reach overhead, ride motorcycle, work on truck    Pain Onset  More than a month ago         [place] 80mn forward 362m back  MMT and ROM Assessment Access Code:  [email or web address]: PT educated patient on frequency, how/when to increase/decrease frequency, rep/set range for strengthening gains, and the importance of posture. Patine tis able to demonstrate the following therex with accuracy.  Eccentric Bicep Curl - 1 x daily - 3 x weekly - 3 sets - 10 reps  Standing High Row with Resistance - 1 x daily - 3 x weekly - 3 sets - 10 reps  Standing Shoulder Row with Anchored Resistance - 1 x daily - 3 x weekly - 3 sets - 10 reps  Prone Scapular Retraction Y - 1 x daily - 3 x weekly - 3 sets - 10 reps  Prone Scapular Retraction Arms at Side - 1 x daily - 3 x weekly - 3 sets - 10 reps  Prone Shoulder Extension - 1 x daily -  3 x weekly - 3 sets - 10 reps  Seated Scapular Retraction - 8 x daily - 7 x weekly - 10 reps - 3-5 hold                        PT Education - 06/20/19 0744    Education Details  therex form; HEP update, d/c recommendations    Person(s) Educated  Patient    Methods  Explanation;Demonstration;Tactile cues;Verbal cues;Handout    Comprehension  Verbalized understanding;Returned demonstration;Verbal cues required;Tactile cues required       PT Short Term Goals - 06/20/19 0736      PT SHORT TERM GOAL #1   Title  In 4 weeks, pt will decrease pain score by 2 points in order to show clinically significant improvement in pain.    Baseline  04/25/2019; Worst: 5-6/10; 06/20/19 5/10 but is far less frequent than before, very rare    Time  4    Period  Weeks    Status  Partially Met      PT SHORT TERM GOAL #2   Title  In 4 weeks, pt will be able to sleep through the night without shoulder pain.    Baseline  04/25/2019; pain wakes him up in the night; 06/20/19 Patient is sleeping through the night    Time  4    Period  Weeks    Status  Achieved        PT Long Term Goals - 06/20/19 0737      PT LONG TERM GOAL #1   Title  In 8 weeks, pt will improve ROM in R shoulder WNL without pain in order to improve functional mobility for ADLs and work.    Baseline  04/25/2019; AROM Flexion 150d and painful; ABD 130d painful; IR L1; 06/20/19 AROM Flexion 180d ; ABD 180d ; IR T8 ER C8    Time  8    Period  Weeks    Status  Achieved      PT LONG TERM GOAL #2   Title  In 8 weeks, pt will improve gross strength of periscapular musculature to 5/5 in order to maintain proper shoulder girdle posture and improve motor control.    Baseline  04/25/2019; Is (3/5; painful), T's (4-/5; painful), Y's (4+/5; painful); 06/20/19 ; Is 4+; T 5; Y 4+ none painful    Time  8    Period  Weeks    Status  Achieved      PT LONG TERM GOAL #3   Title  In 8 weeks, pt will decrease FOTO score to 73 in order  to demonstrate predicted functional level    Baseline  04/25/2019 60 06/20/19 84    Time  8    Period  Weeks            Plan - 06/20/19 0804    Clinical Impression Statement  PT reassessed goals this session, where patient has met all goals, except having 6/10 pain that is very infrequent. Patient reports his popping/clcking is very infrequent and goes away. Pt is able to complete all therex for HEP with proper technique of all therex following min cuing. Patient is able to verbalize understanding of all d/c recommendations to d/c PT to robust HEP.    Personal Factors and Comorbidities  Fitness;Profession;Time since onset of injury/illness/exacerbation;Comorbidity 3+    Comorbidities  Hyperlipidemia, hypertension, insomnia, thrombocytopenia    Examination-Activity Limitations  Reach Overhead;Hygiene/Grooming;Lift;Carry;Dressing    Examination-Participation Restrictions  Yard Work;Cleaning;Other    Stability/Clinical Decision Making  Evolving/Moderate complexity    Clinical Decision Making  Moderate    Rehab Potential  Good    PT Frequency  1x / week    PT Duration  8 weeks    PT Treatment/Interventions  ADLs/Self Care Home Management;Cryotherapy;Electrical Stimulation;Traction;Ultrasound;Functional mobility training;Therapeutic activities;Therapeutic exercise;Neuromuscular re-education;Manual techniques;Passive range of motion;Joint Manipulations;Spinal Manipulations;Dry needling;Moist Heat;Patient/family education;Iontophoresis 77m/ml Dexamethasone;DME Instruction    PT Next Visit Plan  parascapular strength    PT Home Exercise Plan  scapular retractions, pec door frame stretch, book openings, postural correction, standing towel IR/ER stretch    Consulted and Agree with Plan of Care  Patient       Patient will benefit from skilled therapeutic intervention in order to improve the following deficits and impairments:  Decreased endurance, Decreased range of motion, Decreased strength,  Pain, Decreased mobility, Postural dysfunction  Visit Diagnosis: Shoulder impingement syndrome, right  Rotator cuff dysfunction, right  Rotator cuff impingement syndrome of right shoulder     Problem List Patient Active Problem List   Diagnosis Date Noted  . Thrombocytopenia ([place] 10/09/2018  . Renal cyst 10/09/2018  . Sleep apnea 09/26/2017  . Headache 09/26/2017  . Polyneuropathy 07/18/2017  . Abdominal pain 07/13/2017  . CTS (carpal tunnel syndrome) 02/05/2017  . Numbness in feet 10/27/2016  . Vitamin D deficiency 10/27/2016  . Health care maintenance 11/19/2015  . Acute bilateral low back pain without sciatica 07/18/2015  . Sinusitis, acute maxillary 07/18/2015  . History of hay fever 10/23/2014  . GERD (gastroesophageal reflux disease) 10/23/2014  . Cephalalgia 10/23/2014  . Dysmetabolic syndrome 065/78/4696 . Hyperglycemia 10/23/2014  . Hyperlipidemia 10/23/2014  . External thrombosed hemorrhoids 04/06/2008  . 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 Cannot sleep 04/19/2007  . Benign essential HTN 08/05/2006   CShelton SilvasPT, DPT CShelton Silvas3/22/2021, 8:17 AM  [facility] [address] [place] [address]Phone: [phone]  Fax:  [phone] Name: MLOWERY PAULLINMRN: [id number] of Birth: [birth year]

## 2019-06-27 ENCOUNTER — Encounter: Payer: Managed Care, Other (non HMO) | Admitting: Internal Medicine

## 2019-06-27 ENCOUNTER — Ambulatory Visit: Payer: Managed Care, Other (non HMO) | Admitting: Physical Therapy

## 2019-07-31 ENCOUNTER — Other Ambulatory Visit: Payer: Self-pay | Admitting: Internal Medicine

## 2019-07-31 DIAGNOSIS — I10 Essential (primary) hypertension: Secondary | ICD-10-CM

## 2019-08-01 ENCOUNTER — Other Ambulatory Visit: Payer: Self-pay | Admitting: Internal Medicine

## 2019-08-01 DIAGNOSIS — I10 Essential (primary) hypertension: Secondary | ICD-10-CM

## 2019-08-15 ENCOUNTER — Inpatient Hospital Stay: Payer: Managed Care, Other (non HMO) | Attending: Oncology

## 2019-08-15 ENCOUNTER — Encounter: Payer: Self-pay | Admitting: Internal Medicine

## 2019-08-15 ENCOUNTER — Other Ambulatory Visit: Payer: Self-pay

## 2019-08-15 ENCOUNTER — Ambulatory Visit (INDEPENDENT_AMBULATORY_CARE_PROVIDER_SITE_OTHER): Payer: Managed Care, Other (non HMO) | Admitting: Internal Medicine

## 2019-08-15 VITALS — BP 138/78 | HR 71 | Temp 98.1°F | Resp 16 | Ht 76.0 in | Wt 256.0 lb

## 2019-08-15 DIAGNOSIS — G4733 Obstructive sleep apnea (adult) (pediatric): Secondary | ICD-10-CM

## 2019-08-15 DIAGNOSIS — D696 Thrombocytopenia, unspecified: Secondary | ICD-10-CM | POA: Diagnosis present

## 2019-08-15 DIAGNOSIS — Z0000 Encounter for general adult medical examination without abnormal findings: Secondary | ICD-10-CM

## 2019-08-15 DIAGNOSIS — E785 Hyperlipidemia, unspecified: Secondary | ICD-10-CM

## 2019-08-15 DIAGNOSIS — N281 Cyst of kidney, acquired: Secondary | ICD-10-CM | POA: Diagnosis not present

## 2019-08-15 DIAGNOSIS — G629 Polyneuropathy, unspecified: Secondary | ICD-10-CM

## 2019-08-15 DIAGNOSIS — K219 Gastro-esophageal reflux disease without esophagitis: Secondary | ICD-10-CM

## 2019-08-15 DIAGNOSIS — R739 Hyperglycemia, unspecified: Secondary | ICD-10-CM

## 2019-08-15 LAB — CBC WITH DIFFERENTIAL/PLATELET
Abs Immature Granulocytes: 0.01 10*3/uL (ref 0.00–0.07)
Basophils Absolute: 0 10*3/uL (ref 0.0–0.1)
Basophils Relative: 0 %
Eosinophils Absolute: 0.1 10*3/uL (ref 0.0–0.5)
Eosinophils Relative: 1 %
HCT: 41.9 % (ref 39.0–52.0)
Hemoglobin: 14.5 g/dL (ref 13.0–17.0)
Immature Granulocytes: 0 %
Lymphocytes Relative: 27 %
Lymphs Abs: 1.5 10*3/uL (ref 0.7–4.0)
MCH: 31.3 pg (ref 26.0–34.0)
MCHC: 34.6 g/dL (ref 30.0–36.0)
MCV: 90.5 fL (ref 80.0–100.0)
Monocytes Absolute: 0.4 10*3/uL (ref 0.1–1.0)
Monocytes Relative: 8 %
Neutro Abs: 3.4 10*3/uL (ref 1.7–7.7)
Neutrophils Relative %: 64 %
Platelets: 142 10*3/uL — ABNORMAL LOW (ref 150–400)
RBC: 4.63 MIL/uL (ref 4.22–5.81)
RDW: 12.6 % (ref 11.5–15.5)
WBC: 5.4 10*3/uL (ref 4.0–10.5)
nRBC: 0 % (ref 0.0–0.2)

## 2019-08-15 LAB — COMPREHENSIVE METABOLIC PANEL
ALT: 14 U/L (ref 0–44)
AST: 16 U/L (ref 15–41)
Albumin: 4.3 g/dL (ref 3.5–5.0)
Alkaline Phosphatase: 53 U/L (ref 38–126)
Anion gap: 8 (ref 5–15)
BUN: 17 mg/dL (ref 6–20)
CO2: 26 mmol/L (ref 22–32)
Calcium: 9.1 mg/dL (ref 8.9–10.3)
Chloride: 104 mmol/L (ref 98–111)
Creatinine, Ser: 1.22 mg/dL (ref 0.61–1.24)
GFR calc Af Amer: >60 mL/min (ref >60)
GFR calc non Af Amer: >60 mL/min (ref >60)
Glucose, Bld: 93 mg/dL (ref 70–99)
Potassium: 4.3 mmol/L (ref 3.5–5.1)
Sodium: 138 mmol/L (ref 135–145)
Total Bilirubin: 1.4 mg/dL — ABNORMAL HIGH (ref 0.3–1.2)
Total Protein: 6.9 g/dL (ref 6.5–8.1)

## 2019-08-15 NOTE — Progress Notes (Signed)
Patient ID: Clifford Alexander, male   DOB: 1965/10/28, 54 y.o.   MRN: [id number]   Subjective:    Patient ID: Clifford Alexander, male    DOB: [birth year], 54 y.o.   MRN: [id number]  HPI This visit occurred during the SARS-CoV-2 public health emergency.  Safety protocols were in place, including screening questions prior to the visit, additional usage of staff PPE, and extensive cleaning of exam room while observing appropriate contact time as indicated for disinfecting solutions.  Patient here for his physical exam.  He reports he is doing relatively well. Has been going to PT for his shoulder.  Shoulder is better.  Tries to stay active.  No chest pain or sob reported.  No abdominal pain or bowel change reported.  Handling stress.  Saw neurology - generalized sensorimotor polyneuropathy + bilateral CTS - recommended alpha lipoic acid.  Has mild to moderate obstructive sleep apnea - using cpap.  Blood pressure doing well.  Has seen urology - renal cyst.  Ultrasound (renal) - no acute findings.  Stable simple appearing right midpole renal cyst.  Continue to f/u with urology.    Past Medical History:  Diagnosis Date  . Bradycardia   . CPAP (continuous positive airway pressure) dependence   . Fatigue 06/23/2008  . Hyperlipidemia   . Hypertension   . Insomnia 04/19/2007  . Neuromuscular disorder (HCC)    neuropathy in feet  . Shingles 01/2016  . Sleep apnea   . Thrombocytopenia ([place])    Past Surgical History:  Procedure Laterality Date  . LUMBAR LAMINECTOMY  04/22/2007   L4-L5   discectomy 03/2008   Family History  Problem Relation Age of Onset  . Hyperthyroidism Mother   . Kidney disease Mother   . Hypertension Father   . Prostate cancer Paternal Grandfather    Social History   Socioeconomic History  . Marital status: Married    Spouse name: Not on file  . Number of children: Not on file  . Years of education: Not on file  . Highest education level: Not on file  Occupational History  .  Not on file  Tobacco Use  . Smoking status: Former Smoker    Packs/day: 1.00    Years: 10.00    Pack years: 10.00    Quit date: 04/18/1992    Years since quitting: 27.3  . Smokeless tobacco: Never Used  . Tobacco comment: quit in 1994  Substance and Sexual Activity  . Alcohol use: No    Alcohol/week: 0.0 standard drinks  . Drug use: No  . Sexual activity: Yes  Other Topics Concern  . Not on file  Social History Narrative  . Not on file   Social Determinants of Health   Financial Resource Strain:   . Difficulty of Paying Living Expenses:   Food Insecurity:   . Worried About Charity fundraiser in the Last Year:   . Arboriculturist in the Last Year:   Transportation Needs:   . Film/video editor (Medical):   Marland Kitchen Lack of Transportation (Non-Medical):   Physical Activity:   . Days of Exercise per Week:   . Minutes of Exercise per Session:   Stress:   . Feeling of Stress :   Social Connections:   . Frequency of Communication with Friends and Family:   . Frequency of Social Gatherings with Friends and Family:   . Attends Religious Services:   . Active Member of Clubs or Organizations:   . Attends  Club or Organization Meetings:   Marland Kitchen Marital Status:     Outpatient Encounter Medications as of 08/15/2019  Medication Sig  . Alpha-Lipoic Acid 200 MG CAPS Take 600 mg by mouth daily.  Marland Kitchen amLODipine (NORVASC) 5 MG tablet Take 1 tablet by mouth once daily  . azelastine (ASTELIN) 0.1 % nasal spray Place into the nose.  . b complex vitamins tablet Take 1 tablet by mouth daily.  . Cholecalciferol (VITAMIN D3) 125 MCG (5000 UT) CAPS Take 1 capsule by mouth daily.  Marland Kitchen co-enzyme Q-10 30 MG capsule Take 100 mg by mouth daily.   Marland Kitchen MAGNESIUM OXIDE PO Take 100 mg by mouth daily.   Marland Kitchen omega-3 acid ethyl esters (LOVAZA) 1 G capsule Take by mouth 2 (two) times daily.  . pantoprazole (PROTONIX) 40 MG tablet Take by mouth.  Marland Kitchen PREBIOTIC PRODUCT PO Take 1 Dose by mouth daily.   Marland Kitchen saccharomyces  boulardii (FLORASTOR) 250 MG capsule Take by mouth.  . tamsulosin (FLOMAX) 0.4 MG CAPS capsule Take 1 capsule (0.4 mg total) by mouth daily.  . Turmeric 500 MG CAPS Take 500 mg by mouth daily.   Marland Kitchen UNABLE TO FIND Take 1 tablet by mouth daily. Med Name: Betacysterol  . vitamin B-12 (CYANOCOBALAMIN) 100 MCG tablet Take 100 mcg by mouth daily.  . [DISCONTINUED] hyoscyamine (LEVSIN SL) 0.125 MG SL tablet Place under the tongue.   No facility-administered encounter medications on file as of 08/15/2019.   Review of Systems  Constitutional: Negative for appetite change and unexpected weight change.  HENT: Negative for congestion and sinus pressure.   Eyes: Negative for pain and visual disturbance.  Respiratory: Negative for cough, chest tightness and shortness of breath.   Cardiovascular: Negative for chest pain, palpitations and leg swelling.  Gastrointestinal: Negative for abdominal pain, diarrhea and nausea.  Genitourinary: Negative for difficulty urinating and dysuria.  Musculoskeletal: Negative for joint swelling and myalgias.  Skin: Negative for rash and wound.  Neurological: Negative for dizziness, light-headedness and headaches.  Hematological: Negative for adenopathy. Does not bruise/bleed easily.  Psychiatric/Behavioral: Negative for agitation and dysphoric mood.       Objective:    Physical Exam Constitutional:      General: He is not in acute distress.    Appearance: Normal appearance. He is well-developed.  HENT:     Head: Normocephalic and atraumatic.     Right Ear: External ear normal.     Left Ear: External ear normal.  Eyes:     General:        Right eye: No discharge.        Left eye: No discharge.     Conjunctiva/sclera: Conjunctivae normal.  Neck:     Thyroid: No thyromegaly.  Cardiovascular:     Rate and Rhythm: Normal rate and regular rhythm.  Pulmonary:     Effort: No respiratory distress.     Breath sounds: Normal breath sounds. No wheezing.  Abdominal:       General: Bowel sounds are normal.     Palpations: Abdomen is soft.     Tenderness: There is no abdominal tenderness.  Genitourinary:    Comments: Not performed.   Musculoskeletal:        General: No swelling or tenderness.     Cervical back: Neck supple. No tenderness.  Lymphadenopathy:     Cervical: No cervical adenopathy.  Skin:    Findings: No erythema or rash.  Neurological:     Mental Status: He is alert and oriented to  person, place, and time.  Psychiatric:        Mood and Affect: Mood normal.        Behavior: Behavior normal.     BP 138/78   Pulse 71   Temp 98.1 F (36.7 C)   Resp 16   Ht 6' 4"  (1.93 m)   Wt 256 lb (116.1 kg)   SpO2 98%   BMI 31.16 kg/m  Wt Readings from Last 3 Encounters:  08/15/19 256 lb (116.1 kg)  02/14/19 258 lb (117 kg)  02/14/19 258 lb 3.2 oz (117.1 kg)     Lab Results  Component Value Date   WBC 5.4 08/15/2019   HGB 14.5 08/15/2019   HCT 41.9 08/15/2019   PLT 142 (L) 08/15/2019   GLUCOSE 93 08/15/2019   CHOL 182 10/25/2018   TRIG 83.0 10/25/2018   HDL 41.20 10/25/2018   LDLCALC 124 (H) 10/25/2018   ALT 14 08/15/2019   AST 16 08/15/2019   NA 138 08/15/2019   K 4.3 08/15/2019   CL 104 08/15/2019   CREATININE 1.22 08/15/2019   BUN 17 08/15/2019   CO2 26 08/15/2019   TSH 1.57 10/26/2017   PSA 0.55 10/25/2018   HGBA1C 5.5 10/25/2018    Ultrasound renal complete  Result Date: 01/31/2019 CLINICAL DATA:  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 EXAM: RENAL / URINARY TRACT ULTRASOUND COMPLETE COMPARISON:  CTs, 09/27/2018 and 07/27/2017. FINDINGS: Right Kidney: Renal measurements: 13.9 x 5.2 x 5.5 cm = volume: 209 mL. Normal parenchymal echogenicity. Exophytic midpole simple appearing 2 cm cyst stable from the prior CTs. No other renal masses, no stones and no hydronephrosis. Left Kidney: Renal measurements: 13.9 x 5.3 x 5.8 cm = volume: 227 mL. Normal parenchymal echogenicity. No mass, stone or hydronephrosis. 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 It is also not evident on the CT from 09/27/2018. Bladder: Appears normal for degree of bladder distention. Other: None. IMPRESSION: 1. No acute findings.  No hydronephrosis. 2. Stable simple appearing right midpole exophytic renal cyst. No other right renal abnormality. 3. No sonographic evidence of a left renal angiomyolipoma. Normal appearance of the left kidney. Electronically Signed   By: Lajean Manes M.D.   On: 01/31/2019 14:37       Assessment & Plan:   Problem List Items Addressed This Visit    GERD (gastroesophageal reflux disease)    Controlled on protonix.        Health care maintenance    Physical today 08/15/19.  Colonoscopy 09/2017.  Per pt, recommended f/u in 10 years.  Check psa.       Hyperglycemia    Low carb diet and exercise.  Follow met b and a1c.        Relevant Orders   Hemoglobin H0Q   Basic metabolic panel   Hyperlipidemia    Low cholesterol diet and exercise.  Follow lipid panel.        Relevant Orders   Hepatic function panel   Lipid panel   TSH   OSA (obstructive sleep apnea)    Continue cpap.        Polyneuropathy    Worked up by neurology.  Follow.        Renal cyst    Had f/u with urology.  CT and renal ulraosound - no visualization of angiomyolipoma.  Recommended f/u renal ultrasound in one year.        Thrombocytopenia ([place])  Has been followed by hematology. Last platelet count - 140s.  Follow.        Relevant Orders   CBC with Differential/Platelet    Other Visit Diagnoses    Routine general medical examination at a health care facility    -  Primary       Einar Pheasant, MD

## 2019-08-15 NOTE — Assessment & Plan Note (Addendum)
Physical today 08/15/19.  Colonoscopy 09/2017.  Per pt, recommended f/u in 10 years.  Check psa.

## 2019-08-28 ENCOUNTER — Encounter: Payer: Self-pay | Admitting: Internal Medicine

## 2019-08-28 NOTE — Assessment & Plan Note (Signed)
Controlled on protonix.   

## 2019-08-28 NOTE — Assessment & Plan Note (Signed)
Low cholesterol diet and exercise.  Follow lipid panel.   

## 2019-08-28 NOTE — Assessment & Plan Note (Signed)
Had f/u with urology.  CT and renal ulraosound - no visualization of angiomyolipoma.  Recommended f/u renal ultrasound in one year.

## 2019-08-28 NOTE — Assessment & Plan Note (Signed)
Continue cpap.  

## 2019-08-28 NOTE — Assessment & Plan Note (Signed)
Low carb diet and exercise.  Follow met b and a1c.   

## 2019-08-28 NOTE — Assessment & Plan Note (Signed)
Has been followed by hematology. Last platelet count - 140s.  Follow.

## 2019-08-28 NOTE — Assessment & Plan Note (Signed)
Worked up by neurology.  Follow.

## 2019-09-06 ENCOUNTER — Telehealth: Payer: Self-pay | Admitting: Internal Medicine

## 2019-09-06 NOTE — Telephone Encounter (Signed)
Patient is driving back from [facility] at the moment. He stated he will make his final decision to stop at ED when he reaches [place].  [name] - [facility] TELEPHONE ADVICE RECORD AccessNurse Patient Name: Clifford Alexander Gender: Male DOB: 09/25/1965 Age: 54 Y 44 M 21 D Return Phone Number: [phone] (Primary), [phone] (Secondary) Address: City/State/Zip: [name] [address] Client [facility] [place] [facility] - [place] Client Site [place] - Day Physician Einar Pheasant - MD Contact Type Call Who Is Calling Patient / Member / Family / Caregiver Call Type Triage / Clinical Relationship To Patient Self Return Phone Number [phone] (Primary) Chief Complaint CHEST PAIN (>=21 years) - pain, pressure, heaviness or tightness Reason for Call Symptomatic / Request for [place] states he is having right shoulder pain and chest pain. Translation No Nurse Assessment Nurse: Rufina Falco, RN, Deb Date/Time (Eastern Time): 09/06/2019 8:29:55 AM Confirm and document reason for call. If symptomatic, describe symptoms. ---Caller states he is having right shoulder pain that increases with each breathe and chest pain Has the patient had close contact with a person known or suspected to have the novel coronavirus illness OR traveled / lives in area with major community spread (including international travel) in the last 14 days from the onset of symptoms? * If Asymptomatic, screen for exposure and travel within the last 14 days. ---No Does the patient have any new or worsening symptoms? ---Yes Will a triage be completed? ---Yes Related visit to physician within the last 2 weeks? ---No Does the PT have any chronic conditions? (i.e. diabetes, asthma, this includes High risk factors for pregnancy, etc.) ---Yes List chronic conditions. ---HTN, prostate issues, GERD, Is this a behavioral health or  substance abuse call? ---No Guidelines Guideline Title Affirmed Question Affirmed Notes Nurse Date/Time (Eastern Time) Shoulder Pain [1] Age > 40 AND [2] no obvious cause AND [3] pain even when not moving the arm (Exception: pain is clearly Rufina Falco, Therapist, sports, Deb 09/06/2019 8:33:11 AMPLEASE NOTE: All timestamps contained within this report are represented as [place] Standard Time. CONFIDENTIALTY NOTICE: This fax transmission is intended only for the addressee. It contains information that is legally privileged, confidential or otherwise protected from use or disclosure. If you are not the intended recipient, you are strictly prohibited from reviewing, disclosing, copying using or disseminating any of this information or taking any action in reliance on or regarding this information. If you have received this fax in error, please notify us immediately by telephone so that we can arrange for its return to [place]. Phone: [phone], Toll-Free: [phone], Fax: [phone] Page: 2 of 2 Call Id: [id number] Guidelines Guideline Title Affirmed Question Affirmed Notes Nurse Date/Time Eilene Ghazi Time) made worse by moving arm or bending neck) Disp. Time Eilene Ghazi Time) Disposition Final User 09/06/2019 8:28:29 AM Send to Urgent Queue Idolina Primer 09/06/2019 8:36:01 AM Go to ED Now Yes Rufina Falco, RN, Deb Caller Disagree/Comply Comply Caller Understands Yes PreDisposition InappropriateToAsk Care Advice Given Per Guideline GO TO ED NOW: * You need to be seen in the Emergency Department. * Go to the ED at ___________ [place] now. Drive carefully. CARE ADVICE given per Shoulder Pain (Adult) guideline NOTE TO TRIAGER - DRIVING: * Another adult should drive. * If immediate transportation is not available via car or taxi, then the patient should be instructed to call [id number]. Referrals GO TO FACILITY UNDECIDED

## 2019-09-06 NOTE — Telephone Encounter (Signed)
Pt reports right shoulder and chest pain since Sunday. It is painful to breathe in. No other symptoms reported by pt. Pt went to chiropractor and it did not help. Transferred to [facility] at [facility]

## 2019-09-06 NOTE — Telephone Encounter (Signed)
Patient stated is not going to the ED. He was having shoulder pain. Has gotten better now. He was working on his bike this weekend and thinks he overdid it. He Confirmed no acute symptoms at this time. Stated he was not having chest pain or SOB. Patient will go to ED or acute care if any acute symptoms.

## 2019-09-06 NOTE — Telephone Encounter (Signed)
If chest pain and difficulty breathing, agree with ER evaluation.  Needs to go now and be seen

## 2019-09-06 NOTE — Telephone Encounter (Signed)
LMTCB

## 2019-09-19 ENCOUNTER — Other Ambulatory Visit (INDEPENDENT_AMBULATORY_CARE_PROVIDER_SITE_OTHER): Payer: Managed Care, Other (non HMO)

## 2019-09-19 ENCOUNTER — Other Ambulatory Visit: Payer: Self-pay

## 2019-09-19 DIAGNOSIS — D696 Thrombocytopenia, unspecified: Secondary | ICD-10-CM | POA: Diagnosis not present

## 2019-09-19 DIAGNOSIS — R739 Hyperglycemia, unspecified: Secondary | ICD-10-CM | POA: Diagnosis not present

## 2019-09-19 DIAGNOSIS — E785 Hyperlipidemia, unspecified: Secondary | ICD-10-CM | POA: Diagnosis not present

## 2019-09-19 LAB — CBC WITH DIFFERENTIAL/PLATELET
Basophils Absolute: 0 10*3/uL (ref 0.0–0.1)
Basophils Relative: 0.4 % (ref 0.0–3.0)
Eosinophils Absolute: 0 10*3/uL (ref 0.0–0.7)
Eosinophils Relative: 1 % (ref 0.0–5.0)
HCT: 45.6 % (ref 39.0–52.0)
Hemoglobin: 15.4 g/dL (ref 13.0–17.0)
Lymphocytes Relative: 33.6 % (ref 12.0–46.0)
Lymphs Abs: 1.4 10*3/uL (ref 0.7–4.0)
MCHC: 33.8 g/dL (ref 30.0–36.0)
MCV: 92.6 fl (ref 78.0–100.0)
Monocytes Absolute: 0.4 10*3/uL (ref 0.1–1.0)
Monocytes Relative: 8.6 % (ref 3.0–12.0)
Neutro Abs: 2.4 10*3/uL (ref 1.4–7.7)
Neutrophils Relative %: 56.4 % (ref 43.0–77.0)
Platelets: 145 10*3/uL — ABNORMAL LOW (ref 150.0–400.0)
RBC: 4.93 Mil/uL (ref 4.22–5.81)
RDW: 13.1 % (ref 11.5–15.5)
WBC: 4.2 10*3/uL (ref 4.0–10.5)

## 2019-09-19 LAB — TSH: TSH: 2.04 u[IU]/mL (ref 0.35–4.50)

## 2019-09-19 LAB — LIPID PANEL
Cholesterol: 183 mg/dL (ref 0–200)
HDL: 48.1 mg/dL (ref >39.00)
LDL Cholesterol: 124 mg/dL — ABNORMAL HIGH (ref 0–99)
NonHDL: 134.63
Total CHOL/HDL Ratio: 4
Triglycerides: 55 mg/dL (ref 0.0–149.0)
VLDL: 11 mg/dL (ref 0.0–40.0)

## 2019-09-19 LAB — BASIC METABOLIC PANEL
BUN: 15 mg/dL (ref 6–23)
CO2: 28 mEq/L (ref 19–32)
Calcium: 9.5 mg/dL (ref 8.4–10.5)
Chloride: 105 mEq/L (ref 96–112)
Creatinine, Ser: 1.23 mg/dL (ref 0.40–1.50)
GFR: 60.92 mL/min (ref >60.00)
Glucose, Bld: 108 mg/dL — ABNORMAL HIGH (ref 70–99)
Potassium: 4.3 mEq/L (ref 3.5–5.1)
Sodium: 140 mEq/L (ref 135–145)

## 2019-09-19 LAB — HEPATIC FUNCTION PANEL
ALT: 13 U/L (ref 0–53)
AST: 14 U/L (ref 0–37)
Albumin: 4.6 g/dL (ref 3.5–5.2)
Alkaline Phosphatase: 56 U/L (ref 39–117)
Bilirubin, Direct: 0.2 mg/dL (ref 0.0–0.3)
Total Bilirubin: 0.7 mg/dL (ref 0.2–1.2)
Total Protein: 6.6 g/dL (ref 6.0–8.3)

## 2019-09-19 LAB — HEMOGLOBIN A1C: Hgb A1c MFr Bld: 5.4 % (ref 4.6–6.5)

## 2019-11-14 ENCOUNTER — Encounter: Payer: Self-pay | Admitting: Urology

## 2020-01-09 ENCOUNTER — Ambulatory Visit
Admission: RE | Admit: 2020-01-09 | Discharge: 2020-01-09 | Disposition: A | Discharge status: Home / Self Care | Payer: Managed Care, Other (non HMO) | Source: Ambulatory Visit | Attending: Urology | Admitting: Urology

## 2020-01-09 ENCOUNTER — Other Ambulatory Visit: Payer: Self-pay

## 2020-01-09 DIAGNOSIS — D1771 Benign lipomatous neoplasm of kidney: Secondary | ICD-10-CM | POA: Diagnosis not present

## 2020-01-30 ENCOUNTER — Other Ambulatory Visit: Payer: Self-pay | Admitting: Internal Medicine

## 2020-01-30 DIAGNOSIS — I10 Essential (primary) hypertension: Secondary | ICD-10-CM

## 2020-02-06 ENCOUNTER — Encounter: Payer: Self-pay | Admitting: Urology

## 2020-02-06 ENCOUNTER — Ambulatory Visit (INDEPENDENT_AMBULATORY_CARE_PROVIDER_SITE_OTHER): Payer: Managed Care, Other (non HMO) | Admitting: Urology

## 2020-02-06 ENCOUNTER — Other Ambulatory Visit: Payer: Self-pay

## 2020-02-06 VITALS — BP 149/90 | HR 65 | Ht 76.0 in | Wt 255.0 lb

## 2020-02-06 DIAGNOSIS — N138 Other obstructive and reflux uropathy: Secondary | ICD-10-CM

## 2020-02-06 DIAGNOSIS — D1771 Benign lipomatous neoplasm of kidney: Secondary | ICD-10-CM | POA: Diagnosis not present

## 2020-02-06 DIAGNOSIS — Z125 Encounter for screening for malignant neoplasm of prostate: Secondary | ICD-10-CM

## 2020-02-06 DIAGNOSIS — N401 Enlarged prostate with lower urinary tract symptoms: Secondary | ICD-10-CM

## 2020-02-06 MED ORDER — TAMSULOSIN HCL 0.4 MG PO CAPS: 0.4000 mg | ORAL_CAPSULE | Freq: Every day | ORAL | 11 refills | Status: DC

## 2020-02-06 NOTE — Progress Notes (Signed)
   02/06/2020 9:33 AM   Clifford Alexander April 04, 1965 [id number]  Reason for visit: Follow up AML, BPH  HPI: I saw Clifford Alexander in urology clinic for the above issues.  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  We followed him with serial ultrasounds that have shown no evidence of the previously seen AML, and it was also not seen on a CT with contrast in June 2020.  We discussed that this may have been an over read from the CT from 2019, versus spontaneous regression of a very small AML.  I personally reviewed his most recent renal ultrasound dated 01/09/2020 that shows no hydronephrosis, stable right simple cyst, no evidence of renal angiomyolipoma. I do not think he requires further renal imaging at this time.  Regarding his urinary symptoms, he previously had complaints of weak stream and nocturia and he started Flomax previously.  He has noticed significant improvement on this medication.  He also is compliant with CPAP machine for sleep apnea which has completely resolved his nocturia.  He is currently satisfied with his urinary symptoms.  We discussed other options at length including outlet procedures like UroLift, and he would like to continue Flomax at this time.  He is been getting PSA screening with his PCP, and last PSA was well within the normal range at 0.55 in July 2020.  RTC 1 year with IPSS/PVR for BPH Continue PSA screening with PCP   Billey Co, MD  [facility] [address], [place], [place] [address] [phone]

## 2020-02-06 NOTE — Patient Instructions (Signed)

## 2020-02-13 ENCOUNTER — Inpatient Hospital Stay: Payer: Managed Care, Other (non HMO) | Attending: Oncology | Admitting: Oncology

## 2020-02-13 ENCOUNTER — Other Ambulatory Visit: Payer: Self-pay

## 2020-02-13 ENCOUNTER — Ambulatory Visit: Payer: Managed Care, Other (non HMO) | Admitting: Urology

## 2020-02-13 ENCOUNTER — Encounter: Payer: Self-pay | Admitting: Oncology

## 2020-02-13 ENCOUNTER — Inpatient Hospital Stay: Payer: Managed Care, Other (non HMO)

## 2020-02-13 VITALS — BP 142/88 | HR 74 | Temp 98.4°F | Resp 18 | Wt 257.6 lb

## 2020-02-13 DIAGNOSIS — Z87891 Personal history of nicotine dependence: Secondary | ICD-10-CM | POA: Diagnosis not present

## 2020-02-13 DIAGNOSIS — D696 Thrombocytopenia, unspecified: Secondary | ICD-10-CM

## 2020-02-13 DIAGNOSIS — M5459 Other low back pain: Secondary | ICD-10-CM | POA: Diagnosis not present

## 2020-02-13 DIAGNOSIS — G8929 Other chronic pain: Secondary | ICD-10-CM | POA: Insufficient documentation

## 2020-02-13 LAB — CBC WITH DIFFERENTIAL/PLATELET
Abs Immature Granulocytes: 0.01 10*3/uL (ref 0.00–0.07)
Basophils Absolute: 0 10*3/uL (ref 0.0–0.1)
Basophils Relative: 1 %
Eosinophils Absolute: 0.1 10*3/uL (ref 0.0–0.5)
Eosinophils Relative: 1 %
HCT: 43.4 % (ref 39.0–52.0)
Hemoglobin: 15.2 g/dL (ref 13.0–17.0)
Immature Granulocytes: 0 %
Lymphocytes Relative: 34 %
Lymphs Abs: 1.4 10*3/uL (ref 0.7–4.0)
MCH: 31.5 pg (ref 26.0–34.0)
MCHC: 35 g/dL (ref 30.0–36.0)
MCV: 90 fL (ref 80.0–100.0)
Monocytes Absolute: 0.5 10*3/uL (ref 0.1–1.0)
Monocytes Relative: 11 %
Neutro Abs: 2.2 10*3/uL (ref 1.7–7.7)
Neutrophils Relative %: 53 %
Platelets: 141 10*3/uL — ABNORMAL LOW (ref 150–400)
RBC: 4.82 MIL/uL (ref 4.22–5.81)
RDW: 12.5 % (ref 11.5–15.5)
WBC: 4.2 10*3/uL (ref 4.0–10.5)
nRBC: 0 % (ref 0.0–0.2)

## 2020-02-13 NOTE — Progress Notes (Signed)
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 Of note patient has had mild thrombocytopenia dating back to April 2019 and his platelet counts fluctuate between 130s to 140s. His hemoglobin and white count have always been normal. Patient denies any history of bleeding or bruising. He is currently on B12 supplements. He is a Administrator by profession. His appetite and weight have been stable.Denies any joint pain or skin rash.   Interval history-patient has chronic low back pain and has undergone prior surgeries.  Denies any new complaints at this time.  Denies any bleeding or bruising  ECOG PS- 0 Pain scale- 3   Review of systems- Review of Systems  Constitutional: Negative for chills, fever, malaise/fatigue and weight loss.  HENT: Negative for congestion, ear discharge and nosebleeds.   Eyes: Negative for blurred vision.  Respiratory: Negative for cough, hemoptysis, sputum production, shortness of breath and wheezing.   Cardiovascular: Negative for chest pain, palpitations, orthopnea and claudication.  Gastrointestinal: Negative for abdominal pain, blood in stool, constipation, diarrhea, heartburn, melena, nausea and vomiting.  Genitourinary: Negative for dysuria, flank pain, frequency, hematuria and  urgency.  Musculoskeletal: Positive for back pain. Negative for joint pain and myalgias.  Skin: Negative for rash.  Neurological: Negative for dizziness, tingling, focal weakness, seizures, weakness and headaches.  Endo/Heme/Allergies: Does not bruise/bleed easily.  Psychiatric/Behavioral: Negative for depression and suicidal ideas. The patient does not have insomnia.       Allergies  Allergen Reactions  . Naproxen Shortness Of Breath  . Ciprofloxacin   . Ibuprofen      Past Medical History:  Diagnosis Date  . Bradycardia   . CPAP (continuous positive airway pressure) dependence   . Fatigue 06/23/2008  . Hyperlipidemia   . Hypertension   . Insomnia 04/19/2007  . Neuromuscular disorder (HCC)    neuropathy in feet  . Shingles 01/2016  . Sleep apnea   . Thrombocytopenia ([place])      Past Surgical History:  Procedure Laterality Date  . LUMBAR LAMINECTOMY  04/22/2007   L4-L5   discectomy 03/2008    Social History   Socioeconomic History  . Marital status: Married    Spouse name: Not on file  . Number of children: Not on file  . Years of education: Not on file  . Highest education level: Not on file  Occupational History  . Not on file  Tobacco Use  . Smoking status: Former Smoker    Packs/day: 1.00    Years: 10.00    Pack years: 10.00    Quit date: 04/18/1992    Years since quitting: 27.8  . Smokeless tobacco: Never Used  . Tobacco comment: quit in 1994  Vaping Use  . Vaping Use: Never used  Substance and Sexual Activity  . Alcohol use: No    Alcohol/week: 0.0 standard drinks  . Drug use: No  . Sexual activity: Yes  Other Topics Concern  . Not on file  Social History Narrative  . Not on file   Social Determinants of Health   Financial Resource Strain:   . Difficulty of Paying Living Expenses: Not on file  Food Insecurity:   . Worried About Charity fundraiser in the Last Year: Not on file  . Ran Out of Food in the Last Year: Not on file    Transportation Needs:   . Lack of Transportation (Medical): Not on file  . Lack of Transportation (Non-Medical): Not on file  Physical Activity:   . Days of Exercise per Week: Not on file  . Minutes of Exercise per Session: Not on file  Stress:   . Feeling of Stress : Not on file  Social Connections:   . Frequency of Communication with Friends and Family: Not on file  . Frequency of Social Gatherings with Friends and Family: Not on file  . Attends Religious Services: Not on file  . Active Member of Clubs or Organizations: Not on file  . Attends Archivist Meetings: Not on file  . Marital Status: Not on file  Intimate Partner Violence:   . Fear of Current or Ex-Partner: Not on file  . Emotionally Abused: Not on file  . Physically Abused: Not on file  . Sexually Abused: Not on file    Family History  Problem Relation Age of Onset  . Hyperthyroidism Mother   . Kidney disease Mother   . Hypertension Father   . Prostate cancer Paternal Grandfather      Current Outpatient Medications:  .  Alpha-Lipoic Acid 200 MG CAPS, Take 600 mg by mouth daily., Disp: , Rfl:  .  amLODipine (NORVASC) 5 MG tablet, Take 1 tablet by mouth once daily, Disp: 90 tablet, Rfl: 0 .  b complex vitamins tablet, Take 1 tablet by mouth daily., Disp: , Rfl:  .  Cholecalciferol (VITAMIN D3) 125 MCG (5000 UT) CAPS, Take 1 capsule by mouth daily., Disp: , Rfl:  .  co-enzyme Q-10 30 MG capsule, Take 100 mg by mouth daily. , Disp: , Rfl:  .  omega-3 acid ethyl esters (LOVAZA) 1 G capsule, Take by mouth 2 (two) times daily., Disp: , Rfl:  .  pantoprazole (PROTONIX) 40 MG tablet, Take by mouth., Disp: , Rfl:  .  Probiotic Product (PROBIOTIC PO), Take by mouth daily., Disp: , Rfl:  .  tamsulosin (FLOMAX) 0.4 MG CAPS capsule, Take 1 capsule (0.4 mg total) by mouth daily., Disp: 30 capsule, Rfl: 11 .  Turmeric 500 MG CAPS, Take 500 mg by mouth daily. , Disp: , Rfl:  .  vitamin B-12 (CYANOCOBALAMIN) 100 MCG  tablet, Take 100 mcg by mouth daily., Disp: , Rfl:  .  MAGNESIUM OXIDE PO, Take 100 mg by mouth daily.  (Patient not taking: Reported on 02/13/2020), Disp: , Rfl:   Physical exam:  Vitals:   02/13/20 1034  BP: (!) 142/88  Pulse: 74  Resp: 18  Temp: 98.4 F (36.9 C)  TempSrc: Tympanic  SpO2: 99%  Weight: 257 lb 9.6 oz (116.8 kg)   Physical Exam Constitutional:      General: He is not in acute distress. Cardiovascular:     Rate and Rhythm: Normal rate and regular rhythm.     Heart sounds: Normal heart sounds.  Pulmonary:     Effort: Pulmonary effort is normal.     Breath sounds: Normal breath sounds.  Abdominal:     General: Bowel sounds  are normal.     Palpations: Abdomen is soft.  Skin:    General: Skin is warm and dry.  Neurological:     Mental Status: He is alert and oriented to person, place, and time.      CMP Latest Ref Rng & Units 09/19/2019  Glucose 70 - 99 mg/dL 108(H)  BUN 6 - 23 mg/dL 15  Creatinine 0.40 - 1.50 mg/dL 1.23  Sodium 135 - 145 mEq/L 140  Potassium 3.5 - 5.1 mEq/L 4.3  Chloride 96 - 112 mEq/L 105  CO2 19 - 32 mEq/L 28  Calcium 8.4 - 10.5 mg/dL 9.5  Total Protein 6.0 - 8.3 g/dL 6.6  Total Bilirubin 0.2 - 1.2 mg/dL 0.7  Alkaline Phos 39 - 117 U/L 56  AST 0 - 37 U/L 14  ALT 0 - 53 U/L 13   CBC Latest Ref Rng & Units 02/13/2020  WBC 4.0 - 10.5 K/uL 4.2  Hemoglobin 13.0 - 17.0 g/dL 15.2  Hematocrit 39 - 52 % 43.4  Platelets 150 - 400 K/uL 141(L)     Assessment and plan- Patient is a 54 y.o. 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  No evidence of anemia or leukopenia.  Given the stability of counts he does not need to follow-up with hematology at this time and can continue to follow-up with Dr. Nicki Reaper.  He can get his CBC checked once a year and can be referred to [place] in the future if  questions or concerns arise   Visit Diagnosis 1. Thrombocytopenia ([place])      Dr. Randa Evens, MD, MPH [facility] [phone] 02/13/2020 10:40 AM

## 2020-02-15 ENCOUNTER — Ambulatory Visit: Payer: Managed Care, Other (non HMO) | Admitting: Urology

## 2020-02-16 ENCOUNTER — Other Ambulatory Visit: Payer: Managed Care, Other (non HMO)

## 2020-02-16 ENCOUNTER — Ambulatory Visit: Payer: Managed Care, Other (non HMO) | Admitting: Oncology

## 2020-02-20 ENCOUNTER — Other Ambulatory Visit: Payer: Self-pay

## 2020-02-20 ENCOUNTER — Ambulatory Visit: Payer: Managed Care, Other (non HMO) | Admitting: Internal Medicine

## 2020-02-20 ENCOUNTER — Ambulatory Visit: Payer: Managed Care, Other (non HMO) | Admitting: Urology

## 2020-02-20 VITALS — BP 122/72 | HR 75 | Temp 98.4°F | Resp 16 | Ht 76.0 in | Wt 254.0 lb

## 2020-02-20 DIAGNOSIS — K219 Gastro-esophageal reflux disease without esophagitis: Secondary | ICD-10-CM

## 2020-02-20 DIAGNOSIS — I10 Essential (primary) hypertension: Secondary | ICD-10-CM | POA: Diagnosis not present

## 2020-02-20 DIAGNOSIS — E785 Hyperlipidemia, unspecified: Secondary | ICD-10-CM

## 2020-02-20 DIAGNOSIS — M545 Low back pain, unspecified: Secondary | ICD-10-CM | POA: Diagnosis not present

## 2020-02-20 DIAGNOSIS — R1031 Right lower quadrant pain: Secondary | ICD-10-CM

## 2020-02-20 DIAGNOSIS — N281 Cyst of kidney, acquired: Secondary | ICD-10-CM

## 2020-02-20 DIAGNOSIS — R739 Hyperglycemia, unspecified: Secondary | ICD-10-CM

## 2020-02-20 DIAGNOSIS — E559 Vitamin D deficiency, unspecified: Secondary | ICD-10-CM

## 2020-02-20 DIAGNOSIS — D696 Thrombocytopenia, unspecified: Secondary | ICD-10-CM

## 2020-02-20 DIAGNOSIS — K4090 Unilateral inguinal hernia, without obstruction or gangrene, not specified as recurrent: Secondary | ICD-10-CM

## 2020-02-20 DIAGNOSIS — G4733 Obstructive sleep apnea (adult) (pediatric): Secondary | ICD-10-CM

## 2020-02-20 LAB — HEPATIC FUNCTION PANEL
ALT: 12 U/L (ref 0–53)
AST: 15 U/L (ref 0–37)
Albumin: 4.4 g/dL (ref 3.5–5.2)
Alkaline Phosphatase: 55 U/L (ref 39–117)
Bilirubin, Direct: 0.2 mg/dL (ref 0.0–0.3)
Total Bilirubin: 1.2 mg/dL (ref 0.2–1.2)
Total Protein: 6.4 g/dL (ref 6.0–8.3)

## 2020-02-20 LAB — LIPID PANEL
Cholesterol: 184 mg/dL (ref 0–200)
HDL: 47.4 mg/dL (ref >39.00)
LDL Cholesterol: 124 mg/dL — ABNORMAL HIGH (ref 0–99)
NonHDL: 136.94
Total CHOL/HDL Ratio: 4
Triglycerides: 65 mg/dL (ref 0.0–149.0)
VLDL: 13 mg/dL (ref 0.0–40.0)

## 2020-02-20 LAB — BASIC METABOLIC PANEL
BUN: 13 mg/dL (ref 6–23)
CO2: 31 mEq/L (ref 19–32)
Calcium: 9.4 mg/dL (ref 8.4–10.5)
Chloride: 106 mEq/L (ref 96–112)
Creatinine, Ser: 1.28 mg/dL (ref 0.40–1.50)
GFR: 62.71 mL/min (ref >60.00)
Glucose, Bld: 93 mg/dL (ref 70–99)
Potassium: 4.8 mEq/L (ref 3.5–5.1)
Sodium: 141 mEq/L (ref 135–145)

## 2020-02-20 NOTE — Progress Notes (Signed)
Patient ID: CHRSITOPHER WIK, male   DOB: 10-20-1965, 54 y.o.   MRN: [id number]   Subjective:    Patient ID: TORRES HARDENBROOK, male    DOB: Sep 01, 1965, 54 y.o.   MRN: [id number]  HPI This visit occurred during the SARS-CoV-2 public health emergency.  Safety protocols were in place, including screening questions prior to the visit, additional usage of staff PPE, and extensive cleaning of exam room while observing appropriate contact time as indicated for disinfecting solutions.  Patient here for a scheduled follow up.  He has been followed by hematology for mild thrombocytopenia.  hgb and white blood cell count have been normal.  Released.  Recommended for [place] to follow yearly. Reports starting two weeks ago, low back pain and pain to right knee.  Also right rght LQ and right hip discomfort.   Walking 2-3x/week.  No chest pain or sob reported.  Bowels moving.  Seeing urology.  flomax  - helped.  Using cpap.  Nocturia resolved.  Evaluated by GI 10/2019 - for f/u of chronic RLQ abdominal pain, GERD and constipation.  Recommended protonix and benefiber.     Past Medical History:  Diagnosis Date  . Bradycardia   . CPAP (continuous positive airway pressure) dependence   . Fatigue 06/23/2008  . Hyperlipidemia   . Hypertension   . Insomnia 04/19/2007  . Neuromuscular disorder (HCC)    neuropathy in feet  . Shingles 01/2016  . Sleep apnea   . Thrombocytopenia ([place])    Past Surgical History:  Procedure Laterality Date  . LUMBAR LAMINECTOMY  04/22/2007   L4-L5   discectomy 03/2008   Family History  Problem Relation Age of Onset  . Hyperthyroidism Mother   . Kidney disease Mother   . Hypertension Father   . Prostate cancer Paternal Grandfather    Social History   Socioeconomic History  . Marital status: Married    Spouse name: Not on file  . Number of children: Not on file  . Years of education: Not on file  . Highest education level: Not on file  Occupational History  . Not on file    Tobacco Use  . Smoking status: Former Smoker    Packs/day: 1.00    Years: 10.00    Pack years: 10.00    Quit date: 04/18/1992    Years since quitting: 27.8  . Smokeless tobacco: Never Used  . Tobacco comment: quit in 1994  Vaping Use  . Vaping Use: Never used  Substance and Sexual Activity  . Alcohol use: No    Alcohol/week: 0.0 standard drinks  . Drug use: No  . Sexual activity: Yes  Other Topics Concern  . Not on file  Social History Narrative  . Not on file   Social Determinants of Health   Financial Resource Strain:   . Difficulty of Paying Living Expenses: Not on file  Food Insecurity:   . Worried About Charity fundraiser in the Last Year: Not on file  . Ran Out of Food in the Last Year: Not on file  Transportation Needs:   . Lack of Transportation (Medical): Not on file  . Lack of Transportation (Non-Medical): Not on file  Physical Activity:   . Days of Exercise per Week: Not on file  . Minutes of Exercise per Session: Not on file  Stress:   . Feeling of Stress : Not on file  Social Connections:   . Frequency of Communication with Friends and Family: Not on file  .  Frequency of Social Gatherings with Friends and Family: Not on file  . Attends Religious Services: Not on file  . Active Member of Clubs or Organizations: Not on file  . Attends Archivist Meetings: Not on file  . Marital Status: Not on file    Outpatient Encounter Medications as of 02/20/2020  Medication Sig  . Alpha-Lipoic Acid 200 MG CAPS Take 600 mg by mouth daily.  Marland Kitchen amLODipine (NORVASC) 5 MG tablet Take 1 tablet by mouth once daily  . b complex vitamins tablet Take 1 tablet by mouth daily.  . Cholecalciferol (VITAMIN D3) 125 MCG (5000 UT) CAPS Take 1 capsule by mouth daily.  Marland Kitchen co-enzyme Q-10 30 MG capsule Take 100 mg by mouth daily.   Marland Kitchen MAGNESIUM OXIDE PO Take 100 mg by mouth daily.   Marland Kitchen omega-3 acid ethyl esters (LOVAZA) 1 G capsule Take by mouth 2 (two) times daily.  .  pantoprazole (PROTONIX) 40 MG tablet Take by mouth.  . Probiotic Product (PROBIOTIC PO) Take by mouth daily.  . tamsulosin (FLOMAX) 0.4 MG CAPS capsule Take 1 capsule (0.4 mg total) by mouth daily.  . Turmeric 500 MG CAPS Take 500 mg by mouth daily.   . vitamin B-12 (CYANOCOBALAMIN) 100 MCG tablet Take 100 mcg by mouth daily.   No facility-administered encounter medications on file as of 02/20/2020.    Review of Systems  Constitutional: Negative for appetite change and unexpected weight change.  HENT: Negative for congestion and sinus pressure.   Respiratory: Negative for cough, chest tightness and shortness of breath.   Cardiovascular: Negative for chest pain, palpitations and leg swelling.  Gastrointestinal: Negative for abdominal pain, diarrhea, nausea and vomiting.  Genitourinary: Negative for difficulty urinating and dysuria.  Musculoskeletal: Positive for back pain. Negative for joint swelling and myalgias.  Skin: Negative for color change and rash.  Neurological: Negative for dizziness, light-headedness and headaches.  Psychiatric/Behavioral: Negative for agitation and dysphoric mood.       Objective:    Physical Exam Vitals reviewed.  Constitutional:      General: He is not in acute distress.    Appearance: Normal appearance. He is well-developed.  HENT:     Head: Normocephalic and atraumatic.     Right Ear: External ear normal.     Left Ear: External ear normal.  Cardiovascular:     Rate and Rhythm: Normal rate and regular rhythm.  Pulmonary:     Effort: Pulmonary effort is normal. No respiratory distress.     Breath sounds: Normal breath sounds.  Abdominal:     General: Bowel sounds are normal.     Palpations: Abdomen is soft.     Tenderness: There is no abdominal tenderness.     Comments: Right lower quadrant - fullness with standing - appears to be c/w hernia.    Musculoskeletal:        General: No swelling or tenderness.     Cervical back: Neck supple. No  tenderness.  Lymphadenopathy:     Cervical: No cervical adenopathy.  Skin:    Findings: No erythema or rash.  Neurological:     Mental Status: He is alert.  Psychiatric:        Mood and Affect: Mood normal.        Behavior: Behavior normal.     BP 122/72   Pulse 75   Temp 98.4 F (36.9 C) (Oral)   Resp 16   Ht $R'6\' 4"'AF$  (1.93 m)   Wt 254 lb (115.2  kg)   SpO2 98%   BMI 30.92 kg/m  Wt Readings from Last 3 Encounters:  02/20/20 254 lb (115.2 kg)  02/13/20 257 lb 9.6 oz (116.8 kg)  02/06/20 255 lb (115.7 kg)     Lab Results  Component Value Date   WBC 4.2 02/13/2020   HGB 15.2 02/13/2020   HCT 43.4 02/13/2020   PLT 141 (L) 02/13/2020   GLUCOSE 93 02/20/2020   CHOL 184 02/20/2020   TRIG 65.0 02/20/2020   HDL 47.40 02/20/2020   LDLCALC 124 (H) 02/20/2020   ALT 12 02/20/2020   AST 15 02/20/2020   NA 141 02/20/2020   K 4.8 02/20/2020   CL 106 02/20/2020   CREATININE 1.28 02/20/2020   BUN 13 02/20/2020   CO2 31 02/20/2020   TSH 2.04 09/19/2019   PSA 0.55 10/25/2018   HGBA1C 5.4 09/19/2019    Ultrasound renal complete  Result Date: 01/09/2020 CLINICAL DATA:  Angiomyolipoma of the kidney. EXAM: RENAL / URINARY TRACT ULTRASOUND COMPLETE COMPARISON:  Ultrasound nov 2020 FINDINGS: Right Kidney: Renal measurements: 13.3 x 5.1 x 6.1 cm = volume: 214 mL. Echogenicity within normal limits. No hydronephrosis. Approximately 2.1 cm cyst in the interpolar kidney, not substantially changed compared to prior ultrasound. Left Kidney: Renal measurements: 13.4 x 5.2 x 6.0 cm = volume: 218 mL. Echogenicity within normal limits. No mass or hydronephrosis visualized. Bladder: Appears normal for degree of bladder distention. Bilateral ureteral jets identified. Other: None. IMPRESSION: 1. No hydronephrosis. 2. Similar size/appearance of a right interpolar renal cyst. 3. No sonographic evidence of a left renal angiomyolipoma. Normal appearance of the left kidney. Electronically Signed   By:  Margaretha Sheffield MD   On: 01/09/2020 15:57       Assessment & Plan:   Problem List Items Addressed This Visit    Vitamin D deficiency    Follow vitamin D level.       Thrombocytopenia ([place])    Evaluated by Dr Janese Banks.  Stable.  Released.  Follow cbc yearly.       Renal cyst    Followed by urology.  Renal ultrasound 01/09/20 - stable right renal cyst.       OSA (obstructive sleep apnea)    Continue cpap.       Hyperlipidemia    Low cholesterol idet and exercise.  Follow lipid panel.        Relevant Orders   Hepatic function panel (Completed)   Lipid panel (Completed)   Hyperglycemia    Low carb diet and exercise.  Follow met b and a1c.       Hernia, inguinal    Hernia - question of inguinal/lower abdominal hernia.  Discussed further w/up/ referral.  Monitor.        GERD (gastroesophageal reflux disease)    Continue protonix.        Benign essential HTN - Primary    Continue amlodipine.  Follow pressures.  Follow metabolic panel.       Relevant Orders   Basic metabolic panel (Completed)   Acute bilateral low back pain without sciatica    Previous back surgery.  Noticed increased low back pain to right knee.  Taking tylenol.  Stretches.  Follow.  May need PT.        Abdominal pain    Right lower quadrant pain.  Previous CT as outlined previous note.  On protonix.  Recommended benefiber.  Question of hernia.  Discussed referral.  Follow.  Hoang Reich, MD 

## 2020-02-23 ENCOUNTER — Encounter: Payer: Self-pay | Admitting: Internal Medicine

## 2020-02-23 DIAGNOSIS — M545 Low back pain, unspecified: Secondary | ICD-10-CM

## 2020-02-26 ENCOUNTER — Encounter: Payer: Self-pay | Admitting: Internal Medicine

## 2020-02-26 DIAGNOSIS — K4090 Unilateral inguinal hernia, without obstruction or gangrene, not specified as recurrent: Secondary | ICD-10-CM | POA: Insufficient documentation

## 2020-02-26 NOTE — Assessment & Plan Note (Signed)
Low cholesterol idet and exercise.  Follow lipid panel.

## 2020-02-26 NOTE — Assessment & Plan Note (Signed)
Hernia - question of inguinal/lower abdominal hernia.  Discussed further w/up/ referral.  Monitor.

## 2020-02-26 NOTE — Assessment & Plan Note (Signed)
Low carb diet and exercise.  Follow met b and a1c.  

## 2020-02-26 NOTE — Assessment & Plan Note (Signed)
Continue amlodipine.  Follow pressures.  Follow metabolic panel.   

## 2020-02-26 NOTE — Assessment & Plan Note (Signed)
Evaluated by Dr Janese Banks.  Stable.  Released.  Follow cbc yearly.

## 2020-02-26 NOTE — Assessment & Plan Note (Signed)
Continue cpap.  

## 2020-02-26 NOTE — Assessment & Plan Note (Signed)
Continue protonix  

## 2020-02-26 NOTE — Assessment & Plan Note (Signed)
Right lower quadrant pain.  Previous CT as outlined previous note.  On protonix.  Recommended benefiber.  Question of hernia.  Discussed referral.  Follow.

## 2020-02-26 NOTE — Assessment & Plan Note (Signed)
Previous back surgery.  Noticed increased low back pain to right knee.  Taking tylenol.  Stretches.  Follow.  May need PT.

## 2020-02-26 NOTE — Assessment & Plan Note (Signed)
Follow vitamin D level.  

## 2020-02-26 NOTE — Assessment & Plan Note (Addendum)
Followed by urology.  Renal ultrasound 01/09/20 - stable right renal cyst.

## 2020-02-27 NOTE — Telephone Encounter (Signed)
Please call and clarify with him if he wants PT and see if this helps or does he want ortho referral.  His note mentioned both.  I had discussed both.  I am ok with referral to PT (or ortho) if desires.  If persistent pain with PT, can refer to ortho then.  Just let me know.

## 2020-02-28 NOTE — Telephone Encounter (Signed)
Referral placed for PT referral.

## 2020-02-28 NOTE — Telephone Encounter (Signed)
Patient would like to try PT.

## 2020-02-28 NOTE — Telephone Encounter (Signed)
Pt called and said he would like a referral to do physical therapist first and then if that doesn't work he will go to ortho.

## 2020-03-01 ENCOUNTER — Other Ambulatory Visit: Payer: Self-pay | Admitting: Internal Medicine

## 2020-03-01 DIAGNOSIS — R1031 Right lower quadrant pain: Secondary | ICD-10-CM

## 2020-03-01 NOTE — Progress Notes (Signed)
Order placed for surgery referral.  

## 2020-03-01 NOTE — Telephone Encounter (Signed)
Please call pt and confirm symptoms having now and what medication he is taking.

## 2020-03-01 NOTE — Telephone Encounter (Signed)
Given the increased pain and given on my exam - I was more concerned regarding a possible hernia, I would like for one of the surgeons to evaluate him and see if they agree. If it is a hernia and causing increased pain, they would need to be who he needs to see.

## 2020-04-17 ENCOUNTER — Telehealth: Payer: Self-pay | Admitting: Internal Medicine

## 2020-04-17 DIAGNOSIS — I10 Essential (primary) hypertension: Secondary | ICD-10-CM

## 2020-04-19 MED ORDER — AMLODIPINE BESYLATE 5 MG PO TABS: 5.0000 mg | ORAL_TABLET | Freq: Every day | ORAL | 0 refills | Status: DC

## 2020-04-19 NOTE — Addendum Note (Signed)
Addended by: Elpidio Galea T on: 04/19/2020 12:45 PM   Modules accepted: Orders

## 2020-04-19 NOTE — Telephone Encounter (Signed)
Pt states that pharmacy does not have amLODipine (NORVASC) 5 MG tablet. Please resend

## 2020-04-22 ENCOUNTER — Encounter: Payer: Self-pay | Admitting: Internal Medicine

## 2020-05-03 ENCOUNTER — Other Ambulatory Visit: Payer: Self-pay | Admitting: Urology

## 2020-05-03 DIAGNOSIS — D1771 Benign lipomatous neoplasm of kidney: Secondary | ICD-10-CM

## 2020-08-02 ENCOUNTER — Other Ambulatory Visit: Payer: Self-pay | Admitting: Internal Medicine

## 2020-08-02 DIAGNOSIS — I10 Essential (primary) hypertension: Secondary | ICD-10-CM

## 2020-08-13 ENCOUNTER — Other Ambulatory Visit: Payer: Self-pay | Admitting: Urology

## 2020-08-13 DIAGNOSIS — D1771 Benign lipomatous neoplasm of kidney: Secondary | ICD-10-CM

## 2020-08-20 ENCOUNTER — Ambulatory Visit (INDEPENDENT_AMBULATORY_CARE_PROVIDER_SITE_OTHER): Payer: Managed Care, Other (non HMO) | Admitting: Internal Medicine

## 2020-08-20 ENCOUNTER — Other Ambulatory Visit: Payer: Self-pay

## 2020-08-20 VITALS — BP 122/74 | HR 70 | Temp 97.4°F | Resp 16 | Ht 76.0 in | Wt 253.6 lb

## 2020-08-20 DIAGNOSIS — I10 Essential (primary) hypertension: Secondary | ICD-10-CM | POA: Diagnosis not present

## 2020-08-20 DIAGNOSIS — D696 Thrombocytopenia, unspecified: Secondary | ICD-10-CM

## 2020-08-20 DIAGNOSIS — Z0000 Encounter for general adult medical examination without abnormal findings: Secondary | ICD-10-CM

## 2020-08-20 DIAGNOSIS — Z125 Encounter for screening for malignant neoplasm of prostate: Secondary | ICD-10-CM

## 2020-08-20 DIAGNOSIS — G5603 Carpal tunnel syndrome, bilateral upper limbs: Secondary | ICD-10-CM

## 2020-08-20 DIAGNOSIS — E785 Hyperlipidemia, unspecified: Secondary | ICD-10-CM

## 2020-08-20 DIAGNOSIS — R739 Hyperglycemia, unspecified: Secondary | ICD-10-CM | POA: Diagnosis not present

## 2020-08-20 DIAGNOSIS — G4733 Obstructive sleep apnea (adult) (pediatric): Secondary | ICD-10-CM

## 2020-08-20 DIAGNOSIS — G629 Polyneuropathy, unspecified: Secondary | ICD-10-CM

## 2020-08-20 DIAGNOSIS — R200 Anesthesia of skin: Secondary | ICD-10-CM

## 2020-08-20 DIAGNOSIS — K219 Gastro-esophageal reflux disease without esophagitis: Secondary | ICD-10-CM

## 2020-08-20 DIAGNOSIS — N281 Cyst of kidney, acquired: Secondary | ICD-10-CM

## 2020-08-20 LAB — TSH: TSH: 2.97 u[IU]/mL (ref 0.35–4.50)

## 2020-08-20 LAB — BASIC METABOLIC PANEL
BUN: 15 mg/dL (ref 6–23)
CO2: 28 mEq/L (ref 19–32)
Calcium: 9.1 mg/dL (ref 8.4–10.5)
Chloride: 106 mEq/L (ref 96–112)
Creatinine, Ser: 1.1 mg/dL (ref 0.40–1.50)
GFR: 74.96 mL/min (ref >60.00)
Glucose, Bld: 89 mg/dL (ref 70–99)
Potassium: 4 mEq/L (ref 3.5–5.1)
Sodium: 141 mEq/L (ref 135–145)

## 2020-08-20 LAB — CBC WITH DIFFERENTIAL/PLATELET
Basophils Absolute: 0 10*3/uL (ref 0.0–0.1)
Basophils Relative: 0.5 % (ref 0.0–3.0)
Eosinophils Absolute: 0.1 10*3/uL (ref 0.0–0.7)
Eosinophils Relative: 2.3 % (ref 0.0–5.0)
HCT: 41 % (ref 39.0–52.0)
Hemoglobin: 13.9 g/dL (ref 13.0–17.0)
Lymphocytes Relative: 18.6 % (ref 12.0–46.0)
Lymphs Abs: 0.9 10*3/uL (ref 0.7–4.0)
MCHC: 34 g/dL (ref 30.0–36.0)
MCV: 92.3 fl (ref 78.0–100.0)
Monocytes Absolute: 0.4 10*3/uL (ref 0.1–1.0)
Monocytes Relative: 9.2 % (ref 3.0–12.0)
Neutro Abs: 3.2 10*3/uL (ref 1.4–7.7)
Neutrophils Relative %: 69.4 % (ref 43.0–77.0)
Platelets: 123 10*3/uL — ABNORMAL LOW (ref 150.0–400.0)
RBC: 4.44 Mil/uL (ref 4.22–5.81)
RDW: 12.8 % (ref 11.5–15.5)
WBC: 4.6 10*3/uL (ref 4.0–10.5)

## 2020-08-20 LAB — LIPID PANEL
Cholesterol: 159 mg/dL (ref 0–200)
HDL: 43 mg/dL (ref >39.00)
LDL Cholesterol: 104 mg/dL — ABNORMAL HIGH (ref 0–99)
NonHDL: 116.33
Total CHOL/HDL Ratio: 4
Triglycerides: 64 mg/dL (ref 0.0–149.0)
VLDL: 12.8 mg/dL (ref 0.0–40.0)

## 2020-08-20 LAB — HEPATIC FUNCTION PANEL
ALT: 16 U/L (ref 0–53)
AST: 18 U/L (ref 0–37)
Albumin: 4.1 g/dL (ref 3.5–5.2)
Alkaline Phosphatase: 55 U/L (ref 39–117)
Bilirubin, Direct: 0.2 mg/dL (ref 0.0–0.3)
Total Bilirubin: 1 mg/dL (ref 0.2–1.2)
Total Protein: 6.1 g/dL (ref 6.0–8.3)

## 2020-08-20 LAB — PSA: PSA: 0.47 ng/mL (ref 0.10–4.00)

## 2020-08-20 NOTE — Assessment & Plan Note (Signed)
Physical today 08/20/20.  Colonoscopy 09/2017.  Per pt, due f/u in 10 years.  Check psa.

## 2020-08-20 NOTE — Progress Notes (Signed)
Patient ID: Clifford Alexander, male   DOB: 1965-06-21, 55 y.o.   MRN: [id number]   Subjective:    Patient ID: Clifford Alexander, male    DOB: [birth year], 55 y.o.   MRN: [id number]  HPI This visit occurred during the SARS-CoV-2 public health emergency.  Safety protocols were in place, including screening questions prior to the visit, additional usage of staff PPE, and extensive cleaning of exam room while observing appropriate contact time as indicated for disinfecting solutions.  Patient here for his physical exam.  He reports his job has changed and he is home more.  Not driving as much. This has decreased his stress.  Sleeping better - using cpap.  Energy is better.  Back is better.  Does report some left knee discomfort when he has been driving (straight drive) for a while.  Does not bother him walking or at other times.  No increased pain.  No chest pain or sob with increased activity or exertion.  No cough or congestion. No nausea or vomiting.  Bowels moving.  Some numbness in feet.    Past Medical History:  Diagnosis Date  . Bradycardia   . CPAP (continuous positive airway pressure) dependence   . Fatigue 06/23/2008  . Hyperlipidemia   . Hypertension   . Insomnia 04/19/2007  . Neuromuscular disorder (HCC)    neuropathy in feet  . Shingles 01/2016  . Sleep apnea   . Thrombocytopenia ([place])    Past Surgical History:  Procedure Laterality Date  . LUMBAR LAMINECTOMY  04/22/2007   L4-L5   discectomy 03/2008   Family History  Problem Relation Age of Onset  . Hyperthyroidism Mother   . Kidney disease Mother   . Hypertension Father   . Prostate cancer Paternal Grandfather    Social History   Socioeconomic History  . Marital status: Married    Spouse name: Not on file  . Number of children: Not on file  . Years of education: Not on file  . Highest education level: Not on file  Occupational History  . Not on file  Tobacco Use  . Smoking status: Former Smoker    Packs/day: 1.00     Years: 10.00    Pack years: 10.00    Quit date: 04/18/1992    Years since quitting: 28.3  . Smokeless tobacco: Never Used  . Tobacco comment: quit in 1994  Vaping Use  . Vaping Use: Never used  Substance and Sexual Activity  . Alcohol use: No    Alcohol/week: 0.0 standard drinks  . Drug use: No  . Sexual activity: Yes  Other Topics Concern  . Not on file  Social History Narrative  . Not on file   Social Determinants of Health   Financial Resource Strain: Not on file  Food Insecurity: Not on file  Transportation Needs: Not on file  Physical Activity: Not on file  Stress: Not on file  Social Connections: Not on file    Outpatient Encounter Medications as of 08/20/2020  Medication Sig  . Alpha-Lipoic Acid 200 MG CAPS Take 600 mg by mouth daily.  Marland Kitchen amLODipine (NORVASC) 5 MG tablet Take 1 tablet by mouth once daily  . Cholecalciferol (VITAMIN D3) 125 MCG (5000 UT) CAPS Take 1 capsule by mouth daily.  Marland Kitchen MAGNESIUM OXIDE PO Take 400 mg by mouth daily.  Marland Kitchen omega-3 acid ethyl esters (LOVAZA) 1 G capsule Take by mouth 2 (two) times daily.  . pantoprazole (PROTONIX) 40 MG tablet Take by mouth.  Marland Kitchen  Probiotic Product (PROBIOTIC PO) Take by mouth daily.  . tamsulosin (FLOMAX) 0.4 MG CAPS capsule Take 1 capsule by mouth once daily  . Turmeric 500 MG CAPS Take 500 mg by mouth daily.   . vitamin B-12 (CYANOCOBALAMIN) 100 MCG tablet Take 100 mcg by mouth daily.  . [DISCONTINUED] b complex vitamins tablet Take 1 tablet by mouth daily. (Patient not taking: Reported on 08/20/2020)  . [DISCONTINUED] co-enzyme Q-10 30 MG capsule Take 100 mg by mouth daily.  (Patient not taking: Reported on 08/20/2020)   No facility-administered encounter medications on file as of 08/20/2020.    Review of Systems  Constitutional: Negative for appetite change and unexpected weight change.  HENT: Negative for congestion, sinus pressure and sore throat.   Eyes: Negative for pain and visual disturbance.  Respiratory:  Negative for cough, chest tightness and shortness of breath.   Cardiovascular: Negative for chest pain, palpitations and leg swelling.  Gastrointestinal: Negative for abdominal pain, diarrhea, nausea and vomiting.  Genitourinary: Negative for difficulty urinating and dysuria.  Musculoskeletal: Negative for joint swelling and myalgias.       Left knee pain as outlined.  Notices after driving for a while.    Skin: Negative for color change and rash.  Neurological: Negative for dizziness, light-headedness and headaches.  Hematological: Negative for adenopathy. Does not bruise/bleed easily.  Psychiatric/Behavioral: Negative for agitation and dysphoric mood.       Objective:    Physical Exam Vitals reviewed.  Constitutional:      General: He is not in acute distress.    Appearance: Normal appearance. He is well-developed.  HENT:     Head: Normocephalic and atraumatic.     Right Ear: External ear normal.     Left Ear: External ear normal.     Mouth/Throat:     Pharynx: No oropharyngeal exudate.  Eyes:     General: No scleral icterus.       Right eye: No discharge.        Left eye: No discharge.     Conjunctiva/sclera: Conjunctivae normal.  Neck:     Thyroid: No thyromegaly.  Cardiovascular:     Rate and Rhythm: Normal rate and regular rhythm.  Pulmonary:     Effort: No respiratory distress.     Breath sounds: Normal breath sounds. No wheezing.  Abdominal:     General: Bowel sounds are normal.     Palpations: Abdomen is soft.     Tenderness: There is no abdominal tenderness.  Musculoskeletal:        General: No swelling or tenderness.     Cervical back: Neck supple. No tenderness.     Comments: No pain to palpation - left knee. No pain with resistance against flexion and extension.   Lymphadenopathy:     Cervical: No cervical adenopathy.  Skin:    Findings: No erythema or rash.  Neurological:     Mental Status: He is alert and oriented to person, place, and time.   Psychiatric:        Mood and Affect: Mood normal.        Behavior: Behavior normal.     BP 122/74   Pulse 70   Temp (!) 97.4 F (36.3 C)   Resp 16   Ht 6' 4"  (1.93 m)   Wt 253 lb 9.6 oz (115 kg)   SpO2 99%   BMI 30.87 kg/m  Wt Readings from Last 3 Encounters:  08/20/20 253 lb 9.6 oz (115 kg)  02/20/20 254  lb (115.2 kg)  02/13/20 257 lb 9.6 oz (116.8 kg)     Lab Results  Component Value Date   WBC 4.6 08/20/2020   HGB 13.9 08/20/2020   HCT 41.0 08/20/2020   PLT 123.0 (L) 08/20/2020   GLUCOSE 89 08/20/2020   CHOL 159 08/20/2020   TRIG 64.0 08/20/2020   HDL 43.00 08/20/2020   LDLCALC 104 (H) 08/20/2020   ALT 16 08/20/2020   AST 18 08/20/2020   NA 141 08/20/2020   K 4.0 08/20/2020   CL 106 08/20/2020   CREATININE 1.10 08/20/2020   BUN 15 08/20/2020   CO2 28 08/20/2020   TSH 2.97 08/20/2020   PSA 0.47 08/20/2020   HGBA1C 5.4 09/19/2019    Ultrasound renal complete  Result Date: 01/09/2020 CLINICAL DATA:  Angiomyolipoma of the kidney. EXAM: RENAL / URINARY TRACT ULTRASOUND COMPLETE COMPARISON:  Ultrasound nov 2020 FINDINGS: Right Kidney: Renal measurements: 13.3 x 5.1 x 6.1 cm = volume: 214 mL. Echogenicity within normal limits. No hydronephrosis. Approximately 2.1 cm cyst in the interpolar kidney, not substantially changed compared to prior ultrasound. Left Kidney: Renal measurements: 13.4 x 5.2 x 6.0 cm = volume: 218 mL. Echogenicity within normal limits. No mass or hydronephrosis visualized. Bladder: Appears normal for degree of bladder distention. Bilateral ureteral jets identified. Other: None. IMPRESSION: 1. No hydronephrosis. 2. Similar size/appearance of a right interpolar renal cyst. 3. No sonographic evidence of a left renal angiomyolipoma. Normal appearance of the left kidney. Electronically Signed   By: Margaretha Sheffield MD   On: 01/09/2020 15:57       Assessment & Plan:   Problem List Items Addressed This Visit    Benign essential HTN    Blood  pressure as outlined.  Continue amlodipine.  Follow pressures.  Follow metabolic panel.       Relevant Orders   Basic metabolic panel (Completed)   TSH (Completed)   CTS (carpal tunnel syndrome)    Evaluated by neurology.       GERD (gastroesophageal reflux disease)    No upper symptoms reported.  On protonix.       Health care maintenance    Physical today 08/20/20.  Colonoscopy 09/2017.  Per pt, due f/u in 10 years.  Check psa.       Hyperglycemia    Low carb diet and exercise.  Follow met b and a1c.       Hyperlipidemia    Low cholesterol diet and exercise.  Follow lipid panel.       Relevant Orders   Hepatic function panel (Completed)   Lipid panel (Completed)   Numbness in feet    Has been evaluated by neurology.        OSA (obstructive sleep apnea)    Continue cpap.       Polyneuropathy    Worked up by neurology.  Diagnosed with CTS.  Some foot tingling.  Per note, discussed alpha lipoic acid.  Follow.        Renal cyst    Evaluated by urology.  Renal ultrasound 12/2019 - stable right renal cyst.        Thrombocytopenia (HCC)    Platelet count slightly decreased last check.  Recheck today to confirm stable.       Relevant Orders   CBC with Differential/Platelet (Completed)    Other Visit Diagnoses    Routine general medical examination at a health care facility    -  Primary   Prostate cancer screening  Relevant Orders   PSA (Completed)       Einar Pheasant, MD

## 2020-08-20 NOTE — Assessment & Plan Note (Signed)
Platelet count slightly decreased last check.  Recheck today to confirm stable.

## 2020-08-21 ENCOUNTER — Other Ambulatory Visit: Payer: Self-pay | Admitting: Internal Medicine

## 2020-08-21 DIAGNOSIS — D696 Thrombocytopenia, unspecified: Secondary | ICD-10-CM

## 2020-08-21 NOTE — Progress Notes (Signed)
Order placed for f/u [facility].   

## 2020-08-27 ENCOUNTER — Encounter: Payer: Self-pay | Admitting: Internal Medicine

## 2020-08-27 NOTE — Assessment & Plan Note (Signed)
Continue cpap.  

## 2020-08-27 NOTE — Assessment & Plan Note (Signed)
Worked up by neurology.  Diagnosed with CTS.  Some foot tingling.  Per note, discussed alpha lipoic acid.  Follow.

## 2020-08-27 NOTE — Assessment & Plan Note (Signed)
Has been evaluated by neurology. 

## 2020-08-27 NOTE — Assessment & Plan Note (Signed)
Low carb diet and exercise.  Follow met b and a1c.  

## 2020-08-27 NOTE — Assessment & Plan Note (Signed)
No upper symptoms reported.  On protonix.   

## 2020-08-27 NOTE — Assessment & Plan Note (Signed)
Evaluated by urology.  Renal ultrasound 12/2019 - stable right renal cyst.

## 2020-08-27 NOTE — Assessment & Plan Note (Signed)
Blood pressure as outlined.  Continue amlodipine.   Follow pressures.  Follow metabolic panel.  

## 2020-08-27 NOTE — Assessment & Plan Note (Signed)
Evaluated by neurology.  

## 2020-08-27 NOTE — Assessment & Plan Note (Signed)
Low cholesterol diet and exercise.  Follow lipid panel.   

## 2020-10-08 ENCOUNTER — Other Ambulatory Visit: Payer: Self-pay

## 2020-10-08 ENCOUNTER — Other Ambulatory Visit (INDEPENDENT_AMBULATORY_CARE_PROVIDER_SITE_OTHER): Payer: Managed Care, Other (non HMO)

## 2020-10-08 DIAGNOSIS — D696 Thrombocytopenia, unspecified: Secondary | ICD-10-CM | POA: Diagnosis not present

## 2020-10-08 LAB — CBC WITH DIFFERENTIAL/PLATELET
Basophils Absolute: 0 10*3/uL (ref 0.0–0.1)
Basophils Relative: 0.4 % (ref 0.0–3.0)
Eosinophils Absolute: 0 10*3/uL (ref 0.0–0.7)
Eosinophils Relative: 0.9 % (ref 0.0–5.0)
HCT: 44.3 % (ref 39.0–52.0)
Hemoglobin: 14.9 g/dL (ref 13.0–17.0)
Lymphocytes Relative: 29 % (ref 12.0–46.0)
Lymphs Abs: 1.5 10*3/uL (ref 0.7–4.0)
MCHC: 33.6 g/dL (ref 30.0–36.0)
MCV: 92.4 fl (ref 78.0–100.0)
Monocytes Absolute: 0.4 10*3/uL (ref 0.1–1.0)
Monocytes Relative: 8.4 % (ref 3.0–12.0)
Neutro Abs: 3.3 10*3/uL (ref 1.4–7.7)
Neutrophils Relative %: 61.3 % (ref 43.0–77.0)
Platelets: 146 10*3/uL — ABNORMAL LOW (ref 150.0–400.0)
RBC: 4.79 Mil/uL (ref 4.22–5.81)
RDW: 13.2 % (ref 11.5–15.5)
WBC: 5.3 10*3/uL (ref 4.0–10.5)

## 2020-12-24 ENCOUNTER — Other Ambulatory Visit: Payer: Self-pay | Admitting: Urology

## 2020-12-24 DIAGNOSIS — D1771 Benign lipomatous neoplasm of kidney: Secondary | ICD-10-CM

## 2021-02-06 ENCOUNTER — Ambulatory Visit: Payer: Self-pay | Admitting: Urology

## 2021-02-11 ENCOUNTER — Ambulatory Visit (INDEPENDENT_AMBULATORY_CARE_PROVIDER_SITE_OTHER): Payer: Managed Care, Other (non HMO) | Admitting: Physician Assistant

## 2021-02-11 ENCOUNTER — Other Ambulatory Visit: Payer: Self-pay

## 2021-02-11 VITALS — BP 137/73 | HR 80 | Ht 76.0 in | Wt 252.0 lb

## 2021-02-11 DIAGNOSIS — N401 Enlarged prostate with lower urinary tract symptoms: Secondary | ICD-10-CM

## 2021-02-11 DIAGNOSIS — R3914 Feeling of incomplete bladder emptying: Secondary | ICD-10-CM

## 2021-02-11 LAB — BLADDER SCAN AMB NON-IMAGING

## 2021-02-11 MED ORDER — SILODOSIN 8 MG PO CAPS: 8.0000 mg | ORAL_CAPSULE | Freq: Every day | ORAL | 3 refills | Status: DC

## 2021-02-11 NOTE — Progress Notes (Signed)
02/11/2021 4:55 PM   Clifford Alexander 05/14/1965 [id number]  CC: Chief Complaint  Patient presents with   Benign Prostatic Hypertrophy   HPI: Clifford Alexander is a 55 y.o. male with PMH BPH with weak stream on Flomax and OSA with nocturia on CPAP who presents today for annual follow-up.   Today he reports continuing Flomax.  He was also taking an additional urinary supplement containing saw palmetto, but he ran out of this approximately 1 month ago.  Ever since, he has had some dizziness and unsteadiness on his feet, especially with standing.  In regard to his urinary symptoms, he notes he sometimes has a weak urinary stream and some postvoid dribbling, but he is overall doing well.  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  He continues to use CPAP and this has resolved his nocturia.  His PCP performs his annual prostate exams and is monitoring his PSA.  Most recent PSA 0.47 on 08/20/2020.  IPSS 4/mostly satisfied as below.  PVR 270mL.   IPSS     Row Name 02/11/21 1400         International Prostate Symptom Score   How often have you had the sensation of not emptying your bladder? Less than 1 in 5     How often have you had to urinate less than every two hours? Less than 1 in 5 times     How often have you found you stopped and started again several times when you urinated? Not at All     How often have you found it difficult to postpone urination? Not at All     How often have you had a weak urinary stream? Less than 1 in 5 times     How often have you had to strain to start urination? Not at All     How many times did you typically get up at night to urinate? 1 Time     Total IPSS Score 4       Quality of Life due to urinary symptoms   If you were to spend the rest of your life with your urinary condition just the way it is now how would you feel about that? Mostly Satisfied              PMH: Past Medical History:  Diagnosis Date    Bradycardia    CPAP (continuous positive airway pressure) dependence    Fatigue 06/23/2008   Hyperlipidemia    Hypertension    Insomnia 04/19/2007   Neuromuscular disorder (HCC)    neuropathy in feet   Shingles 01/2016   Sleep apnea    Thrombocytopenia [facility])     Surgical History: Past Surgical History:  Procedure Laterality Date   LUMBAR LAMINECTOMY  04/22/2007   L4-L5   discectomy 03/2008    Home Medications:  Allergies as of 02/11/2021       Reactions   Naproxen Shortness Of Breath   Ciprofloxacin    Ibuprofen         Medication List        Accurate as of February 11, 2021  4:55 PM. If you have any questions, ask your nurse or doctor.          STOP taking these medications    tamsulosin 0.4 MG Caps capsule Commonly known as: FLOMAX Stopped by: Debroah Loop, PA-C       TAKE these medications    Alpha-Lipoic Acid  200 MG Caps Take 600 mg by mouth daily.   amLODipine 5 MG tablet Commonly known as: NORVASC Take 1 tablet by mouth once daily   MAGNESIUM OXIDE PO Take 400 mg by mouth daily.   omega-3 acid ethyl esters 1 g capsule Commonly known as: LOVAZA Take by mouth 2 (two) times daily.   pantoprazole 40 MG tablet Commonly known as: PROTONIX Take by mouth.   PROBIOTIC PO Take by mouth daily.   silodosin 8 MG Caps capsule Commonly known as: RAPAFLO Take 1 capsule (8 mg total) by mouth daily with breakfast. Started by: Debroah Loop, PA-C   Turmeric 500 MG Caps Take 500 mg by mouth daily.   vitamin B-12 100 MCG tablet Commonly known as: CYANOCOBALAMIN Take 100 mcg by mouth daily.   Vitamin D3 125 MCG (5000 UT) Caps Take 1 capsule by mouth daily.        Allergies:  Allergies  Allergen Reactions   Naproxen Shortness Of Breath   Ciprofloxacin    Ibuprofen     Family History: Family History  Problem Relation Age of Onset   Hyperthyroidism Mother    Kidney disease Mother    Hypertension Father     Prostate cancer Paternal Grandfather     Social History:   reports that he quit smoking about 28 years ago. He has a 10.00 pack-year smoking history. He has never used smokeless tobacco. He reports that he does not drink alcohol and does not use drugs.  Physical Exam: BP 137/73   Pulse 80   Ht 6\' 4"  (1.93 m)   Wt 252 lb (114.3 kg)   BMI 30.67 kg/m   Constitutional:  Alert and oriented, no acute distress, nontoxic appearing HEENT: [place], AT Cardiovascular: No clubbing, cyanosis, or edema Respiratory: Normal respiratory effort, no increased work of breathing Skin: No rashes, bruises or suspicious lesions Neurologic: Grossly intact, no focal deficits, moving all 4 extremities Psychiatric: Normal mood and affect  Laboratory Data: Results for orders placed or performed in visit on 02/11/21  Bladder Scan (Post Void Residual) in office  Result Value Ref Range   Scan Result 239mL    Assessment & Plan:   1. Benign prostatic hyperplasia with incomplete bladder emptying Slightly elevated PVR today, however patient reports minimal bother with his urinary symptoms.  We will plan to continue alpha blockers as below.  We discussed that worsening voiding symptoms, recurrent UTI, or rising residual male be indications for further intervention including pharmacotherapy with finasteride versus surgical intervention.  Okay to defer for now.  We discussed that alpha blockers including tamsulosin can cause orthostasis, however this is most common when these medications are started.  His recent dizziness may be more associated with discontinuation of an additional supplement, however I did offer to switch him to silodosin to see if he would tolerate this better.  I counseled him that silodosin does carry a slightly higher risk of retrograde ejaculation and is likely to be more expensive.  Patient wishes to try this med, will prescribe for 1 year but okay to switch back to tamsulosin if he prefers.  Encouraged  him to void as needed and try to avoid holding his urine.  I provided him a urinal in clinic today and he expressed understanding.  PSA stable.  Patient to continue annual monitoring and rectal exams with his PCP. - Bladder Scan (Post Void Residual) in office - silodosin (RAPAFLO) 8 MG CAPS capsule; Take 1 capsule (8 mg total) by mouth daily with  breakfast.  Dispense: 90 capsule; Refill: 3  Return in about 1 year (around 02/11/2022) for Annual BPH f/u with IPSS, PVR.  Debroah Loop, PA-C  [facility] [address] [place], [place] [address] [phone]

## 2021-02-17 ENCOUNTER — Other Ambulatory Visit: Payer: Self-pay | Admitting: Internal Medicine

## 2021-02-17 DIAGNOSIS — I10 Essential (primary) hypertension: Secondary | ICD-10-CM

## 2021-02-19 ENCOUNTER — Other Ambulatory Visit: Payer: Self-pay | Admitting: Internal Medicine

## 2021-02-19 DIAGNOSIS — I10 Essential (primary) hypertension: Secondary | ICD-10-CM

## 2021-02-20 MED ORDER — AMLODIPINE BESYLATE 5 MG PO TABS: 5.0000 mg | ORAL_TABLET | Freq: Every day | ORAL | 1 refills | Status: DC

## 2021-02-25 ENCOUNTER — Encounter: Payer: Self-pay | Admitting: Internal Medicine

## 2021-02-25 ENCOUNTER — Ambulatory Visit: Payer: Managed Care, Other (non HMO) | Admitting: Internal Medicine

## 2021-02-25 ENCOUNTER — Other Ambulatory Visit: Payer: Self-pay

## 2021-02-25 ENCOUNTER — Ambulatory Visit (INDEPENDENT_AMBULATORY_CARE_PROVIDER_SITE_OTHER): Payer: Managed Care, Other (non HMO)

## 2021-02-25 DIAGNOSIS — N281 Cyst of kidney, acquired: Secondary | ICD-10-CM

## 2021-02-25 DIAGNOSIS — D696 Thrombocytopenia, unspecified: Secondary | ICD-10-CM

## 2021-02-25 DIAGNOSIS — R519 Headache, unspecified: Secondary | ICD-10-CM

## 2021-02-25 DIAGNOSIS — R6889 Other general symptoms and signs: Secondary | ICD-10-CM

## 2021-02-25 DIAGNOSIS — M542 Cervicalgia: Secondary | ICD-10-CM | POA: Diagnosis not present

## 2021-02-25 DIAGNOSIS — I10 Essential (primary) hypertension: Secondary | ICD-10-CM

## 2021-02-25 DIAGNOSIS — R739 Hyperglycemia, unspecified: Secondary | ICD-10-CM

## 2021-02-25 DIAGNOSIS — E785 Hyperlipidemia, unspecified: Secondary | ICD-10-CM

## 2021-02-25 DIAGNOSIS — K219 Gastro-esophageal reflux disease without esophagitis: Secondary | ICD-10-CM

## 2021-02-25 DIAGNOSIS — R42 Dizziness and giddiness: Secondary | ICD-10-CM

## 2021-02-25 DIAGNOSIS — G4733 Obstructive sleep apnea (adult) (pediatric): Secondary | ICD-10-CM

## 2021-02-25 LAB — CBC WITH DIFFERENTIAL/PLATELET
Basophils Absolute: 0 10*3/uL (ref 0.0–0.1)
Basophils Relative: 0.5 % (ref 0.0–3.0)
Eosinophils Absolute: 0.1 10*3/uL (ref 0.0–0.7)
Eosinophils Relative: 1.4 % (ref 0.0–5.0)
HCT: 44.3 % (ref 39.0–52.0)
Hemoglobin: 14.7 g/dL (ref 13.0–17.0)
Lymphocytes Relative: 25.7 % (ref 12.0–46.0)
Lymphs Abs: 1.2 10*3/uL (ref 0.7–4.0)
MCHC: 33.2 g/dL (ref 30.0–36.0)
MCV: 93.2 fl (ref 78.0–100.0)
Monocytes Absolute: 0.4 10*3/uL (ref 0.1–1.0)
Monocytes Relative: 9.4 % (ref 3.0–12.0)
Neutro Abs: 3 10*3/uL (ref 1.4–7.7)
Neutrophils Relative %: 63 % (ref 43.0–77.0)
Platelets: 141 10*3/uL — ABNORMAL LOW (ref 150.0–400.0)
RBC: 4.75 Mil/uL (ref 4.22–5.81)
RDW: 13.2 % (ref 11.5–15.5)
WBC: 4.8 10*3/uL (ref 4.0–10.5)

## 2021-02-25 LAB — BASIC METABOLIC PANEL
BUN: 18 mg/dL (ref 6–23)
CO2: 29 mEq/L (ref 19–32)
Calcium: 9.4 mg/dL (ref 8.4–10.5)
Chloride: 104 mEq/L (ref 96–112)
Creatinine, Ser: 1.22 mg/dL (ref 0.40–1.50)
GFR: 65.96 mL/min (ref >60.00)
Glucose, Bld: 95 mg/dL (ref 70–99)
Potassium: 4.6 mEq/L (ref 3.5–5.1)
Sodium: 140 mEq/L (ref 135–145)

## 2021-02-25 LAB — HEPATIC FUNCTION PANEL
ALT: 12 U/L (ref 0–53)
AST: 13 U/L (ref 0–37)
Albumin: 4.2 g/dL (ref 3.5–5.2)
Alkaline Phosphatase: 56 U/L (ref 39–117)
Bilirubin, Direct: 0.1 mg/dL (ref 0.0–0.3)
Total Bilirubin: 0.7 mg/dL (ref 0.2–1.2)
Total Protein: 6.5 g/dL (ref 6.0–8.3)

## 2021-02-25 LAB — LIPID PANEL
Cholesterol: 184 mg/dL (ref 0–200)
HDL: 47.7 mg/dL (ref >39.00)
LDL Cholesterol: 124 mg/dL — ABNORMAL HIGH (ref 0–99)
NonHDL: 136.62
Total CHOL/HDL Ratio: 4
Triglycerides: 64 mg/dL (ref 0.0–149.0)
VLDL: 12.8 mg/dL (ref 0.0–40.0)

## 2021-02-25 LAB — HEMOGLOBIN A1C: Hgb A1c MFr Bld: 5.6 % (ref 4.6–6.5)

## 2021-02-25 NOTE — Patient Instructions (Signed)
Take protonix daily - 30 minutes before your evening meal.

## 2021-02-25 NOTE — Progress Notes (Addendum)
Patient ID: RIYAD KEENA, male   DOB: 1965-04-29, 55 y.o.   MRN: [id number]   Subjective:    Patient ID: DESHON HSIAO, male    DOB: 1965-06-13, 55 y.o.   MRN: [id number]  This visit occurred during the SARS-CoV-2 public health emergency.  Safety protocols were in place, including screening questions prior to the visit, additional usage of staff PPE, and extensive cleaning of exam room while observing appropriate contact time as indicated for disinfecting solutions.   Patient here for a scheduled follow up.   Chief Complaint  Patient presents with   Hypertension   .   HPI Has noticed over the last month - increased throat congestion and post nasal drainage.  Describes thick mucus in his throat.  No fever.  No chest congestion.  Occasional cough from the mucus.  No chest pain or sob.  Does report acid reflux.  Worsened recently.  Not watching his diet as well.  Has also been having some neck issues - neck pain.  Saw chiropractor.  Helped some.  Reports intermittent light headedness/dizziness.  Previously noted room spinning.  No spinning now.  Light headedness appears to be worse in am and then better as day progresses.  Using cpap.  Feels rested.  Was questioning if this could be drying him out and contributing to the increased mucus. Humidified.  Discussed contacting cpap supply company to assess tubing, humidified air, etc.  Recently saw urology.  Had medication changed to rapaflo.  Light headedness is better since this change.     Past Medical History:  Diagnosis Date   Bradycardia    CPAP (continuous positive airway pressure) dependence    Fatigue 06/23/2008   Hyperlipidemia    Hypertension    Insomnia 04/19/2007   Neuromuscular disorder (HCC)    neuropathy in feet   Shingles 01/2016   Sleep apnea    Thrombocytopenia (HCC)    Past Surgical History:  Procedure Laterality Date   LUMBAR LAMINECTOMY  04/22/2007   L4-L5   discectomy 03/2008   Family History  Problem Relation Age  of Onset   Hyperthyroidism Mother    Kidney disease Mother    Hypertension Father    Prostate cancer Paternal Grandfather    Social History   Socioeconomic History   Marital status: Married    Spouse name: Not on file   Number of children: Not on file   Years of education: Not on file   Highest education level: Not on file  Occupational History   Not on file  Tobacco Use   Smoking status: Former    Packs/day: 1.00    Years: 10.00    Pack years: 10.00    Types: Cigarettes    Quit date: 04/18/1992    Years since quitting: 28.8   Smokeless tobacco: Never   Tobacco comments:    quit in 1994  Vaping Use   Vaping Use: Never used  Substance and Sexual Activity   Alcohol use: No    Alcohol/week: 0.0 standard drinks   Drug use: No   Sexual activity: Yes  Other Topics Concern   Not on file  Social History Narrative   Not on file   Social Determinants of Health   Financial Resource Strain: Not on file  Food Insecurity: Not on file  Transportation Needs: Not on file  Physical Activity: Not on file  Stress: Not on file  Social Connections: Not on file     Review of Systems  Constitutional:  Negative for appetite change and unexpected weight change.  HENT:  Positive for congestion and postnasal drip.        Throat congestion.   Respiratory:  Negative for cough, chest tightness and shortness of breath.   Cardiovascular:  Negative for chest pain, palpitations and leg swelling.  Gastrointestinal:  Negative for abdominal pain, diarrhea, nausea and vomiting.  Genitourinary:  Negative for dysuria and hematuria.  Musculoskeletal:  Positive for neck pain. Negative for joint swelling and myalgias.  Skin:  Negative for color change and rash.  Neurological:  Positive for dizziness, light-headedness and headaches.       Some mild intermittent headache.    Psychiatric/Behavioral:  Negative for agitation and dysphoric mood.       Objective:     BP 128/76   Pulse 66   Temp  97.8 F (36.6 C)   Resp 16   Ht 6' 4"  (1.93 m)   Wt 250 lb (113.4 kg)   SpO2 98%   BMI 30.43 kg/m  Wt Readings from Last 3 Encounters:  02/25/21 250 lb (113.4 kg)  02/11/21 252 lb (114.3 kg)  08/20/20 253 lb 9.6 oz (115 kg)    Physical Exam Constitutional:      General: He is not in acute distress.    Appearance: Normal appearance. He is well-developed.  HENT:     Head: Normocephalic and atraumatic.     Right Ear: External ear normal.     Left Ear: External ear normal.  Eyes:     General: No scleral icterus.       Right eye: No discharge.        Left eye: No discharge.  Cardiovascular:     Rate and Rhythm: Normal rate and regular rhythm.  Pulmonary:     Effort: Pulmonary effort is normal. No respiratory distress.     Breath sounds: Normal breath sounds.  Abdominal:     General: Bowel sounds are normal.     Palpations: Abdomen is soft.     Tenderness: There is no abdominal tenderness.  Musculoskeletal:        General: No swelling or tenderness.     Cervical back: Neck supple. No tenderness.  Lymphadenopathy:     Cervical: No cervical adenopathy.  Skin:    Findings: No erythema or rash.  Neurological:     Mental Status: He is alert.  Psychiatric:        Mood and Affect: Mood normal.        Behavior: Behavior normal.     Outpatient Encounter Medications as of 02/25/2021  Medication Sig   Alpha-Lipoic Acid 200 MG CAPS Take 600 mg by mouth daily.   amLODipine (NORVASC) 5 MG tablet Take 1 tablet (5 mg total) by mouth daily.   Cholecalciferol (VITAMIN D3) 125 MCG (5000 UT) CAPS Take 1 capsule by mouth daily.   MAGNESIUM OXIDE PO Take 400 mg by mouth daily.   omega-3 acid ethyl esters (LOVAZA) 1 G capsule Take by mouth 2 (two) times daily.   pantoprazole (PROTONIX) 40 MG tablet Take by mouth.   Probiotic Product (PROBIOTIC PO) Take by mouth daily.   silodosin (RAPAFLO) 8 MG CAPS capsule Take 1 capsule (8 mg total) by mouth daily with breakfast.   Turmeric 500 MG  CAPS Take 500 mg by mouth daily.    vitamin B-12 (CYANOCOBALAMIN) 100 MCG tablet Take 100 mcg by mouth daily.   No facility-administered encounter medications on file as of 02/25/2021.  Lab Results  Component Value Date   WBC 4.8 02/25/2021   HGB 14.7 02/25/2021   HCT 44.3 02/25/2021   PLT 141.0 (L) 02/25/2021   GLUCOSE 95 02/25/2021   CHOL 184 02/25/2021   TRIG 64.0 02/25/2021   HDL 47.70 02/25/2021   LDLCALC 124 (H) 02/25/2021   ALT 12 02/25/2021   AST 13 02/25/2021   NA 140 02/25/2021   K 4.6 02/25/2021   CL 104 02/25/2021   CREATININE 1.22 02/25/2021   BUN 18 02/25/2021   CO2 29 02/25/2021   TSH 2.97 08/20/2020   PSA 0.47 08/20/2020   HGBA1C 5.6 02/25/2021    Ultrasound renal complete  Result Date: 01/09/2020 CLINICAL DATA:  Angiomyolipoma of the kidney. EXAM: RENAL / URINARY TRACT ULTRASOUND COMPLETE COMPARISON:  Ultrasound nov 2020 FINDINGS: Right Kidney: Renal measurements: 13.3 x 5.1 x 6.1 cm = volume: 214 mL. Echogenicity within normal limits. No hydronephrosis. Approximately 2.1 cm cyst in the interpolar kidney, not substantially changed compared to prior ultrasound. Left Kidney: Renal measurements: 13.4 x 5.2 x 6.0 cm = volume: 218 mL. Echogenicity within normal limits. No mass or hydronephrosis visualized. Bladder: Appears normal for degree of bladder distention. Bilateral ureteral jets identified. Other: None. IMPRESSION: 1. No hydronephrosis. 2. Similar size/appearance of a right interpolar renal cyst. 3. No sonographic evidence of a left renal angiomyolipoma. Normal appearance of the left kidney. Electronically Signed   By: Margaretha Sheffield MD   On: 01/09/2020 15:57       Assessment & Plan:   Problem List Items Addressed This Visit     Benign essential HTN    Continue amlodipine.  Blood pressure checked by me:  130/78.  Follow pressures.  Follow metabolic panel.       GERD (gastroesophageal reflux disease)    Reports increased acid reflux as outlined.   Monitor food intake.  Avoid eating late.  Not taking protonix regularly.  Will start protonix daily - 30 minutes before evening meal.  Follow.  Get him back in soon to reassess.       Headache    Describes intermittent headache as outlined.  Treat congestion.  Check c-spine xray.  Discussed if persistent, further w/up.       Hyperglycemia    Low carb diet and exercise.  Follow met b and a1c.       Relevant Orders   Basic metabolic panel (Completed)   Hepatic function panel (Completed)   Hemoglobin A1c (Completed)   Hyperlipidemia    Low cholesterol diet and exercise.  Follow lipid panel.       Relevant Orders   Lipid panel (Completed)   Light headedness    Probably multifactorial.  Since changing to rapaflo, has improved some.   Neck is some better. Check s-spine xray.  Treat nasal congestion - saline flushes.  Continue cpap.  He also describes positional worsening.  States will notice when looks to left or right, light headed feeling - room moving sensation.  Given persistence, refer to ENT for evaluation.        Relevant Orders   Ambulatory referral to ENT   Neck pain    Neck pain as outlined.  Has seen chiropractor.  Helped.  Could be contributing to intermittent headache and light headedness.  Check c-spine xray.        Relevant Orders   DG Cervical Spine 2 or 3 views (Completed)   OSA (obstructive sleep apnea)    Continue cpap.  Wears regularly.  Discussed  contacting company to confirm if needs any new supplies, etc. Reports feeling rested in am.  Feels is benefiting him.       Renal cyst    Evaluated by urology.  Renal ultrasound 12/2019 - stable right renal cyst. Continues f/u with urology.        Throat congestion    Continue saline nasal spray.  Treat acid reflux - protonix daily.  Call with update.  Get him back in soon to reassess.        Thrombocytopenia (HCC)    Platelet count slightly decreased last check.  Recheck today to confirm stable.       Other  Visit Diagnoses     Decreased platelet count ([place])       Relevant Orders   CBC w/Diff (Completed)        Einar Pheasant, MD

## 2021-02-25 NOTE — Assessment & Plan Note (Signed)
Continue amlodipine.  Blood pressure checked by me:  130/78.  Follow pressures.  Follow metabolic panel.

## 2021-02-26 ENCOUNTER — Encounter: Payer: Self-pay | Admitting: Internal Medicine

## 2021-02-26 DIAGNOSIS — R42 Dizziness and giddiness: Secondary | ICD-10-CM | POA: Insufficient documentation

## 2021-02-26 DIAGNOSIS — R6889 Other general symptoms and signs: Secondary | ICD-10-CM | POA: Insufficient documentation

## 2021-02-26 NOTE — Assessment & Plan Note (Addendum)
Continue cpap.  Wears regularly.  Discussed contacting company to confirm if needs any new supplies, etc. Reports feeling rested in am.  Feels is benefiting him.

## 2021-02-26 NOTE — Assessment & Plan Note (Signed)
Describes intermittent headache as outlined.  Treat congestion.  Check c-spine xray.  Discussed if persistent, further w/up.

## 2021-02-26 NOTE — Assessment & Plan Note (Signed)
Neck pain as outlined.  Has seen chiropractor.  Helped.  Could be contributing to intermittent headache and light headedness.  Check c-spine xray.

## 2021-02-26 NOTE — Assessment & Plan Note (Signed)
Probably multifactorial.  Since changing to rapaflo, has improved some.   Neck is some better. Check s-spine xray.  Treat nasal congestion - saline flushes.  Continue cpap.  He also describes positional worsening.  States will notice when looks to left or right, light headed feeling - room moving sensation.  Given persistence, refer to ENT for evaluation.

## 2021-02-26 NOTE — Assessment & Plan Note (Signed)
Low carb diet and exercise.  Follow met b and a1c.  

## 2021-02-26 NOTE — Assessment & Plan Note (Signed)
Reports increased acid reflux as outlined.  Monitor food intake.  Avoid eating late.  Not taking protonix regularly.  Will start protonix daily - 30 minutes before evening meal.  Follow.  Get him back in soon to reassess.

## 2021-02-26 NOTE — Assessment & Plan Note (Signed)
Platelet count slightly decreased last check.  Recheck today to confirm stable.

## 2021-02-26 NOTE — Assessment & Plan Note (Signed)
Low cholesterol diet and exercise.  Follow lipid panel.   

## 2021-02-26 NOTE — Addendum Note (Signed)
Addended by: Alisa Graff on: 02/26/2021 05:14 AM   Modules accepted: Orders

## 2021-02-26 NOTE — Assessment & Plan Note (Signed)
Evaluated by urology.  Renal ultrasound 12/2019 - stable right renal cyst. Continues f/u with urology.

## 2021-02-26 NOTE — Assessment & Plan Note (Signed)
Continue saline nasal spray.  Treat acid reflux - protonix daily.  Call with update.  Get him back in soon to reassess.

## 2021-02-27 ENCOUNTER — Telehealth: Payer: Self-pay | Admitting: *Deleted

## 2021-02-27 NOTE — Telephone Encounter (Signed)
-----   Message from Einar Pheasant, MD sent at 02/26/2021  5:12 AM EST ----- Notify - neck xray reveals some "degenerative" changes - loss of disc height - mostly noted at C4-C5 and C5-C6.  No acute abnormality.  Also see lab result note.

## 2021-02-27 NOTE — Telephone Encounter (Signed)
LMTCB

## 2021-03-04 NOTE — Telephone Encounter (Signed)
Patient given x-ray and lab results. He voiced understanding of both.

## 2021-03-13 ENCOUNTER — Other Ambulatory Visit: Payer: Self-pay | Admitting: Internal Medicine

## 2021-03-13 MED ORDER — PANTOPRAZOLE SODIUM 40 MG PO TBEC
40.0000 mg | DELAYED_RELEASE_TABLET | Freq: Every day | ORAL | 1 refills | Status: DC
Start: 2021-03-13 — End: 2021-06-22

## 2021-04-29 ENCOUNTER — Other Ambulatory Visit: Payer: Self-pay

## 2021-04-29 ENCOUNTER — Encounter: Payer: Self-pay | Admitting: Internal Medicine

## 2021-04-29 ENCOUNTER — Ambulatory Visit: Payer: Managed Care, Other (non HMO) | Admitting: Internal Medicine

## 2021-04-29 DIAGNOSIS — G4733 Obstructive sleep apnea (adult) (pediatric): Secondary | ICD-10-CM | POA: Diagnosis not present

## 2021-04-29 DIAGNOSIS — N281 Cyst of kidney, acquired: Secondary | ICD-10-CM | POA: Diagnosis not present

## 2021-04-29 DIAGNOSIS — K219 Gastro-esophageal reflux disease without esophagitis: Secondary | ICD-10-CM

## 2021-04-29 DIAGNOSIS — I10 Essential (primary) hypertension: Secondary | ICD-10-CM | POA: Diagnosis not present

## 2021-04-29 DIAGNOSIS — D696 Thrombocytopenia, unspecified: Secondary | ICD-10-CM

## 2021-04-29 DIAGNOSIS — E785 Hyperlipidemia, unspecified: Secondary | ICD-10-CM

## 2021-04-29 DIAGNOSIS — R739 Hyperglycemia, unspecified: Secondary | ICD-10-CM

## 2021-04-29 DIAGNOSIS — R42 Dizziness and giddiness: Secondary | ICD-10-CM

## 2021-04-29 DIAGNOSIS — R519 Headache, unspecified: Secondary | ICD-10-CM

## 2021-04-29 NOTE — Assessment & Plan Note (Signed)
Dizziness/light headedness previously described.  Is better.  Saw ENT as outlined.  Testing negative.  Was referred to neurology.  Awaiting appt.  Follow.

## 2021-04-29 NOTE — Assessment & Plan Note (Signed)
Platelet count slightly decreased last check - stable.  Follow cbc.

## 2021-04-29 NOTE — Progress Notes (Signed)
Patient ID: Clifford Alexander, male   DOB: July 23, 1965, 56 y.o.   MRN: [id number]   Subjective:    Patient ID: Clifford Alexander, male    DOB: 1965-09-27, 56 y.o.   MRN: [id number]  This visit occurred during the SARS-CoV-2 public health emergency.  Safety protocols were in place, including screening questions prior to the visit, additional usage of staff PPE, and extensive cleaning of exam room while observing appropriate contact time as indicated for disinfecting solutions.   Patient here for a scheduled follow up.   Chief Complaint  Patient presents with   Hypertension   Gastroesophageal Reflux   .   Hypertension Pertinent negatives include no chest pain, palpitations or shortness of breath.  Gastroesophageal Reflux He reports no abdominal pain, no chest pain, no coughing or no nausea.  Last visit, was having issues with dizziness.  Was referred to ENT.  Evaluated.  Bilateral cerumen impaction.  Debrided.  Hall pike - negative.  VNG = per pt ok.  Referred him to neurology. Waiting for appt.  Dizziness is better. Was having issues with his neck.  Xray with DDD.  Still some neck pain intermittently.  Posterior head pain - intermittent.  Has a neck pillow - helps.  No chest pain or sob reported.  Started on protonix last visit for acid reflux.  Acid reflux controlled if takes protonix.  No chest pain or sob reported.  No abdominal pain. Bowels moving.  Off rapaflo. Started back on flomax.    Past Medical History:  Diagnosis Date   Bradycardia    CPAP (continuous positive airway pressure) dependence    Fatigue 06/23/2008   Hyperlipidemia    Hypertension    Insomnia 04/19/2007   Neuromuscular disorder (HCC)    neuropathy in feet   Shingles 01/2016   Sleep apnea    Thrombocytopenia (HCC)    Past Surgical History:  Procedure Laterality Date   LUMBAR LAMINECTOMY  04/22/2007   L4-L5   discectomy 03/2008   Family History  Problem Relation Age of Onset   Hyperthyroidism Mother    Kidney  disease Mother    Hypertension Father    Prostate cancer Paternal Grandfather    Social History   Socioeconomic History   Marital status: Married    Spouse name: Not on file   Number of children: Not on file   Years of education: Not on file   Highest education level: Not on file  Occupational History   Not on file  Tobacco Use   Smoking status: Former    Packs/day: 1.00    Years: 10.00    Pack years: 10.00    Types: Cigarettes    Quit date: 04/18/1992    Years since quitting: 29.0   Smokeless tobacco: Never   Tobacco comments:    quit in 1994  Vaping Use   Vaping Use: Never used  Substance and Sexual Activity   Alcohol use: No    Alcohol/week: 0.0 standard drinks   Drug use: No   Sexual activity: Yes  Other Topics Concern   Not on file  Social History Narrative   Not on file   Social Determinants of Health   Financial Resource Strain: Not on file  Food Insecurity: Not on file  Transportation Needs: Not on file  Physical Activity: Not on file  Stress: Not on file  Social Connections: Not on file     Review of Systems  Constitutional:  Negative for appetite change and unexpected weight change.  HENT:  Negative for congestion and sinus pressure.   Respiratory:  Negative for cough, chest tightness and shortness of breath.   Cardiovascular:  Negative for chest pain, palpitations and leg swelling.  Gastrointestinal:  Negative for abdominal pain, diarrhea, nausea and vomiting.  Genitourinary:  Negative for difficulty urinating and dysuria.  Musculoskeletal:  Negative for joint swelling and myalgias.  Skin:  Negative for color change and rash.  Neurological:        Dizziness is better.  No significant headache.    Psychiatric/Behavioral:  Negative for agitation and dysphoric mood.       Objective:     BP 132/78    Pulse 70    Temp 97.9 F (36.6 C)    Resp 16    Ht 6' 4" (1.93 m)    Wt 255 lb (115.7 kg)    SpO2 98%    BMI 31.04 kg/m  Wt Readings from Last 3  Encounters:  04/29/21 255 lb (115.7 kg)  02/25/21 250 lb (113.4 kg)  02/11/21 252 lb (114.3 kg)    Physical Exam Constitutional:      General: He is not in acute distress.    Appearance: Normal appearance. He is well-developed.  HENT:     Head: Normocephalic and atraumatic.     Right Ear: External ear normal.     Left Ear: External ear normal.  Eyes:     General: No scleral icterus.       Right eye: No discharge.        Left eye: No discharge.  Cardiovascular:     Rate and Rhythm: Normal rate and regular rhythm.  Pulmonary:     Effort: Pulmonary effort is normal. No respiratory distress.     Breath sounds: Normal breath sounds.  Abdominal:     General: Bowel sounds are normal.     Palpations: Abdomen is soft.     Tenderness: There is no abdominal tenderness.  Musculoskeletal:        General: No swelling or tenderness.     Cervical back: Neck supple. No tenderness.  Lymphadenopathy:     Cervical: No cervical adenopathy.  Skin:    Findings: No erythema or rash.  Neurological:     Mental Status: He is alert.  Psychiatric:        Mood and Affect: Mood normal.        Behavior: Behavior normal.     Outpatient Encounter Medications as of 04/29/2021  Medication Sig   Alpha-Lipoic Acid 200 MG CAPS Take 600 mg by mouth daily.   amLODipine (NORVASC) 5 MG tablet Take 1 tablet (5 mg total) by mouth daily.   Cholecalciferol (VITAMIN D3) 125 MCG (5000 UT) CAPS Take 1 capsule by mouth daily.   MAGNESIUM OXIDE PO Take 400 mg by mouth daily.   omega-3 acid ethyl esters (LOVAZA) 1 G capsule Take by mouth 2 (two) times daily.   pantoprazole (PROTONIX) 40 MG tablet Take 1 tablet (40 mg total) by mouth daily.   Probiotic Product (PROBIOTIC PO) Take by mouth daily.   silodosin (RAPAFLO) 8 MG CAPS capsule Take 1 capsule (8 mg total) by mouth daily with breakfast.   tamsulosin (FLOMAX) 0.4 MG CAPS capsule Take 0.4 mg by mouth daily.   Turmeric 500 MG CAPS Take 500 mg by mouth daily.     vitamin B-12 (CYANOCOBALAMIN) 100 MCG tablet Take 100 mcg by mouth daily.   No facility-administered encounter medications on file as of 04/29/2021.  Lab Results  Component Value Date   WBC 4.8 02/25/2021   HGB 14.7 02/25/2021   HCT 44.3 02/25/2021   PLT 141.0 (L) 02/25/2021   GLUCOSE 95 02/25/2021   CHOL 184 02/25/2021   TRIG 64.0 02/25/2021   HDL 47.70 02/25/2021   LDLCALC 124 (H) 02/25/2021   ALT 12 02/25/2021   AST 13 02/25/2021   NA 140 02/25/2021   K 4.6 02/25/2021   CL 104 02/25/2021   CREATININE 1.22 02/25/2021   BUN 18 02/25/2021   CO2 29 02/25/2021   TSH 2.97 08/20/2020   PSA 0.47 08/20/2020   HGBA1C 5.6 02/25/2021    Ultrasound renal complete  Result Date: 01/09/2020 CLINICAL DATA:  Angiomyolipoma of the kidney. EXAM: RENAL / URINARY TRACT ULTRASOUND COMPLETE COMPARISON:  Ultrasound nov 2020 FINDINGS: Right Kidney: Renal measurements: 13.3 x 5.1 x 6.1 cm = volume: 214 mL. Echogenicity within normal limits. No hydronephrosis. Approximately 2.1 cm cyst in the interpolar kidney, not substantially changed compared to prior ultrasound. Left Kidney: Renal measurements: 13.4 x 5.2 x 6.0 cm = volume: 218 mL. Echogenicity within normal limits. No mass or hydronephrosis visualized. Bladder: Appears normal for degree of bladder distention. Bilateral ureteral jets identified. Other: None. IMPRESSION: 1. No hydronephrosis. 2. Similar size/appearance of a right interpolar renal cyst. 3. No sonographic evidence of a left renal angiomyolipoma. Normal appearance of the left kidney. Electronically Signed   By: Margaretha Sheffield MD   On: 01/09/2020 15:57       Assessment & Plan:   Problem List Items Addressed This Visit     Benign essential HTN    Continue amlodipine.  Blood pressure checked by me:  128/78.  Checks at home <027 systolic.   Follow pressures.  Follow metabolic panel.       GERD (gastroesophageal reflux disease)    No acid reflux on protonix.  Continue for now.   Follow.       Headache    Occasional posterior head pain.  Feels is related to his neck.  Relieved with neck pillow at times.  No known triggers.  Will monitor for triggers.  appt with Dr Manuella Ghazi.  Continue cervical pillow.  Any change - evaluate.       Hyperglycemia    Low carb diet and exercise.  Follow met b and a1c.       Hyperlipidemia    Low cholesterol diet and exercise.  Follow lipid panel.       Light headedness    Dizziness/light headedness previously described.  Is better.  Saw ENT as outlined.  Testing negative.  Was referred to neurology.  Awaiting appt.  Follow.        OSA (obstructive sleep apnea)    CPAP.       Renal cyst    Evaluated by urology.  Renal ultrasound 12/2019 - stable right renal cyst. Continues f/u with urology.        Thrombocytopenia (HCC)    Platelet count slightly decreased last check - stable.  Follow cbc.         Einar Pheasant, MD

## 2021-04-29 NOTE — Assessment & Plan Note (Signed)
CPAP.  

## 2021-04-29 NOTE — Assessment & Plan Note (Signed)
No acid reflux on protonix.  Continue for now.  Follow.  

## 2021-04-29 NOTE — Assessment & Plan Note (Signed)
Occasional posterior head pain.  Feels is related to his neck.  Relieved with neck pillow at times.  No known triggers.  Will monitor for triggers.  appt with Dr Manuella Ghazi.  Continue cervical pillow.  Any change - evaluate.

## 2021-04-29 NOTE — Assessment & Plan Note (Signed)
Low carb diet and exercise.  Follow met b and a1c.

## 2021-04-29 NOTE — Assessment & Plan Note (Signed)
Low cholesterol diet and exercise.  Follow lipid panel.   

## 2021-04-29 NOTE — Assessment & Plan Note (Signed)
Evaluated by urology.  Renal ultrasound 12/2019 - stable right renal cyst. Continues f/u with urology.

## 2021-04-29 NOTE — Assessment & Plan Note (Signed)
Continue amlodipine.  Blood pressure checked by me:  128/78.  Checks at home <508 systolic.   Follow pressures.  Follow metabolic panel.

## 2021-05-14 ENCOUNTER — Encounter: Payer: Self-pay | Admitting: Internal Medicine

## 2021-05-14 NOTE — Telephone Encounter (Signed)
I'm not familiar with this. Are you ok with him taking?

## 2021-05-14 NOTE — Telephone Encounter (Signed)
Please notify him that supplements like these are not FDA regulated, so we do not have the data to know response, effects of the supplement.

## 2021-06-05 ENCOUNTER — Telehealth: Payer: Self-pay | Admitting: Internal Medicine

## 2021-06-05 DIAGNOSIS — N281 Cyst of kidney, acquired: Secondary | ICD-10-CM

## 2021-06-05 NOTE — Telephone Encounter (Signed)
Pt notified of Dr Doristine Counter response.   ?

## 2021-06-05 NOTE — Telephone Encounter (Signed)
-----   Message from Billey Co, MD sent at 06/04/2021  8:03 AM EST ----- ?Regarding: RE: question ?Simple renal cyst, no follow-up imaging needed, thanks ? ?Nickolas Madrid, MD ?06/04/2021 ? ? ?----- Message ----- ?From: Einar Pheasant, MD ?Sent: 06/01/2021   6:23 PM EST ?To: Billey Co, MD ?Subject: question                                      ? ?You previously saw Clifford Alexander for BPH, etc.  He had a stable renal cyst noted on an ultrasound 12/2019.  Does he need any follow up regarding the renal cyst?  If he needs a follow up appointment with you, I can get this arranged.  Thank you for helping take care of him.  I really appreciate it.   ? ?Thank you ?Smantha Boakye  ? ? ?

## 2021-06-05 NOTE — Assessment & Plan Note (Signed)
Per note (Dr Sninsky) - no f/u warranted.  (see phone note 06/05/21) 

## 2021-06-17 ENCOUNTER — Encounter: Payer: Self-pay | Admitting: Internal Medicine

## 2021-06-20 ENCOUNTER — Other Ambulatory Visit: Payer: Self-pay | Admitting: Neurology

## 2021-06-20 DIAGNOSIS — E569 Vitamin deficiency, unspecified: Secondary | ICD-10-CM

## 2021-06-20 DIAGNOSIS — R42 Dizziness and giddiness: Secondary | ICD-10-CM

## 2021-06-22 ENCOUNTER — Other Ambulatory Visit: Payer: Self-pay | Admitting: Internal Medicine

## 2021-07-01 ENCOUNTER — Ambulatory Visit: Payer: Managed Care, Other (non HMO)

## 2021-07-08 ENCOUNTER — Other Ambulatory Visit: Payer: Managed Care, Other (non HMO)

## 2021-07-11 ENCOUNTER — Telehealth: Payer: Self-pay | Admitting: *Deleted

## 2021-07-11 DIAGNOSIS — D696 Thrombocytopenia, unspecified: Secondary | ICD-10-CM

## 2021-07-11 DIAGNOSIS — I10 Essential (primary) hypertension: Secondary | ICD-10-CM

## 2021-07-11 DIAGNOSIS — E785 Hyperlipidemia, unspecified: Secondary | ICD-10-CM

## 2021-07-11 NOTE — Telephone Encounter (Signed)
Please place future orders for lab appt.  

## 2021-07-12 NOTE — Telephone Encounter (Signed)
Orders placed for labs

## 2021-07-15 ENCOUNTER — Other Ambulatory Visit (INDEPENDENT_AMBULATORY_CARE_PROVIDER_SITE_OTHER): Payer: Managed Care, Other (non HMO)

## 2021-07-15 DIAGNOSIS — E785 Hyperlipidemia, unspecified: Secondary | ICD-10-CM

## 2021-07-15 DIAGNOSIS — D696 Thrombocytopenia, unspecified: Secondary | ICD-10-CM

## 2021-07-15 DIAGNOSIS — I10 Essential (primary) hypertension: Secondary | ICD-10-CM

## 2021-07-15 LAB — BASIC METABOLIC PANEL
BUN: 13 mg/dL (ref 6–23)
CO2: 29 mEq/L (ref 19–32)
Calcium: 9.1 mg/dL (ref 8.4–10.5)
Chloride: 107 mEq/L (ref 96–112)
Creatinine, Ser: 1.2 mg/dL (ref 0.40–1.50)
GFR: 67.1 mL/min (ref >60.00)
Glucose, Bld: 93 mg/dL (ref 70–99)
Potassium: 3.9 mEq/L (ref 3.5–5.1)
Sodium: 141 mEq/L (ref 135–145)

## 2021-07-15 LAB — CBC WITH DIFFERENTIAL/PLATELET
Basophils Absolute: 0 10*3/uL (ref 0.0–0.1)
Basophils Relative: 0.6 % (ref 0.0–3.0)
Eosinophils Absolute: 0.1 10*3/uL (ref 0.0–0.7)
Eosinophils Relative: 1.2 % (ref 0.0–5.0)
HCT: 43.6 % (ref 39.0–52.0)
Hemoglobin: 14.6 g/dL (ref 13.0–17.0)
Lymphocytes Relative: 30.9 % (ref 12.0–46.0)
Lymphs Abs: 1.4 10*3/uL (ref 0.7–4.0)
MCHC: 33.6 g/dL (ref 30.0–36.0)
MCV: 94.4 fl (ref 78.0–100.0)
Monocytes Absolute: 0.4 10*3/uL (ref 0.1–1.0)
Monocytes Relative: 9.5 % (ref 3.0–12.0)
Neutro Abs: 2.5 10*3/uL (ref 1.4–7.7)
Neutrophils Relative %: 57.8 % (ref 43.0–77.0)
Platelets: 134 10*3/uL — ABNORMAL LOW (ref 150.0–400.0)
RBC: 4.62 Mil/uL (ref 4.22–5.81)
RDW: 13 % (ref 11.5–15.5)
WBC: 4.4 10*3/uL (ref 4.0–10.5)

## 2021-07-15 LAB — HEPATIC FUNCTION PANEL
ALT: 12 U/L (ref 0–53)
AST: 14 U/L (ref 0–37)
Albumin: 4.2 g/dL (ref 3.5–5.2)
Alkaline Phosphatase: 48 U/L (ref 39–117)
Bilirubin, Direct: 0.2 mg/dL (ref 0.0–0.3)
Total Bilirubin: 1.2 mg/dL (ref 0.2–1.2)
Total Protein: 6.3 g/dL (ref 6.0–8.3)

## 2021-07-15 LAB — LIPID PANEL
Cholesterol: 183 mg/dL (ref 0–200)
HDL: 43.1 mg/dL (ref >39.00)
LDL Cholesterol: 122 mg/dL — ABNORMAL HIGH (ref 0–99)
NonHDL: 140.37
Total CHOL/HDL Ratio: 4
Triglycerides: 90 mg/dL (ref 0.0–149.0)
VLDL: 18 mg/dL (ref 0.0–40.0)

## 2021-07-15 LAB — TSH: TSH: 1.67 u[IU]/mL (ref 0.35–5.50)

## 2021-07-18 ENCOUNTER — Encounter: Payer: Self-pay | Admitting: Internal Medicine

## 2021-07-29 ENCOUNTER — Ambulatory Visit: Payer: Managed Care, Other (non HMO) | Admitting: Internal Medicine

## 2021-09-02 ENCOUNTER — Ambulatory Visit: Payer: Managed Care, Other (non HMO) | Admitting: Internal Medicine

## 2021-09-09 ENCOUNTER — Ambulatory Visit: Payer: Managed Care, Other (non HMO) | Admitting: Internal Medicine

## 2021-09-30 ENCOUNTER — Ambulatory Visit: Payer: Managed Care, Other (non HMO) | Admitting: Internal Medicine

## 2021-09-30 ENCOUNTER — Encounter: Payer: Self-pay | Admitting: Internal Medicine

## 2021-09-30 ENCOUNTER — Ambulatory Visit (INDEPENDENT_AMBULATORY_CARE_PROVIDER_SITE_OTHER): Payer: Managed Care, Other (non HMO)

## 2021-09-30 VITALS — BP 122/78 | HR 70 | Temp 98.6°F | Resp 17 | Ht 76.0 in | Wt 255.6 lb

## 2021-09-30 DIAGNOSIS — M25562 Pain in left knee: Secondary | ICD-10-CM

## 2021-09-30 DIAGNOSIS — G4733 Obstructive sleep apnea (adult) (pediatric): Secondary | ICD-10-CM

## 2021-09-30 DIAGNOSIS — R739 Hyperglycemia, unspecified: Secondary | ICD-10-CM

## 2021-09-30 DIAGNOSIS — K219 Gastro-esophageal reflux disease without esophagitis: Secondary | ICD-10-CM | POA: Diagnosis not present

## 2021-09-30 DIAGNOSIS — Z125 Encounter for screening for malignant neoplasm of prostate: Secondary | ICD-10-CM

## 2021-09-30 DIAGNOSIS — D696 Thrombocytopenia, unspecified: Secondary | ICD-10-CM

## 2021-09-30 DIAGNOSIS — R519 Headache, unspecified: Secondary | ICD-10-CM

## 2021-09-30 DIAGNOSIS — I10 Essential (primary) hypertension: Secondary | ICD-10-CM | POA: Diagnosis not present

## 2021-09-30 DIAGNOSIS — E785 Hyperlipidemia, unspecified: Secondary | ICD-10-CM

## 2021-09-30 DIAGNOSIS — G5603 Carpal tunnel syndrome, bilateral upper limbs: Secondary | ICD-10-CM

## 2021-09-30 NOTE — Progress Notes (Unsigned)
Patient ID: Clifford Alexander, male   DOB: September 20, 1965, 56 y.o.   MRN: [id number]   Subjective:    Patient ID: Clifford Alexander, male    DOB: [birth year], 56 y.o.   MRN: [id number]  This visit occurred during the SARS-CoV-2 public health emergency.  Safety protocols were in place, including screening questions prior to the visit, additional usage of staff PPE, and extensive cleaning of exam room while observing appropriate contact time as indicated for disinfecting solutions.   Patient here for  Chief Complaint  Patient presents with   Hypertension   .   HPI    Past Medical History:  Diagnosis Date   Bradycardia    CPAP (continuous positive airway pressure) dependence    Fatigue 06/23/2008   Hyperlipidemia    Hypertension    Insomnia 04/19/2007   Neuromuscular disorder (HCC)    neuropathy in feet   Shingles 01/2016   Sleep apnea    Thrombocytopenia (HCC)    Past Surgical History:  Procedure Laterality Date   LUMBAR LAMINECTOMY  04/22/2007   L4-L5   discectomy 03/2008   Family History  Problem Relation Age of Onset   Hyperthyroidism Mother    Kidney disease Mother    Hypertension Father    Prostate cancer Paternal Grandfather    Social History   Socioeconomic History   Marital status: Married    Spouse name: Not on file   Number of children: Not on file   Years of education: Not on file   Highest education level: Not on file  Occupational History   Not on file  Tobacco Use   Smoking status: Former    Packs/day: 1.00    Years: 10.00    Total pack years: 10.00    Types: Cigarettes    Quit date: 04/18/1992    Years since quitting: 29.4   Smokeless tobacco: Never   Tobacco comments:    quit in 1994  Vaping Use   Vaping Use: Never used  Substance and Sexual Activity   Alcohol use: No    Alcohol/week: 0.0 standard drinks of alcohol   Drug use: No   Sexual activity: Yes  Other Topics Concern   Not on file  Social History Narrative   Not on file   Social  Determinants of Health   Financial Resource Strain: Not on file  Food Insecurity: Not on file  Transportation Needs: Not on file  Physical Activity: Not on file  Stress: Not on file  Social Connections: Not on file     Review of Systems     Objective:     BP 134/80 (BP Location: Left Arm, Patient Position: Sitting, Cuff Size: Large)   Pulse 70   Temp 98.6 F (37 C) (Temporal)   Resp 17   Ht '6\' 4"'$  (1.93 m)   Wt 255 lb 9.6 oz (115.9 kg)   SpO2 98%   BMI 31.11 kg/m  Wt Readings from Last 3 Encounters:  09/30/21 255 lb 9.6 oz (115.9 kg)  04/29/21 255 lb (115.7 kg)  02/25/21 250 lb (113.4 kg)    Physical Exam   Outpatient Encounter Medications as of 09/30/2021  Medication Sig   Alpha-Lipoic Acid 200 MG CAPS Take 600 mg by mouth daily.   amLODipine (NORVASC) 5 MG tablet Take 1 tablet (5 mg total) by mouth daily.   Cholecalciferol (VITAMIN D3) 125 MCG (5000 UT) CAPS Take 1 capsule by mouth daily.   MAGNESIUM OXIDE PO Take 400 mg by mouth  daily.   omega-3 acid ethyl esters (LOVAZA) 1 G capsule Take by mouth 2 (two) times daily.   pantoprazole (PROTONIX) 40 MG tablet Take 1 tablet by mouth once daily   Probiotic Product (PROBIOTIC PO) Take by mouth daily.   silodosin (RAPAFLO) 8 MG CAPS capsule Take 1 capsule (8 mg total) by mouth daily with breakfast.   tamsulosin (FLOMAX) 0.4 MG CAPS capsule Take 0.4 mg by mouth daily.   Turmeric 500 MG CAPS Take 500 mg by mouth daily.    vitamin B-12 (CYANOCOBALAMIN) 100 MCG tablet Take 100 mcg by mouth daily.   No facility-administered encounter medications on file as of 09/30/2021.     Lab Results  Component Value Date   WBC 4.4 07/15/2021   HGB 14.6 07/15/2021   HCT 43.6 07/15/2021   PLT 134.0 (L) 07/15/2021   GLUCOSE 93 07/15/2021   CHOL 183 07/15/2021   TRIG 90.0 07/15/2021   HDL 43.10 07/15/2021   LDLCALC 122 (H) 07/15/2021   ALT 12 07/15/2021   AST 14 07/15/2021   NA 141 07/15/2021   K 3.9 07/15/2021   CL 107  07/15/2021   CREATININE 1.20 07/15/2021   BUN 13 07/15/2021   CO2 29 07/15/2021   TSH 1.67 07/15/2021   PSA 0.47 08/20/2020   HGBA1C 5.6 02/25/2021    Ultrasound renal complete  Result Date: 01/09/2020 CLINICAL DATA:  Angiomyolipoma of the kidney. EXAM: RENAL / URINARY TRACT ULTRASOUND COMPLETE COMPARISON:  Ultrasound nov 2020 FINDINGS: Right Kidney: Renal measurements: 13.3 x 5.1 x 6.1 cm = volume: 214 mL. Echogenicity within normal limits. No hydronephrosis. Approximately 2.1 cm cyst in the interpolar kidney, not substantially changed compared to prior ultrasound. Left Kidney: Renal measurements: 13.4 x 5.2 x 6.0 cm = volume: 218 mL. Echogenicity within normal limits. No mass or hydronephrosis visualized. Bladder: Appears normal for degree of bladder distention. Bilateral ureteral jets identified. Other: None. IMPRESSION: 1. No hydronephrosis. 2. Similar size/appearance of a right interpolar renal cyst. 3. No sonographic evidence of a left renal angiomyolipoma. Normal appearance of the left kidney. Electronically Signed   By: Margaretha Sheffield MD   On: 01/09/2020 15:57       Assessment & Plan:   Problem List Items Addressed This Visit   None    Einar Pheasant, MD

## 2021-10-02 ENCOUNTER — Encounter: Payer: Self-pay | Admitting: Internal Medicine

## 2021-10-02 NOTE — Assessment & Plan Note (Signed)
Follow cbc.  

## 2021-10-02 NOTE — Assessment & Plan Note (Signed)
Low carb diet and exercise.  Follow met b and a1c.  

## 2021-10-02 NOTE — Assessment & Plan Note (Signed)
No acid reflux on protonix.  Continue for now.  Follow.  

## 2021-10-02 NOTE — Assessment & Plan Note (Signed)
Continue amlodipine.  Blood pressure checked by me:  122/78.  Follow pressures.  Follow metabolic panel.

## 2021-10-02 NOTE — Assessment & Plan Note (Signed)
Evaluated by neurology.  Had NCS.  Discussed wearing splint.  Follow.

## 2021-10-02 NOTE — Assessment & Plan Note (Signed)
Low cholesterol diet and exercise.  Follow lipid panel.   

## 2021-10-02 NOTE — Assessment & Plan Note (Signed)
Saw neurology.  No significant headache reported today.

## 2021-10-02 NOTE — Assessment & Plan Note (Signed)
Persistent intermittent knee pain as outlined. Check xray.  Further w/up pending results.

## 2021-10-02 NOTE — Assessment & Plan Note (Signed)
Continue cpap.  

## 2021-10-03 ENCOUNTER — Other Ambulatory Visit: Payer: Self-pay | Admitting: Internal Medicine

## 2021-10-03 DIAGNOSIS — M25562 Pain in left knee: Secondary | ICD-10-CM

## 2021-10-03 NOTE — Progress Notes (Signed)
Order placed for PT referral.  

## 2021-10-28 ENCOUNTER — Ambulatory Visit: Payer: Managed Care, Other (non HMO) | Admitting: Internal Medicine

## 2021-12-11 ENCOUNTER — Other Ambulatory Visit: Payer: Self-pay | Admitting: Internal Medicine

## 2021-12-11 DIAGNOSIS — I10 Essential (primary) hypertension: Secondary | ICD-10-CM

## 2021-12-30 ENCOUNTER — Other Ambulatory Visit (INDEPENDENT_AMBULATORY_CARE_PROVIDER_SITE_OTHER): Payer: Managed Care, Other (non HMO)

## 2021-12-30 DIAGNOSIS — Z125 Encounter for screening for malignant neoplasm of prostate: Secondary | ICD-10-CM | POA: Diagnosis not present

## 2021-12-30 DIAGNOSIS — E785 Hyperlipidemia, unspecified: Secondary | ICD-10-CM | POA: Diagnosis not present

## 2021-12-30 DIAGNOSIS — I10 Essential (primary) hypertension: Secondary | ICD-10-CM | POA: Diagnosis not present

## 2021-12-30 DIAGNOSIS — D696 Thrombocytopenia, unspecified: Secondary | ICD-10-CM

## 2021-12-30 DIAGNOSIS — R739 Hyperglycemia, unspecified: Secondary | ICD-10-CM | POA: Diagnosis not present

## 2021-12-30 LAB — HEPATIC FUNCTION PANEL
ALT: 12 U/L (ref 0–53)
AST: 14 U/L (ref 0–37)
Albumin: 4.4 g/dL (ref 3.5–5.2)
Alkaline Phosphatase: 60 U/L (ref 39–117)
Bilirubin, Direct: 0.2 mg/dL (ref 0.0–0.3)
Total Bilirubin: 1.3 mg/dL — ABNORMAL HIGH (ref 0.2–1.2)
Total Protein: 6.6 g/dL (ref 6.0–8.3)

## 2021-12-30 LAB — HEMOGLOBIN A1C: Hgb A1c MFr Bld: 5.7 % (ref 4.6–6.5)

## 2021-12-30 LAB — BASIC METABOLIC PANEL
BUN: 17 mg/dL (ref 6–23)
CO2: 27 mEq/L (ref 19–32)
Calcium: 9.4 mg/dL (ref 8.4–10.5)
Chloride: 105 mEq/L (ref 96–112)
Creatinine, Ser: 1.25 mg/dL (ref 0.40–1.50)
GFR: 63.68 mL/min (ref >60.00)
Glucose, Bld: 107 mg/dL — ABNORMAL HIGH (ref 70–99)
Potassium: 3.9 mEq/L (ref 3.5–5.1)
Sodium: 140 mEq/L (ref 135–145)

## 2021-12-30 LAB — CBC WITH DIFFERENTIAL/PLATELET
Basophils Absolute: 0 10*3/uL (ref 0.0–0.1)
Basophils Relative: 0.4 % (ref 0.0–3.0)
Eosinophils Absolute: 0.1 10*3/uL (ref 0.0–0.7)
Eosinophils Relative: 1.4 % (ref 0.0–5.0)
HCT: 42.8 % (ref 39.0–52.0)
Hemoglobin: 14.7 g/dL (ref 13.0–17.0)
Lymphocytes Relative: 31.1 % (ref 12.0–46.0)
Lymphs Abs: 1.3 10*3/uL (ref 0.7–4.0)
MCHC: 34.4 g/dL (ref 30.0–36.0)
MCV: 92.5 fl (ref 78.0–100.0)
Monocytes Absolute: 0.4 10*3/uL (ref 0.1–1.0)
Monocytes Relative: 9.8 % (ref 3.0–12.0)
Neutro Abs: 2.4 10*3/uL (ref 1.4–7.7)
Neutrophils Relative %: 57.3 % (ref 43.0–77.0)
Platelets: 133 10*3/uL — ABNORMAL LOW (ref 150.0–400.0)
RBC: 4.62 Mil/uL (ref 4.22–5.81)
RDW: 12.9 % (ref 11.5–15.5)
WBC: 4.2 10*3/uL (ref 4.0–10.5)

## 2021-12-30 LAB — LIPID PANEL
Cholesterol: 190 mg/dL (ref 0–200)
HDL: 45.9 mg/dL (ref >39.00)
LDL Cholesterol: 122 mg/dL — ABNORMAL HIGH (ref 0–99)
NonHDL: 144.14
Total CHOL/HDL Ratio: 4
Triglycerides: 109 mg/dL (ref 0.0–149.0)
VLDL: 21.8 mg/dL (ref 0.0–40.0)

## 2021-12-30 LAB — PSA: PSA: 0.45 ng/mL (ref 0.10–4.00)

## 2022-01-06 ENCOUNTER — Encounter: Payer: Managed Care, Other (non HMO) | Admitting: Internal Medicine

## 2022-01-27 ENCOUNTER — Ambulatory Visit (INDEPENDENT_AMBULATORY_CARE_PROVIDER_SITE_OTHER): Payer: Managed Care, Other (non HMO) | Admitting: Internal Medicine

## 2022-01-27 ENCOUNTER — Encounter: Payer: Self-pay | Admitting: Internal Medicine

## 2022-01-27 VITALS — BP 140/72 | HR 74 | Temp 98.4°F | Resp 17 | Ht 76.0 in | Wt 256.0 lb

## 2022-01-27 DIAGNOSIS — K219 Gastro-esophageal reflux disease without esophagitis: Secondary | ICD-10-CM | POA: Diagnosis not present

## 2022-01-27 DIAGNOSIS — R739 Hyperglycemia, unspecified: Secondary | ICD-10-CM

## 2022-01-27 DIAGNOSIS — H539 Unspecified visual disturbance: Secondary | ICD-10-CM

## 2022-01-27 DIAGNOSIS — M542 Cervicalgia: Secondary | ICD-10-CM

## 2022-01-27 DIAGNOSIS — G4733 Obstructive sleep apnea (adult) (pediatric): Secondary | ICD-10-CM

## 2022-01-27 DIAGNOSIS — N281 Cyst of kidney, acquired: Secondary | ICD-10-CM

## 2022-01-27 DIAGNOSIS — I10 Essential (primary) hypertension: Secondary | ICD-10-CM | POA: Diagnosis not present

## 2022-01-27 DIAGNOSIS — M25562 Pain in left knee: Secondary | ICD-10-CM

## 2022-01-27 DIAGNOSIS — E785 Hyperlipidemia, unspecified: Secondary | ICD-10-CM

## 2022-01-27 DIAGNOSIS — Z0000 Encounter for general adult medical examination without abnormal findings: Secondary | ICD-10-CM

## 2022-01-27 DIAGNOSIS — D696 Thrombocytopenia, unspecified: Secondary | ICD-10-CM

## 2022-01-27 NOTE — Progress Notes (Signed)
Patient ID: Clifford Alexander, male   DOB: 11/25/1965, 56 y.o.   MRN: [id number]   Subjective:    Patient ID: Clifford Alexander, male    DOB: 12/05/1965, 56 y.o.   MRN: [id number]    Patient here for  Chief Complaint  Patient presents with   Annual Exam    CPE    .   HPI Evaluated by neurology 06/03/21 - sensorimotor polyneuropathy plus bilateral CTS with history of lumbar disc disease s/p L4-5 discectomy in 2010.  Recommended alpha lipoic acid.  Was evaluated for dizziness with negative ENT w/up.  Not orthostatic.  VNG normal.  Dix Hallpike's results normal. MRI ordered - not performed.  Recommended magnesium. Reports for the last 3-4 months - some blurred vision - can't see signs as well.  Some floaters.  Yesterday - mild headache.  He has exercises - neck.  No chest pain.  Breathing stable.  No increased cough or congestion.  No abdominal pain or bowel change reported.  Left knee is better.     Past Medical History:  Diagnosis Date   Bradycardia    CPAP (continuous positive airway pressure) dependence    Fatigue 06/23/2008   Hyperlipidemia    Hypertension    Insomnia 04/19/2007   Neuromuscular disorder (HCC)    neuropathy in feet   Shingles 01/2016   Sleep apnea    Thrombocytopenia (HCC)    Past Surgical History:  Procedure Laterality Date   LUMBAR LAMINECTOMY  04/22/2007   L4-L5   discectomy 03/2008   Family History  Problem Relation Age of Onset   Hyperthyroidism Mother    Kidney disease Mother    Hypertension Father    Prostate cancer Paternal Grandfather    Social History   Socioeconomic History   Marital status: Married    Spouse name: Not on file   Number of children: Not on file   Years of education: Not on file   Highest education level: Not on file  Occupational History   Not on file  Tobacco Use   Smoking status: Former    Packs/day: 1.00    Years: 10.00    Total pack years: 10.00    Types: Cigarettes    Quit date: 04/18/1992    Years since quitting: 29.8    Smokeless tobacco: Never   Tobacco comments:    quit in 1994  Vaping Use   Vaping Use: Never used  Substance and Sexual Activity   Alcohol use: No    Alcohol/week: 0.0 standard drinks of alcohol   Drug use: No   Sexual activity: Yes  Other Topics Concern   Not on file  Social History Narrative   Not on file   Social Determinants of Health   Financial Resource Strain: Not on file  Food Insecurity: Not on file  Transportation Needs: Not on file  Physical Activity: Not on file  Stress: Not on file  Social Connections: Not on file     Review of Systems  Constitutional:  Negative for appetite change and unexpected weight change.  HENT:  Negative for congestion, sinus pressure and sore throat.   Eyes:  Negative for pain.       Some blurred vision as outlined.   Respiratory:  Negative for cough, chest tightness and shortness of breath.   Cardiovascular:  Negative for chest pain, palpitations and leg swelling.  Gastrointestinal:  Negative for abdominal pain, diarrhea, nausea and vomiting.  Genitourinary:  Negative for difficulty urinating and dysuria.  Musculoskeletal:  Negative for joint swelling and myalgias.  Skin:  Negative for color change and rash.  Neurological:  Negative for dizziness, light-headedness and headaches.  Hematological:  Negative for adenopathy. Does not bruise/bleed easily.  Psychiatric/Behavioral:  Negative for agitation and dysphoric mood.        Objective:     BP (!) 140/72 (BP Location: Right Arm, Patient Position: Sitting, Cuff Size: Large)   Pulse 74   Temp 98.4 F (36.9 C) (Temporal)   Resp 17   Ht _0  (1.93 m)   Wt 256 lb (116.1 kg)   SpO2 98%   BMI 31.16 kg/m  Wt Readings from Last 3 Encounters:  01/27/22 256 lb (116.1 kg)  09/30/21 255 lb 9.6 oz (115.9 kg)  04/29/21 255 lb (115.7 kg)    Physical Exam   Outpatient Encounter Medications as of 01/27/2022  Medication Sig   Alpha-Lipoic Acid 200 MG CAPS Take 600 mg by mouth  daily.   amLODipine (NORVASC) 5 MG tablet Take 1 tablet by mouth once daily   Cholecalciferol (VITAMIN D3) 125 MCG (5000 UT) CAPS Take 1 capsule by mouth daily.   MAGNESIUM OXIDE PO Take 400 mg by mouth daily.   omega-3 acid ethyl esters (LOVAZA) 1 G capsule Take by mouth 2 (two) times daily.   pantoprazole (PROTONIX) 40 MG tablet Take 1 tablet by mouth once daily   Probiotic Product (PROBIOTIC PO) Take by mouth daily.   silodosin (RAPAFLO) 8 MG CAPS capsule Take 1 capsule (8 mg total) by mouth daily with breakfast.   tamsulosin (FLOMAX) 0.4 MG CAPS capsule Take 0.4 mg by mouth daily.   Turmeric 500 MG CAPS Take 500 mg by mouth daily.    vitamin B-12 (CYANOCOBALAMIN) 100 MCG tablet Take 100 mcg by mouth daily.   No facility-administered encounter medications on file as of 01/27/2022.     Lab Results  Component Value Date   WBC 4.2 12/30/2021   HGB 14.7 12/30/2021   HCT 42.8 12/30/2021   PLT 133.0 (L) 12/30/2021   GLUCOSE 107 (H) 12/30/2021   CHOL 190 12/30/2021   TRIG 109.0 12/30/2021   HDL 45.90 12/30/2021   LDLCALC 122 (H) 12/30/2021   ALT 12 12/30/2021   AST 14 12/30/2021   NA 140 12/30/2021   K 3.9 12/30/2021   CL 105 12/30/2021   CREATININE 1.25 12/30/2021   BUN 17 12/30/2021   CO2 27 12/30/2021   TSH 1.67 07/15/2021   PSA 0.45 12/30/2021   HGBA1C 5.7 12/30/2021    Ultrasound renal complete  Result Date: 01/09/2020 CLINICAL DATA:  Angiomyolipoma of the kidney. EXAM: RENAL / URINARY TRACT ULTRASOUND COMPLETE COMPARISON:  Ultrasound nov 2020 FINDINGS: Right Kidney: Renal measurements: 13.3 x 5.1 x 6.1 cm = volume: 214 mL. Echogenicity within normal limits. No hydronephrosis. Approximately 2.1 cm cyst in the interpolar kidney, not substantially changed compared to prior ultrasound. Left Kidney: Renal measurements: 13.4 x 5.2 x 6.0 cm = volume: 218 mL. Echogenicity within normal limits. No mass or hydronephrosis visualized. Bladder: Appears normal for degree of bladder  distention. Bilateral ureteral jets identified. Other: None. IMPRESSION: 1. No hydronephrosis. 2. Similar size/appearance of a right interpolar renal cyst. 3. No sonographic evidence of a left renal angiomyolipoma. Normal appearance of the left kidney. Electronically Signed   By: Margaretha Sheffield MD   On: 01/09/2020 15:57       Assessment & Plan:   Problem List Items Addressed This Visit     Benign essential  HTN    Continue amlodipine.  Follow pressures.  Follow metabolic panel.       GERD (gastroesophageal reflux disease)    No acid reflux on protonix.  Continue for now.  Follow.       Health care maintenance    Physical today 01/27/22. Colonoscopy 09/2017.  Per pt, due f/u in 10 years.  PSA .45 12/30/21.       Hyperglycemia    Low carb diet and exercise.  Follow met b and a1c.       Hyperlipidemia    The 10-year ASCVD risk score (Arnett DK, et al., 2019) is: 10.1%   Values used to calculate the score:     Age: 22 years     Sex: Male     Is Non-Hispanic African American: No     Diabetic: No     Tobacco smoker: No     Systolic Blood Pressure: 660 mmHg     Is BP treated: Yes     HDL Cholesterol: 45.9 mg/dL     Total Cholesterol: 190 mg/dL Discussed calculated cholesterol risk.  Discussed recommendation to start cholesterol medication.  Wants to continue diet and exercise.  Follow lipid panel.       Knee pain, left    Improved.       Neck pain    Exercises.       OSA (obstructive sleep apnea)    CPAP      Renal cyst    Per note (Dr Diamantina [facility]) - no f/u warranted.  (see phone note 06/05/21)      Thrombocytopenia ([place])    Follow cbc.       Vision changes    Some blurred vision as outlined.  Eye exam.  Has seen neurology and ENT for w/up - light headedness.  MRI unrevealing.  Discussed - further w/up.  Continue risk factor modification.        Other Visit Diagnoses     Routine general medical examination at a health care facility    -  Primary         Einar Pheasant, MD

## 2022-01-27 NOTE — Assessment & Plan Note (Addendum)
Physical today 01/27/22. Colonoscopy 09/2017.  Per pt, due f/u in 10 years.  PSA .45 12/30/21.

## 2022-01-27 NOTE — Assessment & Plan Note (Addendum)
The 10-year ASCVD risk score (Arnett DK, et al., 2019) is: 10.1%   Values used to calculate the score:     Age: 56 years     Sex: Male     Is Non-Hispanic African American: No     Diabetic: No     Tobacco smoker: No     Systolic Blood Pressure: 347 mmHg     Is BP treated: Yes     HDL Cholesterol: 45.9 mg/dL     Total Cholesterol: 190 mg/dL Discussed calculated cholesterol risk.  Discussed recommendation to start cholesterol medication.  Wants to continue diet and exercise.  Follow lipid panel.

## 2022-02-02 ENCOUNTER — Encounter: Payer: Self-pay | Admitting: Internal Medicine

## 2022-02-02 DIAGNOSIS — H539 Unspecified visual disturbance: Secondary | ICD-10-CM | POA: Insufficient documentation

## 2022-02-02 NOTE — Assessment & Plan Note (Signed)
CPAP.  

## 2022-02-02 NOTE — Assessment & Plan Note (Signed)
Some blurred vision as outlined.  Eye exam.  Has seen neurology and ENT for w/up - light headedness.  MRI unrevealing.  Discussed - further w/up.  Continue risk factor modification.

## 2022-02-02 NOTE — Assessment & Plan Note (Signed)
Improved

## 2022-02-02 NOTE — Assessment & Plan Note (Signed)
Follow cbc.  

## 2022-02-02 NOTE — Assessment & Plan Note (Signed)
Exercises:

## 2022-02-02 NOTE — Assessment & Plan Note (Signed)
Continue amlodipine.  Follow pressures.  Follow metabolic panel.   

## 2022-02-02 NOTE — Assessment & Plan Note (Signed)
Low carb diet and exercise.  Follow met b and a1c.  

## 2022-02-02 NOTE — Assessment & Plan Note (Signed)
Per note (Dr Diamantina [facility]) - no f/u warranted.  (see phone note 06/05/21)

## 2022-02-02 NOTE — Assessment & Plan Note (Signed)
No acid reflux on protonix.  Continue for now.  Follow.

## 2022-02-06 ENCOUNTER — Other Ambulatory Visit: Payer: Self-pay | Admitting: Urology

## 2022-02-10 ENCOUNTER — Encounter: Payer: Self-pay | Admitting: Internal Medicine

## 2022-02-10 ENCOUNTER — Ambulatory Visit: Payer: Managed Care, Other (non HMO) | Admitting: Physician Assistant

## 2022-02-10 VITALS — BP 166/84 | HR 76 | Ht 76.0 in | Wt 260.0 lb

## 2022-02-10 DIAGNOSIS — R339 Retention of urine, unspecified: Secondary | ICD-10-CM

## 2022-02-10 DIAGNOSIS — N400 Enlarged prostate without lower urinary tract symptoms: Secondary | ICD-10-CM | POA: Insufficient documentation

## 2022-02-10 DIAGNOSIS — N401 Enlarged prostate with lower urinary tract symptoms: Secondary | ICD-10-CM

## 2022-02-10 LAB — BLADDER SCAN AMB NON-IMAGING

## 2022-02-10 MED ORDER — TAMSULOSIN HCL 0.4 MG PO CAPS: 0.4000 mg | ORAL_CAPSULE | Freq: Every day | ORAL | 11 refills | Status: DC

## 2022-02-10 NOTE — Progress Notes (Signed)
02/10/2022 3:06 PM   Clifford Alexander 01-Dec-1965 [id number]  CC: Chief Complaint  Patient presents with   Benign Prostatic Hypertrophy   HPI: Clifford Alexander is a 56 y.o. male with PMH BPH with incomplete bladder emptying on Flomax and OSA with nocturia on CPAP who presents today for annual follow-up.   Today he reports no changes in his urinary symptoms over the past year.  He tried silodosin after his last visit with me, but preferred Flomax and switched back to it.  He notices increased nocturia on the nights that he forgets to wear his CPAP.  He has no acute concerns today.  His PCP obtained a PSA on him on 12/30/2021, 0.45, stable.  She did not yet perform his annual DRE.  IPSS 13/mostly satisfied as below, previously 4/mostly satisfied.  PVR 241 mL.   IPSS     Row Name 02/10/22 1000         International Prostate Symptom Score   How often have you had the sensation of not emptying your bladder? Less than half the time     How often have you had to urinate less than every two hours? More than half the time     How often have you found you stopped and started again several times when you urinated? Less than 1 in 5 times     How often have you found it difficult to postpone urination? Less than half the time     How often have you had a weak urinary stream? Less than 1 in 5 times     How often have you had to strain to start urination? Less than 1 in 5 times     How many times did you typically get up at night to urinate? 2 Times     Total IPSS Score 13       Quality of Life due to urinary symptoms   If you were to spend the rest of your life with your urinary condition just the way it is now how would you feel about that? Mostly Satisfied               PMH: Past Medical History:  Diagnosis Date   Bradycardia    CPAP (continuous positive airway pressure) dependence    Fatigue 06/23/2008   Hyperlipidemia    Hypertension    Insomnia 04/19/2007   Neuromuscular  disorder (HCC)    neuropathy in feet   Shingles 01/2016   Sleep apnea    Thrombocytopenia [facility])     Surgical History: Past Surgical History:  Procedure Laterality Date   LUMBAR LAMINECTOMY  04/22/2007   L4-L5   discectomy 03/2008    Home Medications:  Allergies as of 02/10/2022       Reactions   Naproxen Shortness Of Breath   Ciprofloxacin    Ibuprofen         Medication List        Accurate as of February 10, 2022  3:06 PM. If you have any questions, ask your nurse or doctor.          Alpha-Lipoic Acid 200 MG Caps Take 600 mg by mouth daily.   amLODipine 5 MG tablet Commonly known as: NORVASC Take 1 tablet by mouth once daily   cyanocobalamin 100 MCG tablet Commonly known as: VITAMIN B12 Take 100 mcg by mouth daily.   MAGNESIUM OXIDE PO Take 400 mg by mouth daily.   omega-3 acid ethyl esters 1 g  capsule Commonly known as: LOVAZA Take by mouth 2 (two) times daily.   pantoprazole 40 MG tablet Commonly known as: PROTONIX Take 1 tablet by mouth once daily   PROBIOTIC PO Take by mouth daily.   silodosin 8 MG Caps capsule Commonly known as: RAPAFLO Take 1 capsule (8 mg total) by mouth daily with breakfast.   tamsulosin 0.4 MG Caps capsule Commonly known as: FLOMAX Take 0.4 mg by mouth daily.   Turmeric 500 MG Caps Take 500 mg by mouth daily.   Vitamin D3 125 MCG (5000 UT) Caps Take 1 capsule by mouth daily.        Allergies:  Allergies  Allergen Reactions   Naproxen Shortness Of Breath   Ciprofloxacin    Ibuprofen     Family History: Family History  Problem Relation Age of Onset   Hyperthyroidism Mother    Kidney disease Mother    Hypertension Father    Prostate cancer Paternal Grandfather     Social History:   reports that he quit smoking about 29 years ago. His smoking use included cigarettes. He has a 10.00 pack-year smoking history. He has never used smokeless tobacco. He reports that he does not drink alcohol and does not  use drugs.  Physical Exam: BP (!) 166/84   Pulse 76   Ht '6\' 4"'$  (1.93 m)   Wt 260 lb (117.9 kg)   BMI 31.65 kg/m   Constitutional:  Alert and oriented, no acute distress, nontoxic appearing HEENT: [place], AT Cardiovascular: No clubbing, cyanosis, or edema Respiratory: Normal respiratory effort, no increased work of breathing Skin: No rashes, bruises or suspicious lesions Neurologic: Grossly intact, no focal deficits, moving all 4 extremities Psychiatric: Normal mood and affect  Laboratory Data: Results for orders placed or performed in visit on 02/10/22  Bladder Scan (Post Void Residual) in office  Result Value Ref Range   Scan Result 275m    Assessment & Plan:   1. Benign prostatic hyperplasia with incomplete bladder emptying He prefers Flomax over Rapaflo.  IPSS and PVR increased over prior, though PSA is stable.  He declined DRE today and said he will have his PCP perform this at his next visit.  We discussed consideration of possible outlet procedures including UroLift, given his incomplete bladder emptying and his relatively young age.  He wishes to defer this at this time.  We will continue to monitor. - Bladder Scan (Post Void Residual) in office - tamsulosin (FLOMAX) 0.4 MG CAPS capsule; Take 1 capsule (0.4 mg total) by mouth daily.  Dispense: 30 capsule; Refill: 11   Return in about 1 year (around 02/11/2023) for Annual IPSS/PVR.  SDebroah Loop PA-C  [facility] [address] SMineral Point[place] [phone]

## 2022-02-11 ENCOUNTER — Ambulatory Visit: Payer: Managed Care, Other (non HMO) | Admitting: Physician Assistant

## 2022-03-08 ENCOUNTER — Other Ambulatory Visit: Payer: Self-pay | Admitting: Internal Medicine

## 2022-03-08 DIAGNOSIS — I10 Essential (primary) hypertension: Secondary | ICD-10-CM

## 2022-04-05 ENCOUNTER — Other Ambulatory Visit: Payer: Self-pay | Admitting: Internal Medicine

## 2022-04-05 DIAGNOSIS — I10 Essential (primary) hypertension: Secondary | ICD-10-CM

## 2022-04-27 ENCOUNTER — Ambulatory Visit
Admission: EM | Admit: 2022-04-27 | Discharge: 2022-04-27 | Disposition: A | Discharge status: Home / Self Care | Payer: Managed Care, Other (non HMO) | Attending: Emergency Medicine | Admitting: Emergency Medicine

## 2022-04-27 DIAGNOSIS — R051 Acute cough: Secondary | ICD-10-CM | POA: Diagnosis not present

## 2022-04-27 DIAGNOSIS — J069 Acute upper respiratory infection, unspecified: Secondary | ICD-10-CM | POA: Diagnosis not present

## 2022-04-27 DIAGNOSIS — R0981 Nasal congestion: Secondary | ICD-10-CM | POA: Diagnosis not present

## 2022-04-27 MED ORDER — BENZONATATE 100 MG PO CAPS: 100.0000 mg | ORAL_CAPSULE | Freq: Three times a day (TID) | ORAL | 0 refills | Status: DC | PRN

## 2022-04-27 MED ORDER — PREDNISONE 10 MG (21) PO TBPK: ORAL_TABLET | Freq: Every day | ORAL | 0 refills | Status: DC

## 2022-04-27 NOTE — ED Provider Notes (Signed)
Roderic Palau    CSN: [id number] Arrival date & time: 04/27/22  1020      History   Chief Complaint Chief Complaint  Patient presents with   Nasal Congestion   Cough   Headache    HPI MATTHEWJAMES PETRASEK is a 57 y.o. male.  Patient presents with 3 day history of congestion and cough.  No fever, sore throat, shortness of breath, or other symptoms.  No OTC medications taken today.  His medical history includes hypertension, dysmetabolic syndrome, obstructive sleep apnea, polyneuropathy.   The history is provided by the patient and medical records.    Past Medical History:  Diagnosis Date   Bradycardia    CPAP (continuous positive airway pressure) dependence    Fatigue 06/23/2008   Hyperlipidemia    Hypertension    Insomnia 04/19/2007   Neuromuscular disorder (HCC)    neuropathy in feet   Shingles 01/2016   Sleep apnea    Thrombocytopenia ([place])     Patient Active Problem List   Diagnosis Date Noted   BPH (benign prostatic hyperplasia) 02/10/2022   Vision changes 02/02/2022   Knee pain, left 09/30/2021   Light headedness 02/26/2021   Throat congestion 02/26/2021   Neck pain 02/25/2021   Hernia, inguinal 02/26/2020   Thrombocytopenia ([place]) 10/09/2018   Renal cyst 10/09/2018   OSA (obstructive sleep apnea) 09/26/2017   Headache 09/26/2017   Polyneuropathy 07/18/2017   Abdominal pain 07/13/2017   CTS (carpal tunnel syndrome) 02/05/2017   Numbness in feet 10/27/2016   Vitamin D deficiency 10/27/2016   Health care maintenance 11/19/2015   Acute bilateral low back pain without sciatica 07/18/2015   Sinusitis, acute maxillary 07/18/2015   History of hay fever 10/23/2014   GERD (gastroesophageal reflux disease) 10/23/2014   Cephalalgia 63/84/6659   Dysmetabolic syndrome 93/57/0177   Hyperglycemia 10/23/2014   Hyperlipidemia 10/23/2014   External thrombosed hemorrhoids 04/06/2008   Combined fat and carbohydrate induced hyperlipemia 02/14/2008   Cannot sleep  04/19/2007   Benign essential HTN 08/05/2006    Past Surgical History:  Procedure Laterality Date   LUMBAR LAMINECTOMY  04/22/2007   L4-L5   discectomy 03/2008       Home Medications    Prior to Admission medications   Medication Sig Start Date End Date Taking? Authorizing Provider  benzonatate (TESSALON) 100 MG capsule Take 1 capsule (100 mg total) by mouth 3 (three) times daily as needed for cough. 04/27/22  Yes Sharion Balloon, NP  predniSONE (STERAPRED UNI-PAK 21 TAB) 10 MG (21) TBPK tablet Take by mouth daily. As directed 04/27/22  Yes Sharion Balloon, NP  Alpha-Lipoic Acid 200 MG CAPS Take 600 mg by mouth daily.    [provider]  amLODipine (NORVASC) 5 MG tablet Take 1 tablet by mouth once daily 04/07/22   Einar Pheasant, MD  Cholecalciferol (VITAMIN D3) 125 MCG (5000 UT) CAPS Take 1 capsule by mouth daily.    [provider]  MAGNESIUM OXIDE PO Take 400 mg by mouth daily.    [provider]  omega-3 acid ethyl esters (LOVAZA) 1 G capsule Take by mouth 2 (two) times daily.    [provider]  pantoprazole (PROTONIX) 40 MG tablet Take 1 tablet by mouth once daily 06/22/21   Dutch Quint B, FNP  Probiotic Product (PROBIOTIC PO) Take by mouth daily.    [provider]  tamsulosin (FLOMAX) 0.4 MG CAPS capsule Take 1 capsule (0.4 mg total) by mouth daily. 02/10/22   Vaillancourt,  Samantha, PA-C  Turmeric 500 MG CAPS Take 500 mg by mouth daily.     [provider]  vitamin B-12 (CYANOCOBALAMIN) 100 MCG tablet Take 100 mcg by mouth daily.    [provider]    Family History Family History  Problem Relation Age of Onset   Hyperthyroidism Mother    Kidney disease Mother    Hypertension Father    Prostate cancer Paternal Grandfather     Social History Social History   Tobacco Use   Smoking status: Former    Packs/day: 1.00    Years: 10.00    Total pack years: 10.00    Types: Cigarettes    Quit date: 04/18/1992     Years since quitting: 30.0   Smokeless tobacco: Never   Tobacco comments:    quit in 1994  Vaping Use   Vaping Use: Never used  Substance Use Topics   Alcohol use: No    Alcohol/week: 0.0 standard drinks of alcohol   Drug use: No     Allergies   Naproxen, Ciprofloxacin, and Ibuprofen   Review of Systems Review of Systems  Constitutional:  Negative for chills and fever.  HENT:  Positive for congestion. Negative for ear pain and sore throat.   Respiratory:  Positive for cough. Negative for shortness of breath.   Cardiovascular:  Negative for chest pain and palpitations.  Gastrointestinal:  Negative for diarrhea and vomiting.  Skin:  Negative for rash.  Neurological:  Positive for headaches.  All other systems reviewed and are negative.    Physical Exam Triage Vital Signs ED Triage Vitals  Enc Vitals Group     BP 04/27/22 1110 (!) 149/69     Pulse Rate 04/27/22 1048 83     Resp 04/27/22 1048 18     Temp 04/27/22 1048 97.8 F (36.6 C)     Temp src --      SpO2 04/27/22 1048 96 %     Weight --      Height --      Head Circumference --      Peak Flow --      Pain Score 04/27/22 1107 5     Pain Loc --      Pain Edu? --      Excl. in [place]? --    No data found.  Updated Vital Signs BP (!) 149/69   Pulse 83   Temp 97.8 F (36.6 C)   Resp 18   SpO2 96%   Visual Acuity Right Eye Distance:   Left Eye Distance:   Bilateral Distance:    Right Eye Near:   Left Eye Near:    Bilateral Near:     Physical Exam Vitals and nursing note reviewed.  Constitutional:      General: He is not in acute distress.    Appearance: Normal appearance. He is well-developed. He is not ill-appearing.  HENT:     Right Ear: Tympanic membrane normal.     Left Ear: Tympanic membrane normal.     Nose: Nose normal.     Mouth/Throat:     Mouth: Mucous membranes are moist.     Pharynx: Oropharynx is clear.  Cardiovascular:     Rate and Rhythm: Normal rate and regular rhythm.      Heart sounds: Normal heart sounds.  Pulmonary:     Effort: Pulmonary effort is normal. No respiratory distress.     Breath sounds: Normal breath sounds.  Musculoskeletal:  Cervical back: Neck supple.  Skin:    General: Skin is warm and dry.  Neurological:     Mental Status: He is alert.  Psychiatric:        Mood and Affect: Mood normal.        Behavior: Behavior normal.      UC Treatments / Results  Labs (all labs ordered are listed, but only abnormal results are displayed) Labs Reviewed - No data to display  EKG   Radiology No results found.  Procedures Procedures (including critical care time)  Medications Ordered in UC Medications - No data to display  Initial Impression / Assessment and Plan / UC Course  I have reviewed the triage vital signs and the nursing notes.  Pertinent labs & imaging results that were available during my care of the patient were reviewed by me and considered in my medical decision making (see chart for details).    Viral URI, nasal congestion, cough.  Patient declines COVID test.  Treating today with prednisone taper and Tessalon Perles.  Discussed other symptomatic treatment including Tylenol, rest, hydration.  Instructed patient to follow up with his PCP if symptoms are not improving.  He agrees to plan of care.   Final Clinical Impressions(s) / UC Diagnoses   Final diagnoses:  Viral URI  Nasal congestion  Acute cough     Discharge Instructions      Take the prednisone and Tessalon Perles as directed.  Follow up with your primary care provider if your symptoms are not improving.        ED Prescriptions     Medication Sig Dispense Auth. Provider   benzonatate (TESSALON) 100 MG capsule Take 1 capsule (100 mg total) by mouth 3 (three) times daily as needed for cough. 21 capsule Sharion Balloon, NP   predniSONE (STERAPRED UNI-PAK 21 TAB) 10 MG (21) TBPK tablet Take by mouth daily. As directed 21 tablet Sharion Balloon, NP       PDMP not reviewed this encounter.   Sharion Balloon, NP 04/27/22 1131

## 2022-04-27 NOTE — ED Triage Notes (Signed)
Patient to Urgent Care with complaints of head congestion, nasal congestion, productive cough, and headache. Also complaints of bilateral ear fullness and popping.   Symptoms started three days ago. Denies any known fevers.   Has been taking tylenol.

## 2022-04-27 NOTE — Discharge Instructions (Addendum)
Take the prednisone and Tessalon Perles as directed.  Follow up with your primary care provider if your symptoms are not improving.

## 2022-05-12 ENCOUNTER — Other Ambulatory Visit: Payer: Managed Care, Other (non HMO)

## 2022-05-22 ENCOUNTER — Other Ambulatory Visit: Payer: Self-pay

## 2022-05-22 DIAGNOSIS — I10 Essential (primary) hypertension: Secondary | ICD-10-CM

## 2022-05-22 DIAGNOSIS — E785 Hyperlipidemia, unspecified: Secondary | ICD-10-CM

## 2022-05-22 DIAGNOSIS — R739 Hyperglycemia, unspecified: Secondary | ICD-10-CM

## 2022-05-26 ENCOUNTER — Other Ambulatory Visit (INDEPENDENT_AMBULATORY_CARE_PROVIDER_SITE_OTHER): Payer: Managed Care, Other (non HMO)

## 2022-05-26 ENCOUNTER — Ambulatory Visit: Payer: Managed Care, Other (non HMO) | Admitting: Internal Medicine

## 2022-05-26 DIAGNOSIS — R739 Hyperglycemia, unspecified: Secondary | ICD-10-CM

## 2022-05-26 DIAGNOSIS — I10 Essential (primary) hypertension: Secondary | ICD-10-CM | POA: Diagnosis not present

## 2022-05-26 DIAGNOSIS — E785 Hyperlipidemia, unspecified: Secondary | ICD-10-CM | POA: Diagnosis not present

## 2022-05-26 LAB — HEMOGLOBIN A1C: Hgb A1c MFr Bld: 5.6 % (ref 4.6–6.5)

## 2022-05-26 LAB — LIPID PANEL
Cholesterol: 203 mg/dL — ABNORMAL HIGH (ref 0–200)
HDL: 42.9 mg/dL (ref >39.00)
LDL Cholesterol: 141 mg/dL — ABNORMAL HIGH (ref 0–99)
NonHDL: 160.37
Total CHOL/HDL Ratio: 5
Triglycerides: 96 mg/dL (ref 0.0–149.0)
VLDL: 19.2 mg/dL (ref 0.0–40.0)

## 2022-05-26 LAB — BASIC METABOLIC PANEL
BUN: 15 mg/dL (ref 6–23)
CO2: 30 mEq/L (ref 19–32)
Calcium: 9.7 mg/dL (ref 8.4–10.5)
Chloride: 106 mEq/L (ref 96–112)
Creatinine, Ser: 1.21 mg/dL (ref 0.40–1.50)
GFR: 66.03 mL/min (ref >60.00)
Glucose, Bld: 100 mg/dL — ABNORMAL HIGH (ref 70–99)
Potassium: 4.8 mEq/L (ref 3.5–5.1)
Sodium: 141 mEq/L (ref 135–145)

## 2022-05-26 LAB — HEPATIC FUNCTION PANEL
ALT: 14 U/L (ref 0–53)
AST: 15 U/L (ref 0–37)
Albumin: 4.1 g/dL (ref 3.5–5.2)
Alkaline Phosphatase: 59 U/L (ref 39–117)
Bilirubin, Direct: 0.1 mg/dL (ref 0.0–0.3)
Total Bilirubin: 0.6 mg/dL (ref 0.2–1.2)
Total Protein: 6.4 g/dL (ref 6.0–8.3)

## 2022-05-27 ENCOUNTER — Other Ambulatory Visit: Payer: Self-pay

## 2022-05-27 DIAGNOSIS — E785 Hyperlipidemia, unspecified: Secondary | ICD-10-CM

## 2022-05-27 MED ORDER — ROSUVASTATIN CALCIUM 10 MG PO TABS: 10.0000 mg | ORAL_TABLET | Freq: Every day | ORAL | 3 refills | Status: DC

## 2022-06-09 ENCOUNTER — Ambulatory Visit: Payer: Managed Care, Other (non HMO) | Admitting: Internal Medicine

## 2022-06-09 ENCOUNTER — Encounter: Payer: Self-pay | Admitting: Internal Medicine

## 2022-06-09 VITALS — BP 122/76 | HR 77 | Temp 98.3°F | Resp 16 | Ht 76.0 in | Wt 256.0 lb

## 2022-06-09 DIAGNOSIS — I10 Essential (primary) hypertension: Secondary | ICD-10-CM | POA: Diagnosis not present

## 2022-06-09 DIAGNOSIS — D696 Thrombocytopenia, unspecified: Secondary | ICD-10-CM

## 2022-06-09 DIAGNOSIS — R739 Hyperglycemia, unspecified: Secondary | ICD-10-CM

## 2022-06-09 DIAGNOSIS — E785 Hyperlipidemia, unspecified: Secondary | ICD-10-CM

## 2022-06-09 DIAGNOSIS — G5603 Carpal tunnel syndrome, bilateral upper limbs: Secondary | ICD-10-CM

## 2022-06-09 DIAGNOSIS — N281 Cyst of kidney, acquired: Secondary | ICD-10-CM

## 2022-06-09 DIAGNOSIS — N400 Enlarged prostate without lower urinary tract symptoms: Secondary | ICD-10-CM | POA: Diagnosis not present

## 2022-06-09 DIAGNOSIS — G4733 Obstructive sleep apnea (adult) (pediatric): Secondary | ICD-10-CM

## 2022-06-09 DIAGNOSIS — K219 Gastro-esophageal reflux disease without esophagitis: Secondary | ICD-10-CM

## 2022-06-09 DIAGNOSIS — B349 Viral infection, unspecified: Secondary | ICD-10-CM

## 2022-06-09 NOTE — Patient Instructions (Signed)
Continue saline flushes.   If needed - nasacort nasal spray - 2 sprays each nostril one time per day.  Do this in the evening.    Examples of cholesterol medications that are not statins: Zetia (oral medication)   Repatha - injectable medication.

## 2022-06-09 NOTE — Progress Notes (Signed)
Subjective:    Patient ID: Clifford Alexander, male    DOB: [birth year], 57 y.o.   MRN: [id number]  Patient here for  Chief Complaint  Patient presents with   Medical Management of Chronic Issues    HPI Here to follow up regarding hypertension, hyperglycemia and hypercholesterolemia.  Reports he is doing relatively well.  Did notice - starting 6 days ago some body aches.  Temp 99.  Some headache.  Sore throat yesterday.  Some congestion.  Feeling better today.  No increased chest congestion or cough.  No chest pain or sob.  No vomiting or diarrhea.  Has been seeing neurology - MRI - 03/2022 - no acute abnormality - per note review. No increased dizziness reported today.  Sees urology for f/u BPH.  On flomax.  PVR increased when compared to previous.  Elected to monitor.  Recommended DRE at our office.  Fungus - toe nail.  Has seen podiatry previously.  Discussed treatment options.  Notify if desires referral back.   Past Medical History:  Diagnosis Date   Bradycardia    CPAP (continuous positive airway pressure) dependence    Fatigue 06/23/2008   Hyperlipidemia    Hypertension    Insomnia 04/19/2007   Neuromuscular disorder (HCC)    neuropathy in feet   Shingles 01/2016   Sleep apnea    Thrombocytopenia (HCC)    Past Surgical History:  Procedure Laterality Date   LUMBAR LAMINECTOMY  04/22/2007   L4-L5   discectomy 03/2008   Family History  Problem Relation Age of Onset   Hyperthyroidism Mother    Kidney disease Mother    Hypertension Father    Prostate cancer Paternal Grandfather    Social History   Socioeconomic History   Marital status: Married    Spouse name: Not on file   Number of children: Not on file   Years of education: Not on file   Highest education level: Not on file  Occupational History   Not on file  Tobacco Use   Smoking status: Former    Packs/day: 1.00    Years: 10.00    Additional pack years: 0.00    Total pack years: 10.00    Types: Cigarettes     Quit date: 04/18/1992    Years since quitting: 30.1   Smokeless tobacco: Never   Tobacco comments:    quit in 1994  Vaping Use   Vaping Use: Never used  Substance and Sexual Activity   Alcohol use: No    Alcohol/week: 0.0 standard drinks of alcohol   Drug use: No   Sexual activity: Yes  Other Topics Concern   Not on file  Social History Narrative   Not on file   Social Determinants of Health   Financial Resource Strain: Not on file  Food Insecurity: Not on file  Transportation Needs: Not on file  Physical Activity: Not on file  Stress: Not on file  Social Connections: Not on file     Review of Systems  Constitutional:  Negative for appetite change and unexpected weight change.  HENT:  Negative for sinus pressure.        Some sore throat and headache with 99 temp as outlined.  Feeling better.   Respiratory:  Negative for cough, chest tightness and shortness of breath.   Cardiovascular:  Negative for chest pain and palpitations.  Gastrointestinal:  Negative for abdominal pain, diarrhea, nausea and vomiting.  Genitourinary:  Negative for difficulty urinating and dysuria.  Musculoskeletal:  Negative for joint swelling and myalgias.  Skin:  Negative for color change and rash.  Neurological:  Negative for dizziness.  Psychiatric/Behavioral:  Negative for agitation and dysphoric mood.        Objective:     BP 122/76   Pulse 77   Temp 98.3 F (36.8 C)   Resp 16   Ht 6\' 4"  (1.93 m)   Wt 256 lb (116.1 kg)   SpO2 98%   BMI 31.16 kg/m  Wt Readings from Last 3 Encounters:  06/09/22 256 lb (116.1 kg)  02/10/22 260 lb (117.9 kg)  01/27/22 256 lb (116.1 kg)    Physical Exam Vitals reviewed.  Constitutional:      General: He is not in acute distress.    Appearance: Normal appearance. He is well-developed.  HENT:     Head: Normocephalic and atraumatic.     Right Ear: External ear normal.     Left Ear: External ear normal.  Eyes:     General: No scleral icterus.        Right eye: No discharge.        Left eye: No discharge.     Conjunctiva/sclera: Conjunctivae normal.  Cardiovascular:     Rate and Rhythm: Normal rate and regular rhythm.  Pulmonary:     Effort: Pulmonary effort is normal. No respiratory distress.     Breath sounds: Normal breath sounds.  Abdominal:     General: Bowel sounds are normal.     Palpations: Abdomen is soft.     Tenderness: There is no abdominal tenderness.  Genitourinary:    Comments: Rectal exam - enlarged prostate.  Not distinct nodule palpated.  Musculoskeletal:        General: No swelling or tenderness.     Cervical back: Neck supple. No tenderness.  Lymphadenopathy:     Cervical: No cervical adenopathy.  Skin:    Findings: No erythema or rash.  Neurological:     Mental Status: He is alert.  Psychiatric:        Mood and Affect: Mood normal.        Behavior: Behavior normal.      Outpatient Encounter Medications as of 06/09/2022  Medication Sig   rosuvastatin (CRESTOR) 10 MG tablet Take 1 tablet (10 mg total) by mouth daily.   Alpha-Lipoic Acid 200 MG CAPS Take 600 mg by mouth daily.   amLODipine (NORVASC) 5 MG tablet Take 1 tablet by mouth once daily   Cholecalciferol (VITAMIN D3) 125 MCG (5000 UT) CAPS Take 1 capsule by mouth daily.   MAGNESIUM OXIDE PO Take 400 mg by mouth daily.   omega-3 acid ethyl esters (LOVAZA) 1 G capsule Take by mouth 2 (two) times daily.   pantoprazole (PROTONIX) 40 MG tablet Take 1 tablet by mouth once daily   Probiotic Product (PROBIOTIC PO) Take by mouth daily.   tamsulosin (FLOMAX) 0.4 MG CAPS capsule Take 1 capsule (0.4 mg total) by mouth daily.   Turmeric 500 MG CAPS Take 500 mg by mouth daily.    vitamin B-12 (CYANOCOBALAMIN) 100 MCG tablet Take 100 mcg by mouth daily.   [DISCONTINUED] benzonatate (TESSALON) 100 MG capsule Take 1 capsule (100 mg total) by mouth 3 (three) times daily as needed for cough.   [DISCONTINUED] predniSONE (STERAPRED UNI-PAK 21 TAB) 10 MG (21)  TBPK tablet Take by mouth daily. As directed   No facility-administered encounter medications on file as of 06/09/2022.     Lab Results  Component Value Date  WBC 4.2 12/30/2021   HGB 14.7 12/30/2021   HCT 42.8 12/30/2021   PLT 133.0 (L) 12/30/2021   GLUCOSE 100 (H) 05/26/2022   CHOL 203 (H) 05/26/2022   TRIG 96.0 05/26/2022   HDL 42.90 05/26/2022   LDLCALC 141 (H) 05/26/2022   ALT 14 05/26/2022   AST 15 05/26/2022   NA 141 05/26/2022   K 4.8 05/26/2022   CL 106 05/26/2022   CREATININE 1.21 05/26/2022   BUN 15 05/26/2022   CO2 30 05/26/2022   TSH 1.67 07/15/2021   PSA 0.45 12/30/2021   HGBA1C 5.6 05/26/2022    No results found.     Assessment & Plan:  Hyperlipidemia, unspecified hyperlipidemia type Assessment & Plan: The 10-year ASCVD risk score (Arnett DK, et al., 2019) is: 8.9%   Values used to calculate the score:     Age: 14 years     Sex: Male     Is Non-Hispanic African American: No     Diabetic: No     Tobacco smoker: No     Systolic Blood Pressure: 123XX123 mmHg     Is BP treated: Yes     HDL Cholesterol: 42.9 mg/dL     Total Cholesterol: 203 mg/dL Discussed calculated cholesterol risk.  Discussed recommendation to start cholesterol medication.  Wants to continue diet and exercise.  Follow lipid panel.  Prefers not to take a statin medication.  Discussed zetia, repatha and praluent.    Orders: -     Lipid panel; Future -     Hepatic function panel; Future  Hyperglycemia Assessment & Plan: Low carb diet and exercise.  Follow met b and a1c.   Orders: -     Hemoglobin A1c; Future  Benign essential HTN Assessment & Plan: Continue amlodipine.  Follow pressures.  Follow metabolic panel.   Orders: -     Basic metabolic panel; Future  Benign prostatic hyperplasia, unspecified whether lower urinary tract symptoms present Assessment & Plan: Urology 01/2022 - flomax and bladder scan. DRE today as outlined.    Bilateral carpal tunnel syndrome Assessment  & Plan: Evaluated by neurology.  Had NCS.  Splints.  Follow.     Gastroesophageal reflux disease without esophagitis Assessment & Plan: No acid reflux on protonix. Follow.    OSA (obstructive sleep apnea) Assessment & Plan: CPAP   Renal cyst Assessment & Plan: Per note (Dr Diamantina [place]) - no f/u warranted.  (see phone note 06/05/21)   Thrombocytopenia ([place]) Assessment & Plan: Follow cbc.    Viral illness Assessment & Plan: Symptoms as outlined.  Started 6 days ago.  Feeling better.  Saline nasal spray.  Follow.  Call with update.       Einar Pheasant, MD

## 2022-06-15 ENCOUNTER — Encounter: Payer: Self-pay | Admitting: Internal Medicine

## 2022-06-15 DIAGNOSIS — B349 Viral infection, unspecified: Secondary | ICD-10-CM | POA: Insufficient documentation

## 2022-06-15 NOTE — Assessment & Plan Note (Signed)
The 10-year ASCVD risk score (Arnett DK, et al., 2019) is: 8.9%   Values used to calculate the score:     Age: 57 years     Sex: Male     Is Non-Hispanic African American: No     Diabetic: No     Tobacco smoker: No     Systolic Blood Pressure: 123XX123 mmHg     Is BP treated: Yes     HDL Cholesterol: 42.9 mg/dL     Total Cholesterol: 203 mg/dL Discussed calculated cholesterol risk.  Discussed recommendation to start cholesterol medication.  Wants to continue diet and exercise.  Follow lipid panel.  Prefers not to take a statin medication.  Discussed zetia, repatha and praluent.

## 2022-06-15 NOTE — Assessment & Plan Note (Signed)
Low carb diet and exercise.  Follow met b and a1c.   

## 2022-06-15 NOTE — Assessment & Plan Note (Signed)
Symptoms as outlined.  Started 6 days ago.  Feeling better.  Saline nasal spray.  Follow.  Call with update.

## 2022-06-15 NOTE — Assessment & Plan Note (Signed)
Per note (Dr Sninsky) - no f/u warranted.  (see phone note 06/05/21) ?

## 2022-06-15 NOTE — Assessment & Plan Note (Signed)
Continue amlodipine.  Follow pressures.  Follow metabolic panel.   

## 2022-06-15 NOTE — Assessment & Plan Note (Signed)
Follow cbc.  

## 2022-06-15 NOTE — Assessment & Plan Note (Addendum)
No acid reflux on protonix.  Follow.  

## 2022-06-15 NOTE — Assessment & Plan Note (Signed)
CPAP.  

## 2022-06-15 NOTE — Assessment & Plan Note (Signed)
Urology 01/2022 - flomax and bladder scan. DRE today as outlined.

## 2022-06-15 NOTE — Assessment & Plan Note (Signed)
Evaluated by neurology.  Had NCS.  Splints.  Follow.

## 2022-07-07 ENCOUNTER — Other Ambulatory Visit: Payer: Managed Care, Other (non HMO)

## 2022-09-29 ENCOUNTER — Other Ambulatory Visit: Payer: Managed Care, Other (non HMO)

## 2022-10-06 ENCOUNTER — Other Ambulatory Visit (INDEPENDENT_AMBULATORY_CARE_PROVIDER_SITE_OTHER): Payer: Managed Care, Other (non HMO)

## 2022-10-06 DIAGNOSIS — I10 Essential (primary) hypertension: Secondary | ICD-10-CM | POA: Diagnosis not present

## 2022-10-06 DIAGNOSIS — R739 Hyperglycemia, unspecified: Secondary | ICD-10-CM | POA: Diagnosis not present

## 2022-10-06 DIAGNOSIS — E785 Hyperlipidemia, unspecified: Secondary | ICD-10-CM | POA: Diagnosis not present

## 2022-10-06 LAB — BASIC METABOLIC PANEL
BUN: 15 mg/dL (ref 6–23)
CO2: 30 mEq/L (ref 19–32)
Calcium: 9.6 mg/dL (ref 8.4–10.5)
Chloride: 104 mEq/L (ref 96–112)
Creatinine, Ser: 1.22 mg/dL (ref 0.40–1.50)
GFR: 65.22 mL/min (ref >60.00)
Glucose, Bld: 92 mg/dL (ref 70–99)
Potassium: 4.4 mEq/L (ref 3.5–5.1)
Sodium: 141 mEq/L (ref 135–145)

## 2022-10-06 LAB — HEPATIC FUNCTION PANEL
ALT: 11 U/L (ref 0–53)
AST: 13 U/L (ref 0–37)
Albumin: 4.2 g/dL (ref 3.5–5.2)
Alkaline Phosphatase: 50 U/L (ref 39–117)
Bilirubin, Direct: 0.2 mg/dL (ref 0.0–0.3)
Total Bilirubin: 1 mg/dL (ref 0.2–1.2)
Total Protein: 6.4 g/dL (ref 6.0–8.3)

## 2022-10-06 LAB — LIPID PANEL
Cholesterol: 185 mg/dL (ref 0–200)
HDL: 43.2 mg/dL (ref >39.00)
LDL Cholesterol: 123 mg/dL — ABNORMAL HIGH (ref 0–99)
NonHDL: 141.96
Total CHOL/HDL Ratio: 4
Triglycerides: 97 mg/dL (ref 0.0–149.0)
VLDL: 19.4 mg/dL (ref 0.0–40.0)

## 2022-10-06 LAB — HEMOGLOBIN A1C: Hgb A1c MFr Bld: 5.4 % (ref 4.6–6.5)

## 2022-10-07 ENCOUNTER — Telehealth: Payer: Self-pay

## 2022-10-07 NOTE — Telephone Encounter (Signed)
-----   Message from Dale Eastman, MD sent at 10/07/2022  5:09 AM EDT ----- Notify - cholesterol improved from last check.  Given calculated cholesterol risk, it is still recommended to start a cholesterol medication.  He has declined previously.  Can let me know if he changes his mind. Overall sugar control is wnl. Kidney function stable.  Liver function tests wnl.

## 2022-10-13 ENCOUNTER — Ambulatory Visit: Payer: Managed Care, Other (non HMO) | Admitting: Internal Medicine

## 2022-10-16 ENCOUNTER — Other Ambulatory Visit: Payer: Managed Care, Other (non HMO)

## 2022-10-19 NOTE — Progress Notes (Signed)
Subjective:    Patient ID: Clifford Alexander, male    DOB: July 05, 1965, 57 y.o.   MRN: [id number]  Patient here for  Chief Complaint  Patient presents with   Medical Management of Chronic Issues    HPI Here to follow up regarding hypertension, hyperglycemia and hypercholesterolemia. Has been seeing neurology - MRI - 03/2022 - no acute abnormality - per note review. No increased dizziness reported today. Sees urology for f/u BPH. On flomax. PVR increased when compared to previous. Elected to monitor.  Saw Dr Sherryll Burger today.  Persistent dizziness and neck issues.  Trial of neck exercises - to see if improves symptoms.  Also wearing compression hose. No chest pain or sob reported.  No abdominal pain or bowel change reported.  Discussed calcium score.    Past Medical History:  Diagnosis Date   Bradycardia    CPAP (continuous positive airway pressure) dependence    Fatigue 06/23/2008   Hyperlipidemia    Hypertension    Insomnia 04/19/2007   Neuromuscular disorder (HCC)    neuropathy in feet   Shingles 01/2016   Sleep apnea    Thrombocytopenia (HCC)    Past Surgical History:  Procedure Laterality Date   LUMBAR LAMINECTOMY  04/22/2007   L4-L5   discectomy 03/2008   Family History  Problem Relation Age of Onset   Hyperthyroidism Mother    Kidney disease Mother    Hypertension Father    Prostate cancer Paternal Grandfather    Social History   Socioeconomic History   Marital status: Married    Spouse name: Not on file   Number of children: Not on file   Years of education: Not on file   Highest education level: Not on file  Occupational History   Not on file  Tobacco Use   Smoking status: Former    Current packs/day: 0.00    Average packs/day: 1 pack/day for 10.0 years (10.0 ttl pk-yrs)    Types: Cigarettes    Start date: 04/18/1982    Quit date: 04/18/1992    Years since quitting: 30.5   Smokeless tobacco: Never   Tobacco comments:    quit in 1994  Vaping Use   Vaping  status: Never Used  Substance and Sexual Activity   Alcohol use: No    Alcohol/week: 0.0 standard drinks of alcohol   Drug use: No   Sexual activity: Yes  Other Topics Concern   Not on file  Social History Narrative   Not on file   Social Determinants of Health   Financial Resource Strain: Not on file  Food Insecurity: Not on file  Transportation Needs: Not on file  Physical Activity: Not on file  Stress: Not on file  Social Connections: Not on file     Review of Systems  Constitutional:  Negative for appetite change and unexpected weight change.  HENT:  Negative for congestion and sinus pressure.   Respiratory:  Negative for cough, chest tightness and shortness of breath.   Cardiovascular:  Negative for chest pain, palpitations and leg swelling.  Gastrointestinal:  Negative for abdominal pain, diarrhea, nausea and vomiting.  Genitourinary:  Negative for difficulty urinating and dysuria.  Musculoskeletal:  Positive for neck pain. Negative for joint swelling and myalgias.  Skin:  Negative for color change and rash.  Neurological:  Positive for dizziness. Negative for headaches.  Psychiatric/Behavioral:  Negative for agitation and dysphoric mood.        Objective:     BP 122/74  Pulse 68   Temp 97.9 F (36.6 C)   Resp 16   Ht 6\' 4"  (1.93 m)   Wt 252 lb (114.3 kg)   SpO2 98%   BMI 30.67 kg/m  Wt Readings from Last 3 Encounters:  10/20/22 252 lb (114.3 kg)  06/09/22 256 lb (116.1 kg)  02/10/22 260 lb (117.9 kg)    Physical Exam Vitals reviewed.  Constitutional:      General: He is not in acute distress.    Appearance: Normal appearance. He is well-developed.  HENT:     Head: Normocephalic and atraumatic.     Right Ear: External ear normal.     Left Ear: External ear normal.  Eyes:     General: No scleral icterus.       Right eye: No discharge.        Left eye: No discharge.     Conjunctiva/sclera: Conjunctivae normal.  Cardiovascular:     Rate and  Rhythm: Normal rate and regular rhythm.  Pulmonary:     Effort: Pulmonary effort is normal. No respiratory distress.     Breath sounds: Normal breath sounds.  Abdominal:     General: Bowel sounds are normal.     Palpations: Abdomen is soft.     Tenderness: There is no abdominal tenderness.  Musculoskeletal:        General: No swelling or tenderness.     Cervical back: Neck supple. No tenderness.  Lymphadenopathy:     Cervical: No cervical adenopathy.  Skin:    Findings: No erythema or rash.  Neurological:     Mental Status: He is alert.  Psychiatric:        Mood and Affect: Mood normal.        Behavior: Behavior normal.      Outpatient Encounter Medications as of 10/20/2022  Medication Sig   rosuvastatin (CRESTOR) 10 MG tablet Take 1 tablet (10 mg total) by mouth daily.   Alpha-Lipoic Acid 200 MG CAPS Take 600 mg by mouth daily.   amLODipine (NORVASC) 5 MG tablet Take 1 tablet by mouth once daily   Cholecalciferol (VITAMIN D3) 125 MCG (5000 UT) CAPS Take 1 capsule by mouth daily.   MAGNESIUM OXIDE PO Take 400 mg by mouth daily.   omega-3 acid ethyl esters (LOVAZA) 1 G capsule Take by mouth 2 (two) times daily.   pantoprazole (PROTONIX) 40 MG tablet Take 1 tablet by mouth once daily   Probiotic Product (PROBIOTIC PO) Take by mouth daily.   tamsulosin (FLOMAX) 0.4 MG CAPS capsule Take 1 capsule (0.4 mg total) by mouth daily.   Turmeric 500 MG CAPS Take 500 mg by mouth daily.    vitamin B-12 (CYANOCOBALAMIN) 100 MCG tablet Take 100 mcg by mouth daily.   No facility-administered encounter medications on file as of 10/20/2022.     Lab Results  Component Value Date   WBC 4.2 12/30/2021   HGB 14.7 12/30/2021   HCT 42.8 12/30/2021   PLT 133.0 (L) 12/30/2021   GLUCOSE 92 10/06/2022   CHOL 185 10/06/2022   TRIG 97.0 10/06/2022   HDL 43.20 10/06/2022   LDLCALC 123 (H) 10/06/2022   ALT 11 10/06/2022   AST 13 10/06/2022   NA 141 10/06/2022   K 4.4 10/06/2022   CL 104  10/06/2022   CREATININE 1.22 10/06/2022   BUN 15 10/06/2022   CO2 30 10/06/2022   TSH 1.67 07/15/2021   PSA 0.45 12/30/2021   HGBA1C 5.4 10/06/2022    No  results found.     Assessment & Plan:  Hyperlipidemia, unspecified hyperlipidemia type Assessment & Plan: The 10-year ASCVD risk score (Arnett DK, et al., 2019) is: 8.1%   Values used to calculate the score:     Age: 73 years     Sex: Male     Is Non-Hispanic African American: No     Diabetic: No     Tobacco smoker: No     Systolic Blood Pressure: 122 mmHg     Is BP treated: Yes     HDL Cholesterol: 43.2 mg/dL     Total Cholesterol: 185 mg/dL  Discussed diet and exercise.  Discussed calculated cholesterol risk.  Discussed calcium score.  Agreeable.  Follow lipid panel.    Encounter for screening for coronary artery disease -     CT CARDIAC SCORING (SELF PAY ONLY); Future  Benign essential HTN Assessment & Plan: Continue amlodipine.  Follow pressures.  Follow metabolic panel.    Benign prostatic hyperplasia, unspecified whether lower urinary tract symptoms present Assessment & Plan: Sees urology for f/u BPH. On flomax. PVR increased when compared to previous. Elected to monitor.    Gastroesophageal reflux disease without esophagitis Assessment & Plan: No acid reflux on protonix. Follow.    Hyperglycemia Assessment & Plan: Low carb diet and exercise.  Follow met b and a1c.    Neck pain Assessment & Plan: Saw Dr Sherryll Burger.  Trial of neck exercises.  Follow.    OSA (obstructive sleep apnea) Assessment & Plan: CPAP   Thrombocytopenia (HCC) Assessment & Plan: Follow cbc.       Dale Avery, MD

## 2022-10-20 ENCOUNTER — Ambulatory Visit: Payer: Managed Care, Other (non HMO) | Admitting: Internal Medicine

## 2022-10-20 ENCOUNTER — Encounter: Payer: Self-pay | Admitting: Internal Medicine

## 2022-10-20 VITALS — BP 122/74 | HR 68 | Temp 97.9°F | Resp 16 | Ht 76.0 in | Wt 252.0 lb

## 2022-10-20 DIAGNOSIS — R739 Hyperglycemia, unspecified: Secondary | ICD-10-CM

## 2022-10-20 DIAGNOSIS — N400 Enlarged prostate without lower urinary tract symptoms: Secondary | ICD-10-CM | POA: Diagnosis not present

## 2022-10-20 DIAGNOSIS — I10 Essential (primary) hypertension: Secondary | ICD-10-CM

## 2022-10-20 DIAGNOSIS — E785 Hyperlipidemia, unspecified: Secondary | ICD-10-CM | POA: Diagnosis not present

## 2022-10-20 DIAGNOSIS — Z136 Encounter for screening for cardiovascular disorders: Secondary | ICD-10-CM

## 2022-10-20 DIAGNOSIS — D696 Thrombocytopenia, unspecified: Secondary | ICD-10-CM

## 2022-10-20 DIAGNOSIS — G4733 Obstructive sleep apnea (adult) (pediatric): Secondary | ICD-10-CM

## 2022-10-20 DIAGNOSIS — M542 Cervicalgia: Secondary | ICD-10-CM

## 2022-10-20 DIAGNOSIS — K219 Gastro-esophageal reflux disease without esophagitis: Secondary | ICD-10-CM

## 2022-10-20 NOTE — Assessment & Plan Note (Addendum)
The 10-year ASCVD risk score (Arnett DK, et al., 2019) is: 8.1%   Values used to calculate the score:     Age: 57 years     Sex: Male     Is Non-Hispanic African American: No     Diabetic: No     Tobacco smoker: No     Systolic Blood Pressure: 122 mmHg     Is BP treated: Yes     HDL Cholesterol: 43.2 mg/dL     Total Cholesterol: 185 mg/dL  Discussed diet and exercise.  Discussed calculated cholesterol risk.  Discussed calcium score.  Agreeable.  Follow lipid panel.

## 2022-10-25 ENCOUNTER — Encounter: Payer: Self-pay | Admitting: Internal Medicine

## 2022-10-25 NOTE — Assessment & Plan Note (Signed)
Saw Dr Sherryll Burger.  Trial of neck exercises.  Follow.

## 2022-10-25 NOTE — Assessment & Plan Note (Signed)
Continue amlodipine.  Follow pressures.  Follow metabolic panel.   

## 2022-10-25 NOTE — Assessment & Plan Note (Signed)
Low carb diet and exercise.  Follow met b and a1c.   

## 2022-10-25 NOTE — Assessment & Plan Note (Signed)
CPAP.  

## 2022-10-25 NOTE — Assessment & Plan Note (Signed)
Sees urology for f/u BPH. On flomax. PVR increased when compared to previous. Elected to monitor.

## 2022-10-25 NOTE — Assessment & Plan Note (Signed)
No acid reflux on protonix.  Follow.  

## 2022-10-25 NOTE — Assessment & Plan Note (Signed)
Follow cbc.  

## 2022-11-03 ENCOUNTER — Ambulatory Visit
Admission: RE | Admit: 2022-11-03 | Discharge: 2022-11-03 | Disposition: A | Discharge status: Home / Self Care | Payer: Managed Care, Other (non HMO) | Source: Ambulatory Visit | Attending: Internal Medicine | Admitting: Internal Medicine

## 2022-11-03 DIAGNOSIS — Z136 Encounter for screening for cardiovascular disorders: Secondary | ICD-10-CM | POA: Insufficient documentation

## 2022-11-04 ENCOUNTER — Telehealth: Payer: Self-pay | Admitting: *Deleted

## 2022-11-04 NOTE — Telephone Encounter (Signed)
Left voicemail to return call to discuss results below.

## 2022-11-04 NOTE — Telephone Encounter (Signed)
-----   Message from [place] sent at 11/03/2022 11:01 PM EDT ----- Notify - calcium score - is 146.  It is recommended for him to take cholesterol medication.  Need to confirm if taking crestor.  If no, then start 10mg  q day.  If taking 10mg  q day, would like to increase to 20mg  q day.  Lung portion pending at this time.  Will notify once receive.

## 2022-11-10 NOTE — Telephone Encounter (Signed)
Patient called and Dr Roby Lofts note was read. Patient states he is not taking Crestor. Patient states he is willing to start taking Crestor.

## 2022-11-11 ENCOUNTER — Other Ambulatory Visit: Payer: Self-pay

## 2022-11-11 MED ORDER — ROSUVASTATIN CALCIUM 10 MG PO TABS: 10.0000 mg | ORAL_TABLET | Freq: Every day | ORAL | 3 refills | Status: DC

## 2022-11-11 NOTE — Telephone Encounter (Signed)
See result note.  

## 2022-11-25 ENCOUNTER — Other Ambulatory Visit: Payer: Self-pay | Admitting: Internal Medicine

## 2022-11-25 DIAGNOSIS — I10 Essential (primary) hypertension: Secondary | ICD-10-CM

## 2022-11-28 ENCOUNTER — Other Ambulatory Visit: Payer: Self-pay

## 2022-11-28 DIAGNOSIS — E785 Hyperlipidemia, unspecified: Secondary | ICD-10-CM

## 2022-12-29 ENCOUNTER — Other Ambulatory Visit (INDEPENDENT_AMBULATORY_CARE_PROVIDER_SITE_OTHER): Payer: Managed Care, Other (non HMO)

## 2022-12-29 DIAGNOSIS — E785 Hyperlipidemia, unspecified: Secondary | ICD-10-CM

## 2022-12-29 LAB — HEPATIC FUNCTION PANEL
ALT: 17 U/L (ref 0–53)
AST: 18 U/L (ref 0–37)
Albumin: 4.1 g/dL (ref 3.5–5.2)
Alkaline Phosphatase: 49 U/L (ref 39–117)
Bilirubin, Direct: 0.2 mg/dL (ref 0.0–0.3)
Total Bilirubin: 1 mg/dL (ref 0.2–1.2)
Total Protein: 6 g/dL (ref 6.0–8.3)

## 2023-02-05 ENCOUNTER — Telehealth: Payer: Self-pay | Admitting: Internal Medicine

## 2023-02-05 DIAGNOSIS — D696 Thrombocytopenia, unspecified: Secondary | ICD-10-CM

## 2023-02-05 DIAGNOSIS — E785 Hyperlipidemia, unspecified: Secondary | ICD-10-CM

## 2023-02-05 DIAGNOSIS — R739 Hyperglycemia, unspecified: Secondary | ICD-10-CM

## 2023-02-05 DIAGNOSIS — Z125 Encounter for screening for malignant neoplasm of prostate: Secondary | ICD-10-CM

## 2023-02-05 NOTE — Telephone Encounter (Signed)
Patient need lab orders.

## 2023-02-06 NOTE — Telephone Encounter (Signed)
Future labs ordered.  

## 2023-02-09 ENCOUNTER — Other Ambulatory Visit (INDEPENDENT_AMBULATORY_CARE_PROVIDER_SITE_OTHER): Payer: Managed Care, Other (non HMO)

## 2023-02-09 DIAGNOSIS — E785 Hyperlipidemia, unspecified: Secondary | ICD-10-CM | POA: Diagnosis not present

## 2023-02-09 DIAGNOSIS — R739 Hyperglycemia, unspecified: Secondary | ICD-10-CM | POA: Diagnosis not present

## 2023-02-09 DIAGNOSIS — D696 Thrombocytopenia, unspecified: Secondary | ICD-10-CM | POA: Diagnosis not present

## 2023-02-09 DIAGNOSIS — Z125 Encounter for screening for malignant neoplasm of prostate: Secondary | ICD-10-CM

## 2023-02-09 LAB — CBC WITH DIFFERENTIAL/PLATELET
Basophils Absolute: 0 10*3/uL (ref 0.0–0.1)
Basophils Relative: 0.7 % (ref 0.0–3.0)
Eosinophils Absolute: 0.1 10*3/uL (ref 0.0–0.7)
Eosinophils Relative: 1.5 % (ref 0.0–5.0)
HCT: 44.3 % (ref 39.0–52.0)
Hemoglobin: 14.6 g/dL (ref 13.0–17.0)
Lymphocytes Relative: 27.1 % (ref 12.0–46.0)
Lymphs Abs: 1.4 10*3/uL (ref 0.7–4.0)
MCHC: 33 g/dL (ref 30.0–36.0)
MCV: 94.8 fL (ref 78.0–100.0)
Monocytes Absolute: 0.5 10*3/uL (ref 0.1–1.0)
Monocytes Relative: 9.6 % (ref 3.0–12.0)
Neutro Abs: 3.3 10*3/uL (ref 1.4–7.7)
Neutrophils Relative %: 61.1 % (ref 43.0–77.0)
Platelets: 146 10*3/uL — ABNORMAL LOW (ref 150.0–400.0)
RBC: 4.68 Mil/uL (ref 4.22–5.81)
RDW: 12.7 % (ref 11.5–15.5)
WBC: 5.3 10*3/uL (ref 4.0–10.5)

## 2023-02-09 LAB — TSH: TSH: 2.32 u[IU]/mL (ref 0.35–5.50)

## 2023-02-09 LAB — HEMOGLOBIN A1C: Hgb A1c MFr Bld: 5.7 % (ref 4.6–6.5)

## 2023-02-09 LAB — HEPATIC FUNCTION PANEL
ALT: 12 U/L (ref 0–53)
AST: 14 U/L (ref 0–37)
Albumin: 4.1 g/dL (ref 3.5–5.2)
Alkaline Phosphatase: 62 U/L (ref 39–117)
Bilirubin, Direct: 0.1 mg/dL (ref 0.0–0.3)
Total Bilirubin: 0.7 mg/dL (ref 0.2–1.2)
Total Protein: 6.1 g/dL (ref 6.0–8.3)

## 2023-02-09 LAB — BASIC METABOLIC PANEL
BUN: 12 mg/dL (ref 6–23)
CO2: 28 meq/L (ref 19–32)
Calcium: 9.1 mg/dL (ref 8.4–10.5)
Chloride: 104 meq/L (ref 96–112)
Creatinine, Ser: 1.19 mg/dL (ref 0.40–1.50)
GFR: 67.03 mL/min (ref >60.00)
Glucose, Bld: 100 mg/dL — ABNORMAL HIGH (ref 70–99)
Potassium: 4 meq/L (ref 3.5–5.1)
Sodium: 140 meq/L (ref 135–145)

## 2023-02-09 LAB — LIPID PANEL
Cholesterol: 129 mg/dL (ref 0–200)
HDL: 42.9 mg/dL (ref >39.00)
LDL Cholesterol: 75 mg/dL (ref 0–99)
NonHDL: 86.1
Total CHOL/HDL Ratio: 3
Triglycerides: 55 mg/dL (ref 0.0–149.0)
VLDL: 11 mg/dL (ref 0.0–40.0)

## 2023-02-09 LAB — PSA: PSA: 3.42 ng/mL (ref 0.10–4.00)

## 2023-02-11 ENCOUNTER — Ambulatory Visit: Payer: Managed Care, Other (non HMO) | Admitting: Physician Assistant

## 2023-02-21 ENCOUNTER — Other Ambulatory Visit: Payer: Self-pay | Admitting: Internal Medicine

## 2023-02-21 DIAGNOSIS — I10 Essential (primary) hypertension: Secondary | ICD-10-CM

## 2023-02-23 ENCOUNTER — Ambulatory Visit (INDEPENDENT_AMBULATORY_CARE_PROVIDER_SITE_OTHER): Payer: Managed Care, Other (non HMO) | Admitting: Internal Medicine

## 2023-02-23 VITALS — BP 128/72 | HR 71 | Temp 97.9°F | Resp 16 | Ht 76.0 in | Wt 246.0 lb

## 2023-02-23 DIAGNOSIS — I10 Essential (primary) hypertension: Secondary | ICD-10-CM

## 2023-02-23 DIAGNOSIS — E785 Hyperlipidemia, unspecified: Secondary | ICD-10-CM

## 2023-02-23 DIAGNOSIS — D696 Thrombocytopenia, unspecified: Secondary | ICD-10-CM | POA: Diagnosis not present

## 2023-02-23 DIAGNOSIS — R739 Hyperglycemia, unspecified: Secondary | ICD-10-CM

## 2023-02-23 DIAGNOSIS — Z0000 Encounter for general adult medical examination without abnormal findings: Secondary | ICD-10-CM | POA: Diagnosis not present

## 2023-02-23 DIAGNOSIS — G4733 Obstructive sleep apnea (adult) (pediatric): Secondary | ICD-10-CM

## 2023-02-23 DIAGNOSIS — N400 Enlarged prostate without lower urinary tract symptoms: Secondary | ICD-10-CM

## 2023-02-23 DIAGNOSIS — K219 Gastro-esophageal reflux disease without esophagitis: Secondary | ICD-10-CM

## 2023-02-23 DIAGNOSIS — M25551 Pain in right hip: Secondary | ICD-10-CM

## 2023-02-23 NOTE — Progress Notes (Signed)
Subjective:    Patient ID: Clifford Alexander, male    DOB: March 12, 1966, 57 y.o.   MRN: [id number]  Patient here for  Chief Complaint  Patient presents with   Annual Exam    HPI Here for a physical exam. Reports he is doing relatively well.  Tries to stay active. No chest pain or sob reported. No cough or congestion reported. Has seen urology for BPH.  On flomax.  Symptoms stable.  Discussed recent labs.  PSA increased from last check - .45 on year ago.  PSA 3.42 with recent lab check.  Discussed f/u with urology. Also reports - right hip/side pain - right side leg.  Discussed PT given persistent issue.  Discussed benefiber - for bowels. Using cpap.    Past Medical History:  Diagnosis Date   Bradycardia    CPAP (continuous positive airway pressure) dependence    Fatigue 06/23/2008   Hyperlipidemia    Hypertension    Insomnia 04/19/2007   Neuromuscular disorder (HCC)    neuropathy in feet   Shingles 01/2016   Sleep apnea    Thrombocytopenia (HCC)    Past Surgical History:  Procedure Laterality Date   LUMBAR LAMINECTOMY  04/22/2007   L4-L5   discectomy 03/2008   Family History  Problem Relation Age of Onset   Hyperthyroidism Mother    Kidney disease Mother    Hypertension Father    Prostate cancer Paternal Grandfather    Social History   Socioeconomic History   Marital status: Married    Spouse name: Not on file   Number of children: Not on file   Years of education: Not on file   Highest education level: Not on file  Occupational History   Not on file  Tobacco Use   Smoking status: Former    Current packs/day: 0.00    Average packs/day: 1 pack/day for 10.0 years (10.0 ttl pk-yrs)    Types: Cigarettes    Start date: 04/18/1982    Quit date: 04/18/1992    Years since quitting: 30.8   Smokeless tobacco: Never   Tobacco comments:    quit in 1994  Vaping Use   Vaping status: Never Used  Substance and Sexual Activity   Alcohol use: No    Alcohol/week: 0.0 standard  drinks of alcohol   Drug use: No   Sexual activity: Yes  Other Topics Concern   Not on file  Social History Narrative   Not on file   Social Determinants of Health   Financial Resource Strain: Not on file  Food Insecurity: Not on file  Transportation Needs: Not on file  Physical Activity: Not on file  Stress: Not on file  Social Connections: Not on file     Review of Systems  Constitutional:  Negative for appetite change and unexpected weight change.  HENT:  Negative for congestion, sinus pressure and sore throat.   Eyes:  Negative for pain and visual disturbance.  Respiratory:  Negative for cough, chest tightness and shortness of breath.   Cardiovascular:  Negative for chest pain, palpitations and leg swelling.  Gastrointestinal:  Negative for abdominal pain, nausea and vomiting.  Genitourinary:  Negative for difficulty urinating and dysuria.  Musculoskeletal:  Negative for joint swelling and myalgias.       Right hip/side/leg pain.   Skin:  Negative for color change and rash.  Neurological:  Negative for dizziness and headaches.  Hematological:  Negative for adenopathy. Does not bruise/bleed easily.  Psychiatric/Behavioral:  Negative for  agitation and dysphoric mood.        Objective:     BP 128/72   Pulse 71   Temp 97.9 F (36.6 C)   Resp 16   Ht 6\' 4"  (1.93 m)   Wt 246 lb (111.6 kg)   SpO2 98%   BMI 29.94 kg/m  Wt Readings from Last 3 Encounters:  02/23/23 246 lb (111.6 kg)  10/20/22 252 lb (114.3 kg)  06/09/22 256 lb (116.1 kg)    Physical Exam Constitutional:      General: He is not in acute distress.    Appearance: Normal appearance. He is well-developed.  HENT:     Head: Normocephalic and atraumatic.     Right Ear: External ear normal.     Left Ear: External ear normal.  Eyes:     General: No scleral icterus.       Right eye: No discharge.        Left eye: No discharge.     Conjunctiva/sclera: Conjunctivae normal.  Neck:     Thyroid: No  thyromegaly.  Cardiovascular:     Rate and Rhythm: Normal rate and regular rhythm.  Pulmonary:     Effort: No respiratory distress.     Breath sounds: Normal breath sounds. No wheezing.  Abdominal:     General: Bowel sounds are normal.     Palpations: Abdomen is soft.     Tenderness: There is no abdominal tenderness.  Musculoskeletal:        General: No swelling or tenderness.     Cervical back: Neck supple. No tenderness.  Lymphadenopathy:     Cervical: No cervical adenopathy.  Skin:    Findings: No erythema or rash.  Neurological:     Mental Status: He is alert and oriented to person, place, and time.  Psychiatric:        Mood and Affect: Mood normal.        Behavior: Behavior normal.      Outpatient Encounter Medications as of 02/23/2023  Medication Sig   Alpha-Lipoic Acid 200 MG CAPS Take 600 mg by mouth daily.   amLODipine (NORVASC) 5 MG tablet Take 1 tablet by mouth once daily   Cholecalciferol (VITAMIN D3) 125 MCG (5000 UT) CAPS Take 1 capsule by mouth daily.   MAGNESIUM OXIDE PO Take 400 mg by mouth daily.   omega-3 acid ethyl esters (LOVAZA) 1 G capsule Take by mouth 2 (two) times daily.   pantoprazole (PROTONIX) 40 MG tablet Take 1 tablet by mouth once daily   Probiotic Product (PROBIOTIC PO) Take by mouth daily.   rosuvastatin (CRESTOR) 10 MG tablet Take 1 tablet (10 mg total) by mouth daily.   tamsulosin (FLOMAX) 0.4 MG CAPS capsule Take 1 capsule (0.4 mg total) by mouth daily.   Turmeric 500 MG CAPS Take 500 mg by mouth daily.    vitamin B-12 (CYANOCOBALAMIN) 100 MCG tablet Take 100 mcg by mouth daily.   [DISCONTINUED] amLODipine (NORVASC) 5 MG tablet Take 1 tablet by mouth once daily   No facility-administered encounter medications on file as of 02/23/2023.     Lab Results  Component Value Date   WBC 5.3 02/09/2023   HGB 14.6 02/09/2023   HCT 44.3 02/09/2023   PLT 146.0 (L) 02/09/2023   GLUCOSE 100 (H) 02/09/2023   CHOL 129 02/09/2023   TRIG 55.0  02/09/2023   HDL 42.90 02/09/2023   LDLCALC 75 02/09/2023   ALT 12 02/09/2023   AST 14 02/09/2023   NA  140 02/09/2023   K 4.0 02/09/2023   CL 104 02/09/2023   CREATININE 1.19 02/09/2023   BUN 12 02/09/2023   CO2 28 02/09/2023   TSH 2.32 02/09/2023   PSA 3.42 02/09/2023   HGBA1C 5.7 02/09/2023    CT CARDIAC SCORING  Addendum Date: 11/08/2022   ADDENDUM REPORT: 11/08/2022 09:44 EXAM: OVER-READ INTERPRETATION  CT CHEST The following report is an over-read performed by radiologist Dr. Narda Rutherford of [facility] on 11/08/2022. This over-read does not include interpretation of cardiac or coronary anatomy or pathology. The coronary calcium score interpretation by the cardiologist is attached. COMPARISON:  None. FINDINGS: Vascular: Minor aortic atherosclerosis. The included aorta is normal in caliber. Mediastinum/nodes: No adenopathy or mass.  Tiny hiatal hernia. Lungs: No focal airspace disease. No pulmonary nodule. No pleural fluid. The included airways are patent. Upper abdomen: No acute or unexpected findings. Musculoskeletal: There are no acute or suspicious osseous abnormalities. Minimal symmetric gynecomastia. IMPRESSION: 1. Tiny hiatal hernia. 2.  Aortic Atherosclerosis (ICD10-I70.0). Electronically Signed   By: Narda Rutherford M.D.   On: 11/08/2022 09:44   Result Date: 11/08/2022 CLINICAL DATA:  Risk stratification EXAM: Coronary Calcium Score TECHNIQUE: The patient was scanned on a Siemens Somatom scanner. Axial non-contrast 3 mm slices were carried out through the heart. The data set was analyzed on a dedicated work station and scored using the Agatson method. FINDINGS: Non-cardiac: See separate report from [facility]. Ascending Aorta: Normal size Pericardium: Normal Coronary arteries: Normal origin of left and right coronary arteries. Distribution of arterial calcifications if present, as noted below; LM 0 LAD 146 LCx 0 RCA 0 Total 146 IMPRESSION AND RECOMMENDATION:  1. Coronary calcium score of 146. This was 78th percentile for age and sex matched control. 2. CAC 100-299 in LAD. CAC-DRS A2/N1. 3. Recommend aspirin and statin if no contraindication. 4. Continue heart healthy lifestyle and risk factor modification. Electronically Signed: By: Debbe Odea M.D. On: 11/03/2022 17:14       Assessment & Plan:  Routine general medical examination at a health care facility  Health care maintenance Assessment & Plan: Physical today 02/23/23. Colonoscopy 09/2017.  Per pt, due f/u in 10 years.  PSA .45 12/30/21. Increased to 3.42 02/09/23.    Thrombocytopenia (HCC) Assessment & Plan: Platelet count 146 on recent check 02/09/23.  Stable.  Follow.    OSA (obstructive sleep apnea) Assessment & Plan: CPAP   Hyperlipidemia, unspecified hyperlipidemia type Assessment & Plan: Continue lipitor.  Low cholesterol diet and exercise.  Follow lipid panel and liver function tests.   Lab Results  Component Value Date   CHOL 129 02/09/2023   HDL 42.90 02/09/2023   LDLCALC 75 02/09/2023   TRIG 55.0 02/09/2023   CHOLHDL 3 02/09/2023      Hyperglycemia Assessment & Plan: Low carb diet and exercise.  Follow met b and a1c.    Gastroesophageal reflux disease without esophagitis Assessment & Plan: No acid reflux on protonix. Follow.    Benign prostatic hyperplasia, unspecified whether lower urinary tract symptoms present Assessment & Plan: Sees urology for f/u BPH. On flomax. Prostate exam today - enlarged. Could not appreciate distinct nodule.  PSA increased from last check - .45 on year ago.  PSA 3.42 with recent lab check. Get him back in with urology for f/u.    Benign essential HTN Assessment & Plan: Continue amlodipine.  Follow pressures.  Follow metabolic panel.    Right hip pain Assessment & Plan: Right hip/side/leg pain.  Discussed PT.  Agreeable.   Orders: -     Ambulatory referral to Physical Therapy     Dale [place], MD

## 2023-02-23 NOTE — Assessment & Plan Note (Signed)
Physical today 02/23/23. Colonoscopy 09/2017.  Per pt, due f/u in 10 years.  PSA .45 12/30/21. Increased to 3.42 02/09/23.

## 2023-02-27 ENCOUNTER — Encounter: Payer: Self-pay | Admitting: Internal Medicine

## 2023-02-27 ENCOUNTER — Other Ambulatory Visit: Payer: Self-pay | Admitting: Physician Assistant

## 2023-02-27 ENCOUNTER — Telehealth: Payer: Self-pay | Admitting: Internal Medicine

## 2023-02-27 DIAGNOSIS — M25551 Pain in right hip: Secondary | ICD-10-CM | POA: Insufficient documentation

## 2023-02-27 DIAGNOSIS — N401 Enlarged prostate with lower urinary tract symptoms: Secondary | ICD-10-CM

## 2023-02-27 NOTE — Telephone Encounter (Signed)
Has been seen by [place] [facility] for BPH.  Recent increase in PSA from .45 last year to 3.42 on 02/09/23.  Needs f/u scheduled with urology.

## 2023-02-27 NOTE — Assessment & Plan Note (Signed)
Low carb diet and exercise.  Follow met b and a1c.   

## 2023-02-27 NOTE — Assessment & Plan Note (Signed)
Right hip/side/leg pain.  Discussed PT.  Agreeable.

## 2023-02-27 NOTE — Assessment & Plan Note (Signed)
Platelet count 146 on recent check 02/09/23.  Stable.  Follow.

## 2023-02-27 NOTE — Assessment & Plan Note (Signed)
CPAP.  

## 2023-02-27 NOTE — Assessment & Plan Note (Signed)
Continue amlodipine.  Follow pressures.  Follow metabolic panel.   

## 2023-02-27 NOTE — Assessment & Plan Note (Signed)
Continue lipitor.  Low cholesterol diet and exercise.  Follow lipid panel and liver function tests.   Lab Results  Component Value Date   CHOL 129 02/09/2023   HDL 42.90 02/09/2023   LDLCALC 75 02/09/2023   TRIG 55.0 02/09/2023   CHOLHDL 3 02/09/2023

## 2023-02-27 NOTE — Assessment & Plan Note (Signed)
Sees urology for f/u BPH. On flomax. Prostate exam today - enlarged. Could not appreciate distinct nodule.  PSA increased from last check - .45 on year ago.  PSA 3.42 with recent lab check. Get him back in with urology for f/u.

## 2023-02-27 NOTE — Assessment & Plan Note (Signed)
No acid reflux on protonix.  Follow.  

## 2023-03-02 ENCOUNTER — Other Ambulatory Visit: Payer: Self-pay | Admitting: Physician Assistant

## 2023-03-02 DIAGNOSIS — N401 Enlarged prostate with lower urinary tract symptoms: Secondary | ICD-10-CM

## 2023-03-05 ENCOUNTER — Telehealth: Payer: Self-pay | Admitting: Physician Assistant

## 2023-03-05 DIAGNOSIS — N401 Enlarged prostate with lower urinary tract symptoms: Secondary | ICD-10-CM

## 2023-03-05 MED ORDER — TAMSULOSIN HCL 0.4 MG PO CAPS: 0.4000 mg | ORAL_CAPSULE | Freq: Every day | ORAL | 1 refills | Status: DC

## 2023-03-05 NOTE — Telephone Encounter (Signed)
Patient advised.

## 2023-03-05 NOTE — Telephone Encounter (Signed)
Patient called to request refill for Tamsulosin. Original request was denied because patient needed appointment. It looks like he scheduled an appointment for 04/06/23. Can he have refill to last until appointment? Please advise.

## 2023-03-05 NOTE — Telephone Encounter (Signed)
Sent to [facility] [address]. Please keep appt.

## 2023-04-04 ENCOUNTER — Other Ambulatory Visit: Payer: Self-pay | Admitting: Physician Assistant

## 2023-04-04 DIAGNOSIS — N401 Enlarged prostate with lower urinary tract symptoms: Secondary | ICD-10-CM

## 2023-04-06 ENCOUNTER — Ambulatory Visit: Payer: Managed Care, Other (non HMO) | Admitting: Physician Assistant

## 2023-04-06 ENCOUNTER — Encounter: Payer: Self-pay | Admitting: Internal Medicine

## 2023-04-06 VITALS — BP 155/89 | HR 62 | Ht 76.0 in | Wt 242.0 lb

## 2023-04-06 DIAGNOSIS — N401 Enlarged prostate with lower urinary tract symptoms: Secondary | ICD-10-CM

## 2023-04-06 DIAGNOSIS — R972 Elevated prostate specific antigen [PSA]: Secondary | ICD-10-CM | POA: Diagnosis not present

## 2023-04-06 DIAGNOSIS — R339 Retention of urine, unspecified: Secondary | ICD-10-CM

## 2023-04-06 LAB — BLADDER SCAN AMB NON-IMAGING: Scan Result: 412

## 2023-04-06 NOTE — Patient Instructions (Signed)
 Prostate MRI Prep:  1- No ejaculation 48 hours prior to exam  2- No caffeine or carbonated beverages on day of the exam  3- Eat light diet evening prior and day of exam  4- Avoid eating 4 hours prior to exam  5- Fleets enema needs to be done 4 hours prior to exam -See below. Can be purchased at the drug store.

## 2023-04-06 NOTE — Progress Notes (Signed)
 04/06/2023 9:12 AM   Oneil LELON Shed [birth year] [id number]  CC: Chief Complaint  Patient presents with   Benign Prostatic Hypertrophy   HPI: RONDELL PARDON is a 58 y.o. male with PMH BPH with incomplete bladder emptying on Flomax  and OSA with nocturia on CPAP who presents today for annual follow-up.   Today he reports overall stable voiding symptoms this year.  He remains on Flomax  and has some dizziness in the morning.  His PSA rose considerably last year, see below.  He thinks he may have ejaculated in the 24 to 48 hours prior to his most recent lab draw. 02/09/2023: 3.42 12/30/2021: 0.45 08/20/2020: 0.47 10/25/2018: 0.55 10/26/2017: 0.65 10/27/2016: 0.61  IPSS 8/pleased as below, previously 13/mostly satisfied.  PVR 412 mL, previously 241 mL.  PMH: Past Medical History:  Diagnosis Date   Bradycardia    CPAP (continuous positive airway pressure) dependence    Fatigue 06/23/2008   Hyperlipidemia    Hypertension    Insomnia 04/19/2007   Neuromuscular disorder (HCC)    neuropathy in feet   Shingles 01/2016   Sleep apnea    Thrombocytopenia [facility]     Surgical History: Past Surgical History:  Procedure Laterality Date   LUMBAR LAMINECTOMY  04/22/2007   L4-L5   discectomy 03/2008    Home Medications:  Allergies as of 04/06/2023       Reactions   Naproxen Shortness Of Breath   Ciprofloxacin    Ibuprofen         Medication List        Accurate as of April 06, 2023  9:12 AM. If you have any questions, ask your nurse or doctor.          Alpha-Lipoic Acid 200 MG Caps Take 600 mg by mouth daily.   amLODipine  5 MG tablet Commonly known as: NORVASC  Take 1 tablet by mouth once daily   cyanocobalamin 100 MCG tablet Commonly known as: VITAMIN B12 Take 100 mcg by mouth daily.   MAGNESIUM OXIDE PO Take 400 mg by mouth daily.   omega-3 acid ethyl esters 1 g capsule Commonly known as: LOVAZA Take by mouth 2 (two) times daily.   pantoprazole  40 MG  tablet Commonly known as: PROTONIX  Take 1 tablet by mouth once daily   PROBIOTIC PO Take by mouth daily.   rosuvastatin  10 MG tablet Commonly known as: Crestor  Take 1 tablet (10 mg total) by mouth daily.   tamsulosin  0.4 MG Caps capsule Commonly known as: FLOMAX  Take 1 capsule (0.4 mg total) by mouth daily.   Turmeric 500 MG Caps Take 500 mg by mouth daily.   Vitamin D3 125 MCG (5000 UT) Caps Take 1 capsule by mouth daily.        Allergies:  Allergies  Allergen Reactions   Naproxen Shortness Of Breath   Ciprofloxacin    Ibuprofen     Family History: Family History  Problem Relation Age of Onset   Hyperthyroidism Mother    Kidney disease Mother    Hypertension Father    Prostate cancer Paternal Grandfather     Social History:   reports that he quit smoking about 30 years ago. His smoking use included cigarettes. He started smoking about 40 years ago. He has a 10 pack-year smoking history. He has never used smokeless tobacco. He reports that he does not drink alcohol and does not use drugs.  Physical Exam: BP (!) 155/89   Pulse 62   Ht 6' 4 (1.93 m)  Wt 242 lb (109.8 kg)   BMI 29.46 kg/m   Constitutional:  Alert and oriented, no acute distress, nontoxic appearing HEENT: [place], AT Cardiovascular: No clubbing, cyanosis, or edema Respiratory: Normal respiratory effort, no increased work of breathing DRE: Normal sphincter tone. Smooth, symmetrically enlarged 40+cc prostate without nodules or induration. Skin: No rashes, bruises or suspicious lesions Neurologic: Grossly intact, no focal deficits, moving all 4 extremities Psychiatric: Normal mood and affect  Laboratory Data: Results for orders placed or performed in visit on 04/06/23  Bladder Scan (Post Void Residual) in office   Collection Time: 04/06/23  9:13 AM  Result Value Ref Range   Scan Result 412 ml    Assessment & Plan:   1. Benign prostatic hyperplasia with incomplete bladder emptying  (Primary) IPSS and reported symptoms are stable on Flomax , though it appears he may be having some a.m. orthostasis on this.  His residual has risen significantly over the last year, but I do not think he would be able to tolerate an increased dose of Flomax  due to orthostasis.  I had previously had him on silodosin , but he preferred Flomax .  We discussed that he may need to consider outlet procedures, though will defer workup at this time pending #2 below. - Bladder Scan (Post Void Residual) in office  2. Rising PSA level Concerning PSA velocity this year.  Will repeat PSA this morning. Benign DRE. We discussed that this could be due to increased residual as above, though I recommend pursuing prostate MRI for further evaluation. If MR is benign, will recommend cysto for consideration of outlet procedures; otherwise will pursue biopsy if indicated. - PSA Total (Reflex To Free) - MR PROSTATE W WO CONTRAST; Future   Return for Will call with results.  Lucie Hones, PA-C  [facility] [place] [address] [place], [place] [address] [phone]

## 2023-04-07 LAB — PSA TOTAL (REFLEX TO FREE): Prostate Specific Ag, Serum: 1.6 ng/mL (ref 0.0–4.0)

## 2023-04-09 NOTE — Progress Notes (Signed)
 Great news, his PSA has come down considerably and is now 1.6. Given this improvement, I think we can hold off on prostate MRI and would recommend lab visit for repeat PSA in 3 months. If he is in agreement, please let me know so I can cancel MRI order.

## 2023-04-10 NOTE — Addendum Note (Signed)
 Addended by: Debarah Crape on: 04/10/2023 09:55 AM   Modules accepted: Orders

## 2023-04-13 ENCOUNTER — Other Ambulatory Visit: Payer: Self-pay

## 2023-04-13 DIAGNOSIS — N401 Enlarged prostate with lower urinary tract symptoms: Secondary | ICD-10-CM

## 2023-04-13 MED ORDER — TAMSULOSIN HCL 0.4 MG PO CAPS: 0.4000 mg | ORAL_CAPSULE | Freq: Every day | ORAL | 3 refills | Status: AC

## 2023-04-24 ENCOUNTER — Other Ambulatory Visit: Payer: Self-pay | Admitting: Neurology

## 2023-04-24 DIAGNOSIS — R42 Dizziness and giddiness: Secondary | ICD-10-CM

## 2023-04-24 DIAGNOSIS — M436 Torticollis: Secondary | ICD-10-CM

## 2023-04-24 DIAGNOSIS — M542 Cervicalgia: Secondary | ICD-10-CM

## 2023-05-21 ENCOUNTER — Other Ambulatory Visit: Payer: Self-pay | Admitting: Internal Medicine

## 2023-05-21 DIAGNOSIS — I10 Essential (primary) hypertension: Secondary | ICD-10-CM

## 2023-05-31 ENCOUNTER — Encounter: Payer: Self-pay | Admitting: Emergency Medicine

## 2023-05-31 ENCOUNTER — Ambulatory Visit
Admission: EM | Admit: 2023-05-31 | Discharge: 2023-05-31 | Disposition: A | Discharge status: Home / Self Care | Attending: Emergency Medicine | Admitting: Emergency Medicine

## 2023-05-31 ENCOUNTER — Other Ambulatory Visit: Payer: Self-pay

## 2023-05-31 DIAGNOSIS — J069 Acute upper respiratory infection, unspecified: Secondary | ICD-10-CM | POA: Diagnosis not present

## 2023-05-31 LAB — POC COVID19/FLU A&B COMBO
Covid Antigen, POC: NEGATIVE
Influenza A Antigen, POC: NEGATIVE
Influenza B Antigen, POC: NEGATIVE

## 2023-05-31 MED ORDER — AZITHROMYCIN 250 MG PO TABS: 250.0000 mg | ORAL_TABLET | Freq: Every day | ORAL | 0 refills | Status: DC

## 2023-05-31 NOTE — ED Triage Notes (Signed)
 Patient presents to [facility] for evaluation of cough, sinus congestion, sore throat, and painful neck since Tuesday.  Was getting better but then worse Friday night.  Denies fevers, some chills.

## 2023-05-31 NOTE — Discharge Instructions (Signed)
 COVID and flu test negative  Begin azithromycin  prophylactically to ideally prevent symptoms from worsening    You can take Tylenol and/or Ibuprofen as needed for fever reduction and pain relief.   For cough: honey 1/2 to 1 teaspoon (you can dilute the honey in water or another fluid).  You can also use guaifenesin and dextromethorphan for cough. You can use a humidifier for chest congestion and cough.  If you don't have a humidifier, you can sit in the bathroom with the hot shower running.      For sore throat: try warm salt water gargles, cepacol lozenges, throat spray, warm tea or water with lemon/honey, popsicles or ice, or OTC cold relief medicine for throat discomfort.   For congestion: take a daily anti-histamine like Zyrtec, Claritin, and a oral decongestant, such as pseudoephedrine.  You can also use Flonase 1-2 sprays in each nostril daily.   It is important to stay hydrated: drink plenty of fluids (water, gatorade/powerade/pedialyte, juices, or teas) to keep your throat moisturized and help further relieve irritation/discomfort.

## 2023-05-31 NOTE — ED Provider Notes (Signed)
 Clifford Alexander    CSN: [id number] Arrival date & time: 05/31/23  2130      History   Chief Complaint Chief Complaint  Patient presents with   URI    HPI Clifford Alexander is a 58 y.o. male.   Patient presents for evaluation of chills, nasal congestion, productive cough, crackles in the bilateral ears for 5 days.  Were improving but worsening for 1 day ago, started to experience shortness of breath with exertion which has resolved no known sick contacts.  Tolerating food and some Coricidin, black seed oil and oregano oil.  Denies fever.   Past Medical History:  Diagnosis Date   Bradycardia    CPAP (continuous positive airway pressure) dependence    Fatigue 06/23/2008   Hyperlipidemia    Hypertension    Insomnia 04/19/2007   Neuromuscular disorder (HCC)    neuropathy in feet   Shingles 01/2016   Sleep apnea    Thrombocytopenia (HCC)     Patient Active Problem List   Diagnosis Date Noted   Elevated PSA 04/06/2023   Right hip pain 02/27/2023   Viral illness 06/15/2022   BPH (benign prostatic hyperplasia) 02/10/2022   Vision changes 02/02/2022   Knee pain, left 09/30/2021   Light headedness 02/26/2021   Neck pain 02/25/2021   Hernia, inguinal 02/26/2020   Thrombocytopenia (HCC) 10/09/2018   Renal cyst 10/09/2018   OSA (obstructive sleep apnea) 09/26/2017   Headache 09/26/2017   Polyneuropathy 07/18/2017   Abdominal pain 07/13/2017   CTS (carpal tunnel syndrome) 02/05/2017   Numbness in feet 10/27/2016   Vitamin D deficiency 10/27/2016   Health care maintenance 11/19/2015   Acute bilateral low back pain without sciatica 07/18/2015   History of hay fever 10/23/2014   GERD (gastroesophageal reflux disease) 10/23/2014   Cephalalgia 10/23/2014   Dysmetabolic syndrome 10/23/2014   Hyperglycemia 10/23/2014   Hyperlipidemia 10/23/2014   External thrombosed hemorrhoids 04/06/2008   Combined fat and carbohydrate induced hyperlipemia 02/14/2008   Cannot sleep  04/19/2007   Benign essential HTN 08/05/2006    Past Surgical History:  Procedure Laterality Date   LUMBAR LAMINECTOMY  04/22/2007   L4-L5   discectomy 03/2008       Home Medications    Prior to Admission medications   Medication Sig Start Date End Date Taking? Authorizing Provider  azithromycin (ZITHROMAX) 250 MG tablet Take 1 tablet (250 mg total) by mouth daily. Take first 2 tablets together, then 1 every day until finished. 05/31/23  Yes Sol Englert R, NP  Alpha-Lipoic Acid 200 MG CAPS Take 600 mg by mouth daily.    [provider]  amLODipine (NORVASC) 5 MG tablet Take 1 tablet by mouth once daily 05/21/23   Dale [place], MD  Cholecalciferol (VITAMIN D3) 125 MCG (5000 UT) CAPS Take 1 capsule by mouth daily.    [provider]  MAGNESIUM OXIDE PO Take 400 mg by mouth daily.    [provider]  omega-3 acid ethyl esters (LOVAZA) 1 G capsule Take by mouth 2 (two) times daily.    [provider]  pantoprazole (PROTONIX) 40 MG tablet Take 1 tablet by mouth once daily 06/22/21   Worthy Rancher B, FNP  Probiotic Product (PROBIOTIC PO) Take by mouth daily.    [provider]  rosuvastatin (CRESTOR) 10 MG tablet Take 1 tablet (10 mg total) by mouth daily. 11/11/22   Dale Bishop, MD  tamsulosin (FLOMAX) 0.4 MG CAPS capsule Take 1 capsule (0.4 mg total) by mouth  daily. 04/13/23   Vaillancourt, Lelon Mast, PA-C  Turmeric 500 MG CAPS Take 500 mg by mouth daily.     [provider]  vitamin B-12 (CYANOCOBALAMIN) 100 MCG tablet Take 100 mcg by mouth daily.    [provider]    Family History Family History  Problem Relation Age of Onset   Hyperthyroidism Mother    Kidney disease Mother    Hypertension Father    Prostate cancer Paternal Grandfather     Social History Social History   Tobacco Use   Smoking status: Former    Current packs/day: 0.00    Average packs/day: 1 pack/day for 10.0 years (10.0 ttl pk-yrs)     Types: Cigarettes    Start date: 04/18/1982    Quit date: 04/18/1992    Years since quitting: 31.1   Smokeless tobacco: Never   Tobacco comments:    quit in 1994  Vaping Use   Vaping status: Never Used  Substance Use Topics   Alcohol use: No    Alcohol/week: 0.0 standard drinks of alcohol   Drug use: No     Allergies   Naproxen, Ciprofloxacin, and Ibuprofen   Review of Systems Review of Systems   Physical Exam Triage Vital Signs ED Triage Vitals  Encounter Vitals Group     BP 05/31/23 0910 (!) 143/90     Systolic BP Percentile --      Diastolic BP Percentile --      Pulse Rate 05/31/23 0910 77     Resp 05/31/23 0910 16     Temp 05/31/23 0910 99.1 F (37.3 C)     Temp src --      SpO2 05/31/23 0910 98 %     Weight --      Height --      Head Circumference --      Peak Flow --      Pain Score 05/31/23 0909 2     Pain Loc --      Pain Education --      Exclude from Growth Chart --    No data found.  Updated Vital Signs BP (!) 143/90 (BP Location: Left Arm)   Pulse 77   Temp 99.1 F (37.3 C)   Resp 16   SpO2 98%   Visual Acuity Right Eye Distance:   Left Eye Distance:   Bilateral Distance:    Right Eye Near:   Left Eye Near:    Bilateral Near:     Physical Exam Constitutional:      Appearance: Normal appearance.  HENT:     Head: Normocephalic.     Right Ear: Tympanic membrane, ear canal and external ear normal.     Left Ear: Tympanic membrane, ear canal and external ear normal.     Nose: Nose normal.  Eyes:     Extraocular Movements: Extraocular movements intact.  Cardiovascular:     Rate and Rhythm: Normal rate and regular rhythm.     Pulses: Normal pulses.     Heart sounds: Normal heart sounds.  Pulmonary:     Effort: Pulmonary effort is normal.     Breath sounds: Normal breath sounds.  Neurological:     Mental Status: He is alert and oriented to person, place, and time. Mental status is at baseline.      UC Treatments / Results   Labs (all labs ordered are listed, but only abnormal results are displayed) Labs Reviewed  POC COVID19/FLU A&B COMBO - Normal  EKG   Radiology No results found.  Procedures Procedures (including critical care time)  Medications Ordered in UC Medications - No data to display  Initial Impression / Assessment and Plan / UC Course  I have reviewed the triage vital signs and the nursing notes.  Pertinent labs & imaging results that were available during my care of the patient were reviewed by me and considered in my medical decision making (see chart for details).  Acute URI  Patient is in no signs of distress nor toxic appearing.  Vital signs are stable.  Low suspicion for pneumonia, pneumothorax or bronchitis and therefore will defer imaging.  COVID and flu test negative however as symptoms have worsened over the past day he is experiencing shortness of breath we will prophylactically placed on antibiotic, prescribed azithromycin, declined prescription for cough medicine.May use additional over-the-counter medications as needed for supportive care.  May follow-up with urgent care as needed if symptoms persist or worsen.    Final Clinical Impressions(s) / UC Diagnoses   Final diagnoses:  Acute URI     Discharge Instructions      COVID and flu test negative  Begin azithromycin  prophylactically to ideally prevent symptoms from worsening    You can take Tylenol and/or Ibuprofen as needed for fever reduction and pain relief.   For cough: honey 1/2 to 1 teaspoon (you can dilute the honey in water or another fluid).  You can also use guaifenesin and dextromethorphan for cough. You can use a humidifier for chest congestion and cough.  If you don't have a humidifier, you can sit in the bathroom with the hot shower running.      For sore throat: try warm salt water gargles, cepacol lozenges, throat spray, warm tea or water with lemon/honey, popsicles or ice, or OTC cold relief  medicine for throat discomfort.   For congestion: take a daily anti-histamine like Zyrtec, Claritin, and a oral decongestant, such as pseudoephedrine.  You can also use Flonase 1-2 sprays in each nostril daily.   It is important to stay hydrated: drink plenty of fluids (water, gatorade/powerade/pedialyte, juices, or teas) to keep your throat moisturized and help further relieve irritation/discomfort.    ED Prescriptions     Medication Sig Dispense Auth. Provider   azithromycin (ZITHROMAX) 250 MG tablet Take 1 tablet (250 mg total) by mouth daily. Take first 2 tablets together, then 1 every day until finished. 6 tablet Valinda Hoar, NP      PDMP not reviewed this encounter.   Valinda Hoar, [place] 05/31/23 [phone]

## 2023-06-11 ENCOUNTER — Telehealth: Payer: Self-pay | Admitting: Internal Medicine

## 2023-06-11 DIAGNOSIS — E785 Hyperlipidemia, unspecified: Secondary | ICD-10-CM

## 2023-06-11 DIAGNOSIS — R739 Hyperglycemia, unspecified: Secondary | ICD-10-CM

## 2023-06-11 DIAGNOSIS — I10 Essential (primary) hypertension: Secondary | ICD-10-CM

## 2023-06-11 NOTE — Telephone Encounter (Signed)
 Patient need lab orders.

## 2023-06-12 NOTE — Addendum Note (Signed)
 Addended by: Charm Barges on: 06/12/2023 04:39 AM   Modules accepted: Orders

## 2023-06-12 NOTE — Telephone Encounter (Signed)
Order placed for f/u labs.  

## 2023-06-15 ENCOUNTER — Other Ambulatory Visit (INDEPENDENT_AMBULATORY_CARE_PROVIDER_SITE_OTHER): Payer: Managed Care, Other (non HMO)

## 2023-06-15 DIAGNOSIS — E785 Hyperlipidemia, unspecified: Secondary | ICD-10-CM | POA: Diagnosis not present

## 2023-06-15 DIAGNOSIS — R739 Hyperglycemia, unspecified: Secondary | ICD-10-CM | POA: Diagnosis not present

## 2023-06-15 DIAGNOSIS — I10 Essential (primary) hypertension: Secondary | ICD-10-CM

## 2023-06-15 LAB — LIPID PANEL
Cholesterol: 184 mg/dL (ref 0–200)
HDL: 48.7 mg/dL (ref >39.00)
LDL Cholesterol: 122 mg/dL — ABNORMAL HIGH (ref 0–99)
NonHDL: 134.84
Total CHOL/HDL Ratio: 4
Triglycerides: 62 mg/dL (ref 0.0–149.0)
VLDL: 12.4 mg/dL (ref 0.0–40.0)

## 2023-06-15 LAB — HEPATIC FUNCTION PANEL
ALT: 13 U/L (ref 0–53)
AST: 17 U/L (ref 0–37)
Albumin: 4.4 g/dL (ref 3.5–5.2)
Alkaline Phosphatase: 57 U/L (ref 39–117)
Bilirubin, Direct: 0.2 mg/dL (ref 0.0–0.3)
Total Bilirubin: 1 mg/dL (ref 0.2–1.2)
Total Protein: 6.2 g/dL (ref 6.0–8.3)

## 2023-06-15 LAB — CBC WITH DIFFERENTIAL/PLATELET
Basophils Absolute: 0 10*3/uL (ref 0.0–0.1)
Basophils Relative: 0.5 % (ref 0.0–3.0)
Eosinophils Absolute: 0.1 10*3/uL (ref 0.0–0.7)
Eosinophils Relative: 1.2 % (ref 0.0–5.0)
HCT: 47.8 % (ref 39.0–52.0)
Hemoglobin: 15.8 g/dL (ref 13.0–17.0)
Lymphocytes Relative: 30.4 % (ref 12.0–46.0)
Lymphs Abs: 1.4 10*3/uL (ref 0.7–4.0)
MCHC: 33.1 g/dL (ref 30.0–36.0)
MCV: 94.3 fl (ref 78.0–100.0)
Monocytes Absolute: 0.5 10*3/uL (ref 0.1–1.0)
Monocytes Relative: 10.6 % (ref 3.0–12.0)
Neutro Abs: 2.6 10*3/uL (ref 1.4–7.7)
Neutrophils Relative %: 57.3 % (ref 43.0–77.0)
Platelets: 174 10*3/uL (ref 150.0–400.0)
RBC: 5.07 Mil/uL (ref 4.22–5.81)
RDW: 13.2 % (ref 11.5–15.5)
WBC: 4.5 10*3/uL (ref 4.0–10.5)

## 2023-06-15 LAB — BASIC METABOLIC PANEL
BUN: 17 mg/dL (ref 6–23)
CO2: 29 meq/L (ref 19–32)
Calcium: 9.4 mg/dL (ref 8.4–10.5)
Chloride: 105 meq/L (ref 96–112)
Creatinine, Ser: 1.18 mg/dL (ref 0.40–1.50)
GFR: 67.55 mL/min (ref >60.00)
Glucose, Bld: 92 mg/dL (ref 70–99)
Potassium: 4.3 meq/L (ref 3.5–5.1)
Sodium: 141 meq/L (ref 135–145)

## 2023-06-15 LAB — HEMOGLOBIN A1C: Hgb A1c MFr Bld: 5.8 % (ref 4.6–6.5)

## 2023-06-28 NOTE — Progress Notes (Signed)
 Subjective:    Patient ID: Clifford Alexander, male    DOB: 02-Jul-1965, 58 y.o.   MRN: [id number]  Patient here for  Chief Complaint  Patient presents with   Medical Management of Chronic Issues    HPI Here for a scheduled follow up - follow up regarding sleep apnea, hypercholesterolemia and hypertension. Sees urology for BPH. On flomax. Saw urology 04/06/23 - f/u on increased psa. Recheck 1.6. recommended 3 month f/u. Saw neurology 04/20/23 - f/u generalized snesorimotor polyneuropathy + bilateral carpal tunnel syndrome. Recommended continue alpha lipoic acid. Also f/u dizziness with negative ENT w/up. Dix Hallpike's - normal. VNG normal. MRI 03/2022 - no acute intracranial abnormality. Continue magnesium. Also f/u cervicalgia and msk neck pain. Recommended MRI c-spine (not performed) and PT.  Recent calcium score 146. Stays active. Walking. No chest pain or sob reported.    Past Medical History:  Diagnosis Date   Bradycardia    CPAP (continuous positive airway pressure) dependence    Fatigue 06/23/2008   Hyperlipidemia    Hypertension    Insomnia 04/19/2007   Neuromuscular disorder (HCC)    neuropathy in feet   Shingles 01/2016   Sleep apnea    Thrombocytopenia (HCC)    Past Surgical History:  Procedure Laterality Date   LUMBAR LAMINECTOMY  04/22/2007   L4-L5   discectomy 03/2008   Family History  Problem Relation Age of Onset   Hyperthyroidism Mother    Kidney disease Mother    Hypertension Father    Prostate cancer Paternal Grandfather    Social History   Socioeconomic History   Marital status: Married    Spouse name: Not on file   Number of children: Not on file   Years of education: Not on file   Highest education level: Not on file  Occupational History   Not on file  Tobacco Use   Smoking status: Former    Current packs/day: 0.00    Average packs/day: 1 pack/day for 10.0 years (10.0 ttl pk-yrs)    Types: Cigarettes    Start date: 04/18/1982    Quit date:  04/18/1992    Years since quitting: 31.2   Smokeless tobacco: Never   Tobacco comments:    quit in 1994  Vaping Use   Vaping status: Never Used  Substance and Sexual Activity   Alcohol use: No    Alcohol/week: 0.0 standard drinks of alcohol   Drug use: No   Sexual activity: Yes  Other Topics Concern   Not on file  Social History Narrative   Not on file   Social Drivers of Health   Financial Resource Strain: Not on file  Food Insecurity: Not on file  Transportation Needs: Not on file  Physical Activity: Not on file  Stress: Not on file  Social Connections: Not on file     Review of Systems  Constitutional:  Negative for appetite change and unexpected weight change.  HENT:  Negative for congestion and sinus pressure.   Respiratory:  Negative for cough, chest tightness and shortness of breath.   Cardiovascular:  Negative for chest pain, palpitations and leg swelling.  Gastrointestinal:  Negative for abdominal pain, diarrhea, nausea and vomiting.  Genitourinary:  Negative for difficulty urinating and dysuria.  Musculoskeletal:  Positive for neck pain. Negative for myalgias.  Skin:  Negative for color change and rash.  Neurological:  Negative for headaches.       No increased dizziness.   Psychiatric/Behavioral:  Negative for agitation and dysphoric mood.  Objective:     BP 124/76   Pulse 76   Temp 98 F (36.7 C)   Resp 16   Ht 6\' 4"  (1.93 m)   Wt 244 lb (110.7 kg)   SpO2 98%   BMI 29.70 kg/m  Wt Readings from Last 3 Encounters:  06/29/23 244 lb (110.7 kg)  04/06/23 242 lb (109.8 kg)  02/23/23 246 lb (111.6 kg)    Physical Exam Vitals reviewed.  Constitutional:      General: He is not in acute distress.    Appearance: Normal appearance. He is well-developed.  HENT:     Head: Normocephalic and atraumatic.     Right Ear: External ear normal.     Left Ear: External ear normal.     Mouth/Throat:     Pharynx: No oropharyngeal exudate or posterior  oropharyngeal erythema.  Eyes:     General: No scleral icterus.       Right eye: No discharge.        Left eye: No discharge.     Conjunctiva/sclera: Conjunctivae normal.  Cardiovascular:     Rate and Rhythm: Normal rate and regular rhythm.  Pulmonary:     Effort: Pulmonary effort is normal. No respiratory distress.     Breath sounds: Normal breath sounds.  Abdominal:     General: Bowel sounds are normal.     Palpations: Abdomen is soft.     Tenderness: There is no abdominal tenderness.  Musculoskeletal:        General: No swelling or tenderness.     Cervical back: Neck supple. No tenderness.  Lymphadenopathy:     Cervical: No cervical adenopathy.  Skin:    Findings: No erythema or rash.  Neurological:     Mental Status: He is alert.  Psychiatric:        Mood and Affect: Mood normal.        Behavior: Behavior normal.         Outpatient Encounter Medications as of 06/29/2023  Medication Sig   Alpha-Lipoic Acid 200 MG CAPS Take 600 mg by mouth daily.   amLODipine (NORVASC) 5 MG tablet Take 1 tablet (5 mg total) by mouth daily.   Cholecalciferol (VITAMIN D3) 125 MCG (5000 UT) CAPS Take 1 capsule by mouth daily.   MAGNESIUM OXIDE PO Take 400 mg by mouth daily.   omega-3 acid ethyl esters (LOVAZA) 1 G capsule Take by mouth 2 (two) times daily.   pantoprazole (PROTONIX) 40 MG tablet Take 1 tablet by mouth once daily   Probiotic Product (PROBIOTIC PO) Take by mouth daily.   rosuvastatin (CRESTOR) 10 MG tablet Take 1 tablet 2x/week.   tamsulosin (FLOMAX) 0.4 MG CAPS capsule Take 1 capsule (0.4 mg total) by mouth daily.   Turmeric 500 MG CAPS Take 500 mg by mouth daily.    vitamin B-12 (CYANOCOBALAMIN) 100 MCG tablet Take 100 mcg by mouth daily.   [DISCONTINUED] amLODipine (NORVASC) 5 MG tablet Take 1 tablet by mouth once daily   [DISCONTINUED] azithromycin (ZITHROMAX) 250 MG tablet Take 1 tablet (250 mg total) by mouth daily. Take first 2 tablets together, then 1 every day until  finished.   [DISCONTINUED] rosuvastatin (CRESTOR) 10 MG tablet Take 1 tablet (10 mg total) by mouth daily.   No facility-administered encounter medications on file as of 06/29/2023.     Lab Results  Component Value Date   WBC 4.5 06/15/2023   HGB 15.8 06/15/2023   HCT 47.8 06/15/2023   PLT 174.0 06/15/2023  GLUCOSE 92 06/15/2023   CHOL 184 06/15/2023   TRIG 62.0 06/15/2023   HDL 48.70 06/15/2023   LDLCALC 122 (H) 06/15/2023   ALT 13 06/15/2023   AST 17 06/15/2023   NA 141 06/15/2023   K 4.3 06/15/2023   CL 105 06/15/2023   CREATININE 1.18 06/15/2023   BUN 17 06/15/2023   CO2 29 06/15/2023   TSH 2.32 02/09/2023   PSA 3.42 02/09/2023   HGBA1C 5.8 06/15/2023       Assessment & Plan:  Benign prostatic hyperplasia, unspecified whether lower urinary tract symptoms present Assessment & Plan: Sees urology for BPH. On flomax. Saw urology 04/06/23 - f/u on increased psa. Recheck 1.6. recommended 3 month f/u.    Hyperglycemia Assessment & Plan: Low carb diet and exercise.  Follow met b and a1c.   Orders: -     Hemoglobin A1c; Future  Hyperlipidemia, unspecified hyperlipidemia type Assessment & Plan: Continue lipitor.  Low cholesterol diet and exercise.  Follow lipid panel and liver function tests.  Lab Results  Component Value Date   CHOL 184 06/15/2023   HDL 48.70 06/15/2023   LDLCALC 122 (H) 06/15/2023   TRIG 62.0 06/15/2023   CHOLHDL 4 06/15/2023     Orders: -     Lipid panel; Future -     Hepatic function panel; Future  Benign essential HTN Assessment & Plan: Continue amlodipine.  Follow pressures.  Follow metabolic panel. No changes in medication today.   Orders: -     Basic metabolic panel with GFR; Future -     amLODIPine Besylate; Take 1 tablet (5 mg total) by mouth daily.  Dispense: 90 tablet; Refill: 1  Elevated PSA Assessment & Plan: Sees urology for BPH. On flomax. Saw urology 04/06/23 - f/u on increased psa. Recheck 1.6. recommended 3 month f/u.     Gastroesophageal reflux disease without esophagitis Assessment & Plan: No acid reflux on protonix. Follow. Hold on changing.     Light headedness Assessment & Plan: Saw neurology 04/20/23 - f/u generalized snesorimotor polyneuropathy + bilateral carpal tunnel syndrome. Recommended continue alpha lipoic acid. Also f/u dizziness with negative ENT w/up. Dix Hallpike's - normal. VNG normal. MRI 03/2022 - no acute intracranial abnormality. Continue magnesium. Also f/u cervicalgia and msk neck pain. Recommended MRI c-spine (not performed) and PT. Follow up on  getting PT scheduled.    Neck pain Assessment & Plan: Saw neurology 04/20/23 - f/u generalized snesorimotor polyneuropathy + bilateral carpal tunnel syndrome. Recommended continue alpha lipoic acid. Also f/u dizziness with negative ENT w/up. Dix Hallpike's - normal. VNG normal. MRI 03/2022 - no acute intracranial abnormality. Continue magnesium. Also f/u cervicalgia and msk neck pain. Recommended MRI c-spine (not performed) and PT. Follow up on getting PT scheduled.    OSA (obstructive sleep apnea) Assessment & Plan: CPAP.    Polyneuropathy Assessment & Plan:  Saw neurology 04/20/23 - f/u generalized snesorimotor polyneuropathy + bilateral carpal tunnel syndrome. Recommended continue alpha lipoic acid.    Thrombocytopenia (HCC) Assessment & Plan: Follow cbc. Platelet count has been stable.    Other orders -     Rosuvastatin Calcium; Take 1 tablet 2x/week.  Dispense: 26 tablet; Refill: 1     Dale [place], MD

## 2023-06-29 ENCOUNTER — Ambulatory Visit: Payer: Managed Care, Other (non HMO) | Admitting: Internal Medicine

## 2023-06-29 VITALS — BP 124/76 | HR 76 | Temp 98.0°F | Resp 16 | Ht 76.0 in | Wt 244.0 lb

## 2023-06-29 DIAGNOSIS — R42 Dizziness and giddiness: Secondary | ICD-10-CM

## 2023-06-29 DIAGNOSIS — R739 Hyperglycemia, unspecified: Secondary | ICD-10-CM | POA: Diagnosis not present

## 2023-06-29 DIAGNOSIS — K219 Gastro-esophageal reflux disease without esophagitis: Secondary | ICD-10-CM

## 2023-06-29 DIAGNOSIS — G4733 Obstructive sleep apnea (adult) (pediatric): Secondary | ICD-10-CM

## 2023-06-29 DIAGNOSIS — D696 Thrombocytopenia, unspecified: Secondary | ICD-10-CM

## 2023-06-29 DIAGNOSIS — G629 Polyneuropathy, unspecified: Secondary | ICD-10-CM

## 2023-06-29 DIAGNOSIS — M542 Cervicalgia: Secondary | ICD-10-CM

## 2023-06-29 DIAGNOSIS — I10 Essential (primary) hypertension: Secondary | ICD-10-CM

## 2023-06-29 DIAGNOSIS — R972 Elevated prostate specific antigen [PSA]: Secondary | ICD-10-CM

## 2023-06-29 DIAGNOSIS — E785 Hyperlipidemia, unspecified: Secondary | ICD-10-CM | POA: Diagnosis not present

## 2023-06-29 DIAGNOSIS — N400 Enlarged prostate without lower urinary tract symptoms: Secondary | ICD-10-CM | POA: Diagnosis not present

## 2023-06-29 MED ORDER — ROSUVASTATIN CALCIUM 10 MG PO TABS: ORAL_TABLET | ORAL | 1 refills | Status: AC

## 2023-06-29 MED ORDER — AMLODIPINE BESYLATE 5 MG PO TABS: 5.0000 mg | ORAL_TABLET | Freq: Every day | ORAL | 1 refills | Status: AC

## 2023-07-02 ENCOUNTER — Other Ambulatory Visit: Payer: Self-pay

## 2023-07-02 DIAGNOSIS — Z87898 Personal history of other specified conditions: Secondary | ICD-10-CM

## 2023-07-05 ENCOUNTER — Encounter: Payer: Self-pay | Admitting: Internal Medicine

## 2023-07-05 NOTE — Assessment & Plan Note (Signed)
 Saw neurology 04/20/23 - f/u generalized snesorimotor polyneuropathy + bilateral carpal tunnel syndrome. Recommended continue alpha lipoic acid.

## 2023-07-05 NOTE — Assessment & Plan Note (Signed)
 No acid reflux on protonix. Follow. Hold on changing.

## 2023-07-05 NOTE — Assessment & Plan Note (Signed)
 Continue lipitor.  Low cholesterol diet and exercise.  Follow lipid panel and liver function tests.  Lab Results  Component Value Date   CHOL 184 06/15/2023   HDL 48.70 06/15/2023   LDLCALC 122 (H) 06/15/2023   TRIG 62.0 06/15/2023   CHOLHDL 4 06/15/2023

## 2023-07-05 NOTE — Assessment & Plan Note (Signed)
 Continue amlodipine.  Follow pressures.  Follow metabolic panel. No changes in medication today.

## 2023-07-05 NOTE — Assessment & Plan Note (Signed)
 Saw neurology 04/20/23 - f/u generalized snesorimotor polyneuropathy + bilateral carpal tunnel syndrome. Recommended continue alpha lipoic acid. Also f/u dizziness with negative ENT w/up. Dix Hallpike's - normal. VNG normal. MRI 03/2022 - no acute intracranial abnormality. Continue magnesium. Also f/u cervicalgia and msk neck pain. Recommended MRI c-spine (not performed) and PT. Follow up on getting PT scheduled.

## 2023-07-05 NOTE — Assessment & Plan Note (Signed)
 Low carb diet and exercise.  Follow met b and a1c.

## 2023-07-05 NOTE — Assessment & Plan Note (Signed)
 Sees urology for BPH. On flomax. Saw urology 04/06/23 - f/u on increased psa. Recheck 1.6. recommended 3 month f/u.

## 2023-07-05 NOTE — Assessment & Plan Note (Signed)
Follow cbc.  Platelet count has been stable.   

## 2023-07-05 NOTE — Assessment & Plan Note (Signed)
 CPAP.

## 2023-07-13 ENCOUNTER — Other Ambulatory Visit: Payer: Managed Care, Other (non HMO)

## 2023-07-13 DIAGNOSIS — Z87898 Personal history of other specified conditions: Secondary | ICD-10-CM

## 2023-07-14 DIAGNOSIS — R972 Elevated prostate specific antigen [PSA]: Secondary | ICD-10-CM

## 2023-07-14 DIAGNOSIS — N401 Enlarged prostate with lower urinary tract symptoms: Secondary | ICD-10-CM

## 2023-07-14 LAB — PSA: Prostate Specific Ag, Serum: 2.5 ng/mL (ref 0.0–4.0)

## 2023-07-15 ENCOUNTER — Telehealth: Payer: Self-pay | Admitting: Internal Medicine

## 2023-07-15 NOTE — Telephone Encounter (Signed)
 Called neurology to f/u regarding physical therapy for Clifford Alexander. Order was placed and faxed. They will f/u with Dr Mason Sole.  Left message for Clifford Alexander notifying of above and to let me know if any problems.

## 2023-08-04 MED ORDER — SILODOSIN 8 MG PO CAPS: 8.0000 mg | ORAL_CAPSULE | Freq: Every day | ORAL | 11 refills | Status: AC

## 2023-08-04 NOTE — Addendum Note (Signed)
 Addended by: Mayford Alberg P on: 08/04/2023 12:59 PM   Modules accepted: Orders

## 2023-08-10 ENCOUNTER — Ambulatory Visit: Attending: Neurology

## 2023-08-10 DIAGNOSIS — M542 Cervicalgia: Secondary | ICD-10-CM | POA: Diagnosis present

## 2023-08-10 DIAGNOSIS — R42 Dizziness and giddiness: Secondary | ICD-10-CM | POA: Insufficient documentation

## 2023-08-10 DIAGNOSIS — R2681 Unsteadiness on feet: Secondary | ICD-10-CM | POA: Diagnosis present

## 2023-08-10 NOTE — Therapy (Signed)
 OUTPATIENT PHYSICAL THERAPY VESTIBULAR EVALUATION     Patient Name: SAYED PIWOWAR MRN: [id number] DOB:27-Nov-1965, 58 y.o., male Today's Date: 08/10/2023  END OF SESSION:  PT End of Session - 08/10/23 1034     Visit Number 1    Number of Visits 25    Date for PT Re-Evaluation 11/02/23    PT Start Time 0806    PT Stop Time 0846    PT Time Calculation (min) 40 min    Activity Tolerance Patient tolerated treatment well    Behavior During Therapy [facility] for tasks assessed/performed             Past Medical History:  Diagnosis Date   Bradycardia    CPAP (continuous positive airway pressure) dependence    Fatigue 06/23/2008   Hyperlipidemia    Hypertension    Insomnia 04/19/2007   Neuromuscular disorder (HCC)    neuropathy in feet   Shingles 01/2016   Sleep apnea    Thrombocytopenia (HCC)    Past Surgical History:  Procedure Laterality Date   LUMBAR LAMINECTOMY  04/22/2007   L4-L5   discectomy 03/2008   Patient Active Problem List   Diagnosis Date Noted   Elevated PSA 04/06/2023   Right hip pain 02/27/2023   Viral illness 06/15/2022   BPH (benign prostatic hyperplasia) 02/10/2022   Vision changes 02/02/2022   Knee pain, left 09/30/2021   Light headedness 02/26/2021   Neck pain 02/25/2021   Hernia, inguinal 02/26/2020   Thrombocytopenia (HCC) 10/09/2018   Renal cyst 10/09/2018   OSA (obstructive sleep apnea) 09/26/2017   Headache 09/26/2017   Polyneuropathy 07/18/2017   Abdominal pain 07/13/2017   CTS (carpal tunnel syndrome) 02/05/2017   Numbness in feet 10/27/2016   Vitamin D  deficiency 10/27/2016   Health care maintenance 11/19/2015   Acute bilateral low back pain without sciatica 07/18/2015   History of hay fever 10/23/2014   GERD (gastroesophageal reflux disease) 10/23/2014   Cephalalgia 10/23/2014   Dysmetabolic syndrome 10/23/2014   Hyperglycemia 10/23/2014   Hyperlipidemia 10/23/2014   External thrombosed hemorrhoids 04/06/2008   Combined fat  and carbohydrate induced hyperlipemia 02/14/2008   Cannot sleep 04/19/2007   Benign essential HTN 08/05/2006    PCP:  Dellar Fenton, MD  REFERRING PROVIDER: Baldemar Bond, FNP   REFERRING DIAG:  Diagnosis  M54.2 (ICD-10-CM) - Cervicalgia  R42 (ICD-10-CM) - Dizziness    THERAPY DIAG:  Dizziness and giddiness  Cervicalgia  Unsteadiness on feet  ONSET DATE: onset 2022  Rationale for Evaluation and Treatment: Rehabilitation  SUBJECTIVE:   SUBJECTIVE STATEMENT:  The pt is a pleasant 58 y/o male referred to PT for dizziness and cervicalgia. Per pt and referral he was seen by ENT 1-2 years ago wit negative work-up: VNG and Dix Hallpike testing.  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 He reports turning his head while driving is bothersome/can cause his symptoms.  Pt reports onset of dizzy symptoms in 2022. One morning he got up and felt dizzy, says he felt unstable but no spinning. He reports he has never had spinning. He also describes dizziness as lightheaded. He notices when he gets up quickly this triggers his symptoms. This also happens when he stands up quickly, especially after sitting for along time. Dizziness lasts for a few seconds. He reports his symptoms are mild. Symptoms have remained the same since this started. Pt thinks prostate medication might be affecting him as e reports a side effect is dizziness.  He notices he is unbalanced with his eyes closed. He has not fallen yet/no falls in the last six months. Pt reports he cannot get cervical MRI per his insurance until he tries PT. Pt reports he has not taken BP medication in about three months; says he is supposed to take this 1x/daily. PT instructs pt to follow his physician's instructions/prescription for taking his BP meds.   Pt reports no ringing in his ears, no ear fullness, no light or sound sensitivity, no car sickness, no issues with screens or busy stores. He does report  moving his head side-to -side makes him lightheaded. Tilting head up or down does not bring on symptoms.     Pt accompanied by: self  PERTINENT HISTORY: Per chart bradycardia, CPAP, HTN, HLD, neuromuscular disorder, sleep apnea, generalized sensorimotor polyneuropathy.  PAIN:  Are you having pain? Reports hx of neck spurs, reports one day he can be fine next day he can have posterior neck pain. Turning his head makes his neck sore  PRECAUTIONS: None  RED FLAGS: None   WEIGHT BEARING RESTRICTIONS: No  FALLS: Has patient fallen in last 6 months? No  LIVING ENVIRONMENT: deferred  PLOF: Independent  PATIENT GOALS: Do away with the dizziness  OBJECTIVE:  Note: Objective measures were completed at Evaluation unless otherwise noted.  DIAGNOSTIC FINDINGS: via chart  02/25/21 DG Cervical spine " FINDINGS: Cervical spine is visualized from the skull base through the cervicothoracic junction. Prevertebral soft tissues are within normal limits. Uncovertebral disease is most evident at C4-5 and C5-6 with loss of disc height at both of these levels. Uncovertebral disease is worse on the right. No acute or healing fractures are present. The lung apices are clear.   IMPRESSION: 1. No acute abnormality. 2. Uncovertebral disease and loss of disc height is greatest at C4-5 and C5-6.     Electronically Signed   By: Audree Leas M.D.   On: 02/25/2021 09:19"  COGNITION: Overall cognitive status: Within functional limits for tasks assessed   SENSATION: Reports carpal tunnel bilat hands, reports not normally have n/t in feet   EDEMA:  Reports none    POSTURE:  rounded shoulders  Cervical ROM:    Active A/PROM (deg) eval  Flexion 60 deg*  Extension 55 deg  Right lateral flexion 45  Left lateral flexion 36  Right rotation ~80  Left rotation ~80  (Blank rows = not tested) *=pain limited, felt in posterior neck   STRENGTH: not assessed   BED MOBILITY:  Pt  does report some dizziness rolling to R, gets a bit lightheaded (mild symptoms)  TRANSFERS: Assistive device utilized: None  Sit to stand: Complete Independence Stand to sit: Complete Independence Chair to chair: Complete Independence    GAIT: Gait pattern: not formally assessed on this date, however, pt ambulates without AD, scanning likely impaired as pt reports he becomes symptomatic with quick head turns   FUNCTIONAL TESTS:  Dynamic Gait Index: deferred  PATIENT SURVEYS:  DHI 18 = low perception of handicap  VESTIBULAR ASSESSMENT:  OCULOMOTOR EXAM:  Ocular Alignment: normal  Ocular ROM: No Limitations - but slight dizziness at extremes with testing   Spontaneous Nystagmus: absent  Gaze-Induced Nystagmus: absent  End-Gaze nystagmus: possibly present and likey WNL for age, does make pt dizzy with R end gaze   Smooth Pursuits: saccades likely WNL for age   Saccades: appears to have corrective saccades bilat - reports faint dizziness with testing, may benefit from repeat testing future visit  Convergence/Divergence: WNL movement - reports slight dizziness  Comments: Pt does report this is same kind of dizziness he feels with sit>stand, described as a lightheaded sensation    VESTIBULAR - OCULAR REFLEX: deferred  Slow VOR:   VOR Cancellation:   Head-Impulse Test:   Dynamic Visual Acuity:    POSITIONAL TESTING: deferred    OTHOSTATICS:   Supine: 153/88 mmHg HR 56 bpm  Seated: 164/93 mmHg HR 55 Reports some dizziness  Seated: 165/102 mmHg HR 62 bpm  Repeated standing after 2 min: 158/102 mmHg  Pt reports he has not been taking his BP med, has not taken it in 3 months.  PT advised pt to follow physiican instruction to take his BP. Provides education on signs/symptoms to watch for and when to seek emergency care (worsening headache, vertigo/vision change, increased unsteadiness, new n/t). PT also advises pt to continue to monitor BP at home. Pt reports BP when taken at  home is typically lower than BP #s here today. Comments: somewhat lightehaded throughout, slight headache (instructed to monitor at home, if worsens seek emergency care)                                                                                                                             TREATMENT DATE:   TE: Verbal review with demo of the following intervention for his HEP: Access Code: [email or web address] URL: [email or web address] Date: 08/10/2023 Prepared by: Aminta Kales  Exercises - Seated Upper Trapezius Stretch  - 1 x daily - 7 x weekly - 2 sets - 1 reps - 30 seconds hold  Self-Care/Home management. PT also reviewed importance of following his physicians instruction regarding his BP medication, signs and symptoms to watch for and when to seek emergency care. PT reviewed findings of assessment, plan for next visit.  PATIENT EDUCATION: Education details: see above, exam findings, additionally, plan, goals Person educated: Patient Education method: Explanation, demo, printout Education comprehension: verbalized understanding  HOME EXERCISE PROGRAM:  Access Code: [email or web address] URL: [email or web address] Date: 08/10/2023 Prepared by: Aminta Kales  Exercises - Seated Upper Trapezius Stretch  - 1 x daily - 7 x weekly - 2 sets - 1 reps - 30 seconds hold    GOALS: Goals reviewed with patient? Yes initiated  SHORT TERM GOALS: Target date: 09/21/2023   Patient will be independent in home exercise program to improve balance, dizziness and mobility for better functional independence with ADLs. Baseline:initiated  Goal status: INITIAL   LONG TERM GOALS: Target date: 11/02/2023      1.  Patient will reduce dizziness handicap inventory score by 10 points for less dizziness with ADLs and increased safety with home and work tasks.  Baseline: 18 Goal status: INITIAL  2.  Pt will demonstrate ability to complete WNL cervical flexion AROM without posterior neck  pain for improved mobility and QOL  Baseline: 60 deg and pain-limited,felt posterior neck Goal status: INITIAL  3.  Patient will increase dynamic gait index score to >  19/24 as to demonstrate reduced fall risk and improved dynamic gait balance for better safety with community/home ambulation.   Baseline:  Goal status: INITIAL  3.  Patient will demo and report ability to complete quick horizontal head movements without dizziness for improved ease with driving and scanning with gait.  Baseline: quick head turns trigger pt's symptoms  Goal status: INITIAL    ASSESSMENT:  CLINICAL IMPRESSION: Patient is a pleasant 58 y.o. male who was seen today for physical therapy evaluation and treatment for dizziness. Exam findings indicate cervical AROM limitations, cervicalgia, dizziness with oculomotor exam, although majority of ocular movement appears WNL, with the exception of saccades which were impaired bilaterally. Orthostatics were assessed as pt described his dizziness as a lightheaded feeling mostly triggered by sit>stand, especially after prolonged sitting. Pt did not exhibit abnormal BP drop, but instead did exhibit high BP. Pt reported to PT during eval that he had not taken his BP medication as prescribed for the past three months. PT advised pt to follow physicians instruction to take his BP medication. Additional education provided on emergency signs as symptoms to watch for and when to seek emergency care, and PT also advised for pt to track BP at home. The pt will benefit from further skilled PT to improve dizziness and neck pain in order to increase QOL and ease with mobility/work demands.  OBJECTIVE IMPAIRMENTS: decreased balance, decreased mobility, decreased ROM, dizziness, impaired sensation, postural dysfunction, and pain.   ACTIVITY LIMITATIONS: transfers, bed mobility, and locomotion level  PARTICIPATION LIMITATIONS: driving, community activity, and occupation  PERSONAL FACTORS:  Profession, Time since onset of injury/illness/exacerbation, and 3+ comorbidities: Per chart bradycardia, CPAP, HTN, HLD, neuromuscular disorder, sleep apnea, generalized sensorimotor polyneuropathy. are also affecting patient's functional outcome.   REHAB POTENTIAL: Good  CLINICAL DECISION MAKING: Evolving/moderate complexity  EVALUATION COMPLEXITY: Moderate   PLAN:  PT FREQUENCY: 1-2x/week  PT DURATION: 12 weeks  PLANNED INTERVENTIONS: 97164- PT Re-evaluation, 97750- Physical Performance Testing, 97110-Therapeutic exercises, 97530- Therapeutic activity, [id number]- Neuromuscular re-education, 97535- Self Care, 16109- Manual therapy, [id number]- Gait training, [phone]- Orthotic Initial, [phone]- Orthotic/Prosthetic subsequent, [phone]- Canalith repositioning, Patient/Family education, Balance training, Stair training, Taping, Dry Needling, Joint mobilization, Spinal mobilization, Vestibular training, DME instructions, Cryotherapy, and Moist heat  PLAN FOR NEXT SESSION: VOR tests, DGI, HEP, balance, manual, positional test screen with goggles as time permits, reassess BP    Samie Crews, PT 08/10/2023, 12:41 PM

## 2023-08-31 ENCOUNTER — Ambulatory Visit
Admission: RE | Admit: 2023-08-31 | Discharge: 2023-08-31 | Disposition: A | Discharge status: Home / Self Care | Source: Ambulatory Visit | Attending: Physician Assistant | Admitting: Physician Assistant

## 2023-08-31 DIAGNOSIS — R972 Elevated prostate specific antigen [PSA]: Secondary | ICD-10-CM | POA: Diagnosis present

## 2023-08-31 MED ORDER — GADOBUTROL 1 MMOL/ML IV SOLN
10.0000 mL | Freq: Once | INTRAVENOUS | Status: AC | PRN
  Administered 2023-08-31: 10 mL via INTRAVENOUS

## 2023-09-03 ENCOUNTER — Ambulatory Visit: Payer: Self-pay | Admitting: Physician Assistant

## 2023-09-07 ENCOUNTER — Ambulatory Visit

## 2023-09-14 ENCOUNTER — Ambulatory Visit: Attending: Neurology

## 2023-09-14 DIAGNOSIS — R262 Difficulty in walking, not elsewhere classified: Secondary | ICD-10-CM | POA: Insufficient documentation

## 2023-09-14 DIAGNOSIS — M542 Cervicalgia: Secondary | ICD-10-CM | POA: Diagnosis present

## 2023-09-14 DIAGNOSIS — R42 Dizziness and giddiness: Secondary | ICD-10-CM | POA: Insufficient documentation

## 2023-09-14 NOTE — Therapy (Signed)
 OUTPATIENT PHYSICAL THERAPY VESTIBULAR TREATMENT     Patient Name: Clifford Alexander MRN: [id number] DOB:1965/11/08, 58 y.o., male Today's Date: 09/14/2023  END OF SESSION:  PT End of Session - 09/14/23 1241     Visit Number 2    Number of Visits 25    Date for PT Re-Evaluation 11/02/23    PT Start Time 1020    PT Stop Time 1100    PT Time Calculation (min) 40 min    Equipment Utilized During Treatment Gait belt    Activity Tolerance Patient tolerated treatment well    Behavior During Therapy WFL for tasks assessed/performed           Past Medical History:  Diagnosis Date   Bradycardia    CPAP (continuous positive airway pressure) dependence    Fatigue 06/23/2008   Hyperlipidemia    Hypertension    Insomnia 04/19/2007   Neuromuscular disorder (HCC)    neuropathy in feet   Shingles 01/2016   Sleep apnea    Thrombocytopenia (HCC)    Past Surgical History:  Procedure Laterality Date   LUMBAR LAMINECTOMY  04/22/2007   L4-L5   discectomy 03/2008   Patient Active Problem List   Diagnosis Date Noted   Elevated PSA 04/06/2023   Right hip pain 02/27/2023   Viral illness 06/15/2022   BPH (benign prostatic hyperplasia) 02/10/2022   Vision changes 02/02/2022   Knee pain, left 09/30/2021   Light headedness 02/26/2021   Neck pain 02/25/2021   Hernia, inguinal 02/26/2020   Thrombocytopenia (HCC) 10/09/2018   Renal cyst 10/09/2018   OSA (obstructive sleep apnea) 09/26/2017   Headache 09/26/2017   Polyneuropathy 07/18/2017   Abdominal pain 07/13/2017   CTS (carpal tunnel syndrome) 02/05/2017   Numbness in feet 10/27/2016   Vitamin D  deficiency 10/27/2016   Health care maintenance 11/19/2015   Acute bilateral low back pain without sciatica 07/18/2015   History of hay fever 10/23/2014   GERD (gastroesophageal reflux disease) 10/23/2014   Cephalalgia 10/23/2014   Dysmetabolic syndrome 10/23/2014   Hyperglycemia 10/23/2014   Hyperlipidemia 10/23/2014   External  thrombosed hemorrhoids 04/06/2008   Combined fat and carbohydrate induced hyperlipemia 02/14/2008   Cannot sleep 04/19/2007   Benign essential HTN 08/05/2006    PCP:  Dellar Fenton, MD  REFERRING PROVIDER: Baldemar Bond, FNP   REFERRING DIAG:  Diagnosis  M54.2 (ICD-10-CM) - Cervicalgia  R42 (ICD-10-CM) - Dizziness    THERAPY DIAG:  Dizziness and giddiness  ONSET DATE: onset 2022  Rationale for Evaluation and Treatment: Rehabilitation  SUBJECTIVE:   SUBJECTIVE STATEMENT: 09/14/23: Pt reports symptoms have been about the same. He reports he restarted his BP medication. When he's active he doesn't really notice symptoms, but when he's at rest he notices them.   From Eval:  The pt is a pleasant 58 y/o male referred to PT for dizziness and cervicalgia. Per pt and referral he was seen by ENT 1-2 years ago wit negative work-up: VNG and [employer] [place] testing.  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 He reports turning his head while driving is bothersome/can cause his symptoms.  Pt reports onset of dizzy symptoms in 2022. One morning he got up and felt dizzy, says he felt unstable but no spinning. He reports he has never had spinning. He also describes dizziness as lightheaded. He notices when he gets up quickly this triggers his symptoms. This also happens when he stands up quickly, especially after sitting for along  time. Dizziness lasts for a few seconds. He reports his symptoms are mild. Symptoms have remained the same since this started. Pt thinks prostate medication might be affecting him as e reports a side effect is dizziness. He notices he is unbalanced with his eyes closed. He has not fallen yet/no falls in the last six months. Pt reports he cannot get cervical MRI per his insurance until he tries PT. Pt reports he has not taken BP medication in about three months; says he is supposed to take this 1x/daily. PT instructs pt to follow his  physician's instructions/prescription for taking his BP meds.   Pt reports no ringing in his ears, no ear fullness, no light or sound sensitivity, no car sickness, no issues with screens or busy stores. He does report moving his head side-to -side makes him lightheaded. Tilting head up or down does not bring on symptoms.     Pt accompanied by: self  PERTINENT HISTORY: Per chart bradycardia, CPAP, HTN, HLD, neuromuscular disorder, sleep apnea, generalized sensorimotor polyneuropathy.  PAIN:  Are you having pain? Reports hx of neck spurs, reports one day he can be fine next day he can have posterior neck pain. Turning his head makes his neck sore  PRECAUTIONS: None  RED FLAGS: None   WEIGHT BEARING RESTRICTIONS: No  FALLS: Has patient fallen in last 6 months? No  LIVING ENVIRONMENT: deferred  PLOF: Independent  PATIENT GOALS: Do away with the dizziness  OBJECTIVE:  Note: Objective measures were completed at Evaluation unless otherwise noted.  DIAGNOSTIC FINDINGS: via chart  02/25/21 DG Cervical spine  FINDINGS: Cervical spine is visualized from the skull base through the cervicothoracic junction. Prevertebral soft tissues are within normal limits. Uncovertebral disease is most evident at C4-5 and C5-6 with loss of disc height at both of these levels. Uncovertebral disease is worse on the right. No acute or healing fractures are present. The lung apices are clear.   IMPRESSION: 1. No acute abnormality. 2. Uncovertebral disease and loss of disc height is greatest at C4-5 and C5-6.     Electronically Signed   By: Audree Leas M.D.   On: 02/25/2021 09:19  COGNITION: Overall cognitive status: Within functional limits for tasks assessed   SENSATION: Reports carpal tunnel bilat hands, reports not normally have n/t in feet   EDEMA:  Reports none    POSTURE:  rounded shoulders  Cervical ROM:    Active A/PROM (deg) eval  Flexion 60 deg*  Extension  55 deg  Right lateral flexion 45  Left lateral flexion 36  Right rotation ~80  Left rotation ~80  (Blank rows = not tested) *=pain limited, felt in posterior neck   STRENGTH: not assessed   BED MOBILITY:  Pt does report some dizziness rolling to R, gets a bit lightheaded (mild symptoms)  TRANSFERS: Assistive device utilized: None  Sit to stand: Complete Independence Stand to sit: Complete Independence Chair to chair: Complete Independence    GAIT: Gait pattern: not formally assessed on this date, however, pt ambulates without AD, scanning likely impaired as pt reports he becomes symptomatic with quick head turns   FUNCTIONAL TESTS:  Dynamic Gait Index: deferred  PATIENT SURVEYS:  DHI 18 = low perception of handicap  VESTIBULAR ASSESSMENT:  OCULOMOTOR EXAM:  Ocular Alignment: normal  Ocular ROM: No Limitations - but slight dizziness at extremes with testing   Spontaneous Nystagmus: absent  Gaze-Induced Nystagmus: absent  End-Gaze nystagmus: possibly present and likey WNL for age, does make pt dizzy  with R end gaze   Smooth Pursuits: saccades likely WNL for age   Saccades: appears to have corrective saccades bilat - reports faint dizziness with testing, may benefit from repeat testing future visit   Convergence/Divergence: WNL movement - reports slight dizziness  Comments: Pt does report this is same kind of dizziness he feels with sit>stand, described as a lightheaded sensation    VESTIBULAR - OCULAR REFLEX: deferred  Slow VOR:   VOR Cancellation:   Head-Impulse Test:   Dynamic Visual Acuity:    POSITIONAL TESTING: deferred    OTHOSTATICS:   Supine: 153/88 mmHg HR 56 bpm  Seated: 164/93 mmHg HR 55 Reports some dizziness  Seated: 165/102 mmHg HR 62 bpm  Repeated standing after 2 min: 158/102 mmHg  Pt reports he has not been taking his BP med, has not taken it in 3 months.  PT advised pt to follow physiican instruction to take his BP. Provides education on  signs/symptoms to watch for and when to seek emergency care (worsening headache, vertigo/vision change, increased unsteadiness, new n/t). PT also advises pt to continue to monitor BP at home. Pt reports BP when taken at home is typically lower than BP #s here today. Comments: somewhat lightehaded throughout, slight headache (instructed to monitor at home, if worsens seek emergency care)                                                                                                                             TREATMENT DATE:   CRM: x 26 min Testing completed today in room light- Roll test: negative bilat R DH: slight lightheadedness L DH: more intense lightheadedness (delayed onset) No nystagmus observed throughout  Left Epley 2x - pt with some lightheaded symptoms once head turned to R in second position, but with second maneuver pt did not have lightheadedness in initial Epley position, however still present in second position.  NMR: VESTIBULAR - OCULAR REFLEX:   Slow VOR: WNL  VOR Cancellation: WNL  Head-Impulse Test: WNL movement but does trigger pt to feel mildly lightheaded, but reports this is the most intense it has felt all day   Seated BP: 145/96 mmHg HR 56 bpm   Seated horizontal head turns 2x10 - makes pt mildly symptomatic   Updated HEP Access Code: [email or web address] URL: [email or web address] Date: 09/14/2023 Prepared by: Aminta Kales  Exercises - Seated Upper Trapezius Stretch  - 1 x daily - 7 x weekly - 2 sets - 1 reps - 30 seconds hold - Vestibular Habituation - Seated Horizontal Head Rotation  - 1 x daily - 7 x weekly - 2 sets - 10 reps  PATIENT EDUCATION: Education details: see above, exam findings, additionally, plan, goals Person educated: Patient Education method: Explanation, demo, printout Education comprehension: verbalized understanding  HOME EXERCISE PROGRAM:  Access Code: [email or web address] URL: [email or web address] Date:  08/10/2023 Prepared by: Aminta Kales  Exercises - Seated Upper Trapezius Stretch  - 1 x daily - 7 x weekly -  2 sets - 1 reps - 30 seconds hold    GOALS: Goals reviewed with patient? Yes initiated  SHORT TERM GOALS: Target date: 10/26/2023   Patient will be independent in home exercise program to improve balance, dizziness and mobility for better functional independence with ADLs. Baseline:initiated  Goal status: INITIAL   LONG TERM GOALS: Target date: 12/07/2023      1.  Patient will reduce dizziness handicap inventory score by 10 points for less dizziness with ADLs and increased safety with home and work tasks.  Baseline: 18 Goal status: INITIAL  2.  Pt will demonstrate ability to complete WNL cervical flexion AROM without posterior neck pain for improved mobility and QOL  Baseline: 60 deg and pain-limited,felt posterior neck Goal status: INITIAL  3.  Patient will increase dynamic gait index score to >19/24 as to demonstrate reduced fall risk and improved dynamic gait balance for better safety with community/home ambulation.   Baseline:  Goal status: INITIAL  3.  Patient will demo and report ability to complete quick horizontal head movements without dizziness for improved ease with driving and scanning with gait.  Baseline: quick head turns trigger pt's symptoms  Goal status: INITIAL    ASSESSMENT:  CLINICAL IMPRESSION: Positional testing completed in room light. No nystagmus observed at this time, but pt consistently with delayed-onset symptoms with L DH. Epley to tx L side provided 2x. Pt would benefit from repeat testing with vestibular goggles future visit. VOR testing WNL, exception is that head impulse test did make pt symptomatic although movement was normal. PT added vestibular habituation exercise to HEP to address possible motion sensitivity. Additionally, PT instructed pt to continue to monitor BP at home. The pt will benefit from further skilled PT to improve  dizziness and neck pain in order to increase QOL and ease with mobility/work demands.  OBJECTIVE IMPAIRMENTS: decreased balance, decreased mobility, decreased ROM, dizziness, impaired sensation, postural dysfunction, and pain.   ACTIVITY LIMITATIONS: transfers, bed mobility, and locomotion level  PARTICIPATION LIMITATIONS: driving, community activity, and occupation  PERSONAL FACTORS: Profession, Time since onset of injury/illness/exacerbation, and 3+ comorbidities: Per chart bradycardia, CPAP, HTN, HLD, neuromuscular disorder, sleep apnea, generalized sensorimotor polyneuropathy. are also affecting patient's functional outcome.   REHAB POTENTIAL: Good  CLINICAL DECISION MAKING: Evolving/moderate complexity  EVALUATION COMPLEXITY: Moderate   PLAN:  PT FREQUENCY: 1-2x/week  PT DURATION: 12 weeks  PLANNED INTERVENTIONS: 97164- PT Re-evaluation, 97750- Physical Performance Testing, 97110-Therapeutic exercises, 97530- Therapeutic activity, [id number]- Neuromuscular re-education, 97535- Self Care, [id number]- Manual therapy, [id number]- Gait training, [phone]- Orthotic Initial, [phone]- Orthotic/Prosthetic subsequent, [phone]- Canalith repositioning, Patient/Family education, Balance training, Stair training, Taping, Dry Needling, Joint mobilization, Spinal mobilization, Vestibular training, DME instructions, Cryotherapy, and Moist heat  PLAN FOR NEXT SESSION: DGI,  balance, manual, positional test screen with goggles as time permits, reassess BP    Samie Crews, PT 09/14/2023, 12:48 PM

## 2023-09-21 ENCOUNTER — Encounter: Payer: Self-pay | Admitting: Internal Medicine

## 2023-09-21 ENCOUNTER — Ambulatory Visit

## 2023-09-28 ENCOUNTER — Ambulatory Visit

## 2023-09-28 DIAGNOSIS — R42 Dizziness and giddiness: Secondary | ICD-10-CM | POA: Diagnosis not present

## 2023-09-28 DIAGNOSIS — R262 Difficulty in walking, not elsewhere classified: Secondary | ICD-10-CM

## 2023-09-28 DIAGNOSIS — M542 Cervicalgia: Secondary | ICD-10-CM

## 2023-09-28 NOTE — Therapy (Signed)
 OUTPATIENT PHYSICAL THERAPY VESTIBULAR TREATMENT     Patient Name: Clifford Alexander MRN: [id number] DOB:[birth year], 58 y.o., male Today's Date: 09/28/2023  END OF SESSION:  PT End of Session - 09/28/23 1033     Visit Number 3    Number of Visits 25    Date for PT Re-Evaluation 11/02/23    PT Start Time 0802    PT Stop Time 0845    PT Time Calculation (min) 43 min    Equipment Utilized During Treatment Gait belt    Activity Tolerance Patient tolerated treatment well    Behavior During Therapy WFL for tasks assessed/performed            Past Medical History:  Diagnosis Date   Bradycardia    CPAP (continuous positive airway pressure) dependence    Fatigue 06/23/2008   Hyperlipidemia    Hypertension    Insomnia 04/19/2007   Neuromuscular disorder (HCC)    neuropathy in feet   Shingles 01/2016   Sleep apnea    Thrombocytopenia (HCC)    Past Surgical History:  Procedure Laterality Date   LUMBAR LAMINECTOMY  04/22/2007   L4-L5   discectomy 03/2008   Patient Active Problem List   Diagnosis Date Noted   Elevated PSA 04/06/2023   Right hip pain 02/27/2023   Viral illness 06/15/2022   BPH (benign prostatic hyperplasia) 02/10/2022   Vision changes 02/02/2022   Knee pain, left 09/30/2021   Light headedness 02/26/2021   Neck pain 02/25/2021   Hernia, inguinal 02/26/2020   Thrombocytopenia (HCC) 10/09/2018   Renal cyst 10/09/2018   OSA (obstructive sleep apnea) 09/26/2017   Headache 09/26/2017   Polyneuropathy 07/18/2017   Abdominal pain 07/13/2017   CTS (carpal tunnel syndrome) 02/05/2017   Numbness in feet 10/27/2016   Vitamin D  deficiency 10/27/2016   Health care maintenance 11/19/2015   Acute bilateral low back pain without sciatica 07/18/2015   History of hay fever 10/23/2014   GERD (gastroesophageal reflux disease) 10/23/2014   Cephalalgia 10/23/2014   Dysmetabolic syndrome 10/23/2014   Hyperglycemia 10/23/2014   Hyperlipidemia 10/23/2014   External  thrombosed hemorrhoids 04/06/2008   Combined fat and carbohydrate induced hyperlipemia 02/14/2008   Cannot sleep 04/19/2007   Benign essential HTN 08/05/2006    PCP:  Glendia Shad, MD  REFERRING PROVIDER: Evern Allyson Hacker, FNP   REFERRING DIAG:  Diagnosis  M54.2 (ICD-10-CM) - Cervicalgia  R42 (ICD-10-CM) - Dizziness    THERAPY DIAG:  Dizziness and giddiness  Cervicalgia  Difficulty in walking, not elsewhere classified  ONSET DATE: onset 2022  Rationale for Evaluation and Treatment: Rehabilitation  SUBJECTIVE:   SUBJECTIVE STATEMENT: 09/28/23: Pt reports symptoms not quite as bad today, thinks CRM from last visit might have helped.  From Eval:  The pt is a pleasant 58 y/o male referred to PT for dizziness and cervicalgia. Per pt and referral he was seen by ENT 1-2 years ago wit negative work-up: VNG and Dix Hallpike testing.  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 He reports turning his head while driving is bothersome/can cause his symptoms.  Pt reports onset of dizzy symptoms in 2022. One morning he got up and felt dizzy, says he felt unstable but no spinning. He reports he has never had spinning. He also describes dizziness as lightheaded. He notices when he gets up quickly this triggers his symptoms. This also happens when he stands up quickly, especially after sitting for along time. Dizziness lasts for a few  seconds. He reports his symptoms are mild. Symptoms have remained the same since this started. Pt thinks prostate medication might be affecting him as e reports a side effect is dizziness. He notices he is unbalanced with his eyes closed. He has not fallen yet/no falls in the last six months. Pt reports he cannot get cervical MRI per his insurance until he tries PT. Pt reports he has not taken BP medication in about three months; says he is supposed to take this 1x/daily. PT instructs pt to follow his physician's  instructions/prescription for taking his BP meds.   Pt reports no ringing in his ears, no ear fullness, no light or sound sensitivity, no car sickness, no issues with screens or busy stores. He does report moving his head side-to -side makes him lightheaded. Tilting head up or down does not bring on symptoms.     Pt accompanied by: self  PERTINENT HISTORY: Per chart bradycardia, CPAP, HTN, HLD, neuromuscular disorder, sleep apnea, generalized sensorimotor polyneuropathy.  PAIN:  Are you having pain? Reports hx of neck spurs, reports one day he can be fine next day he can have posterior neck pain. Turning his head makes his neck sore  PRECAUTIONS: None  RED FLAGS: None   WEIGHT BEARING RESTRICTIONS: No  FALLS: Has patient fallen in last 6 months? No  LIVING ENVIRONMENT: deferred  PLOF: Independent  PATIENT GOALS: Do away with the dizziness  OBJECTIVE:  Note: Objective measures were completed at Evaluation unless otherwise noted.  DIAGNOSTIC FINDINGS: via chart  02/25/21 DG Cervical spine  FINDINGS: Cervical spine is visualized from the skull base through the cervicothoracic junction. Prevertebral soft tissues are within normal limits. Uncovertebral disease is most evident at C4-5 and C5-6 with loss of disc height at both of these levels. Uncovertebral disease is worse on the right. No acute or healing fractures are present. The lung apices are clear.   IMPRESSION: 1. No acute abnormality. 2. Uncovertebral disease and loss of disc height is greatest at C4-5 and C5-6.     Electronically Signed   By: Lonni Necessary M.D.   On: 02/25/2021 09:19  COGNITION: Overall cognitive status: Within functional limits for tasks assessed   SENSATION: Reports carpal tunnel bilat hands, reports not normally have n/t in feet   EDEMA:  Reports none    POSTURE:  rounded shoulders  Cervical ROM:    Active A/PROM (deg) eval  Flexion 60 deg*  Extension 55 deg   Right lateral flexion 45  Left lateral flexion 36  Right rotation ~80  Left rotation ~80  (Blank rows = not tested) *=pain limited, felt in posterior neck   STRENGTH: not assessed   BED MOBILITY:  Pt does report some dizziness rolling to R, gets a bit lightheaded (mild symptoms)  TRANSFERS: Assistive device utilized: None  Sit to stand: Complete Independence Stand to sit: Complete Independence Chair to chair: Complete Independence    GAIT: Gait pattern: not formally assessed on this date, however, pt ambulates without AD, scanning likely impaired as pt reports he becomes symptomatic with quick head turns   FUNCTIONAL TESTS:  Dynamic Gait Index: deferred  PATIENT SURVEYS:  DHI 18 = low perception of handicap  VESTIBULAR ASSESSMENT:  OCULOMOTOR EXAM:  Ocular Alignment: normal  Ocular ROM: No Limitations - but slight dizziness at extremes with testing   Spontaneous Nystagmus: absent  Gaze-Induced Nystagmus: absent  End-Gaze nystagmus: possibly present and likey WNL for age, does make pt dizzy with R end gaze  Smooth Pursuits: saccades likely WNL for age   Saccades: appears to have corrective saccades bilat - reports faint dizziness with testing, may benefit from repeat testing future visit   Convergence/Divergence: WNL movement - reports slight dizziness  Comments: Pt does report this is same kind of dizziness he feels with sit>stand, described as a lightheaded sensation    VESTIBULAR - OCULAR REFLEX: deferred  Slow VOR:   VOR Cancellation:   Head-Impulse Test:   Dynamic Visual Acuity:    POSITIONAL TESTING: deferred    OTHOSTATICS:   Supine: 153/88 mmHg HR 56 bpm  Seated: 164/93 mmHg HR 55 Reports some dizziness  Seated: 165/102 mmHg HR 62 bpm  Repeated standing after 2 min: 158/102 mmHg  Pt reports he has not been taking his BP med, has not taken it in 3 months.  PT advised pt to follow physiican instruction to take his BP. Provides education on  signs/symptoms to watch for and when to seek emergency care (worsening headache, vertigo/vision change, increased unsteadiness, new n/t). PT also advises pt to continue to monitor BP at home. Pt reports BP when taken at home is typically lower than BP #s here today. Comments: somewhat lightehaded throughout, slight headache (instructed to monitor at home, if worsens seek emergency care)                                                                                                                             TREATMENT DATE:   Physical performance: PT instructed pt in DGI. See below for results. Demonstrates increased fall risk with score of 22/24. (<19 indicates increased fall risk)    [facility] PT Assessment - 09/28/23 0001       Standardized Balance Assessment   Standardized Balance Assessment Dynamic Gait Index      Dynamic Gait Index   Level Surface Normal    Change in Gait Speed Normal    Gait with Horizontal Head Turns Mild Impairment    Gait with Vertical Head Turns Normal    Gait and Pivot Turn Mild Impairment   very mild lightheaded feeling, brief, slight sway   Step Over Obstacle Normal    Step Around Obstacles Normal    Steps Normal    Total Score 22          NMR: Fixation suppressed/goggles donned: Assessment of pt in primary and secondary gaze - no nystagmus throughout Dix-Hallpike: negative bilaterally   Gait with horizontal head turns: 4x30 sec rounds, pt turning head every 3-5 steps (added to HEP)  Seated BP: 144/86 mmHg HR 64 - improved compared to previous visits   Vestibular Habituation - Seated Horizontal Head Rotation  - 2 sets - 10 reps - still makes pt symptomatic   Provided addition to HEP and reviewed instruction in session: Access Code: [email or web address] URL: [email or web address] Date: 09/28/2023 Prepared by: Darryle Patten  Exercises - Walking with Head Rotation  - 1 x daily - 7 x weekly - 4 sets -  1 reps - 30 seconds hold - Seated Cervical  Extension AROM  - 1 x daily - 7 x weekly - 2 sets - 1 reps - 30 seconds hold   TA: to promote tissue-extensibility, mobility, symptom modulation  Seated UT stretch 2x30 sec each side Seated stretch of cervical flexors  2x30 sec Seated cervical extensors and rhomboids stretch 2x30 sec Scapular squeezes 10x with 3 sec hold/rep      PATIENT EDUCATION: Education details: see above, exam findings, additionally, plan, goals Person educated: Patient Education method: Explanation, demo, printout Education comprehension: verbalized understanding  HOME EXERCISE PROGRAM:  Access Code: [email or web address] URL: [email or web address] Date: 08/10/2023 Prepared by: Darryle Patten  Exercises - Seated Upper Trapezius Stretch  - 1 x daily - 7 x weekly - 2 sets - 1 reps - 30 seconds hold    GOALS: Goals reviewed with patient? Yes initiated  SHORT TERM GOALS: Target date: 11/09/2023   Patient will be independent in home exercise program to improve balance, dizziness and mobility for better functional independence with ADLs. Baseline:initiated  Goal status: INITIAL   LONG TERM GOALS: Target date: 12/21/2023      1.  Patient will reduce dizziness handicap inventory score by 10 points for less dizziness with ADLs and increased safety with home and work tasks.  Baseline: 18 Goal status: INITIAL  2.  Pt will demonstrate ability to complete WNL cervical flexion AROM without posterior neck pain for improved mobility and QOL  Baseline: 60 deg and pain-limited,felt posterior neck Goal status: INITIAL  3.  Patient will increase dynamic gait index score to >19/24 as to demonstrate reduced fall risk and improved dynamic gait balance for better safety with community/home ambulation.   Baseline:  Goal status: INITIAL  3.  Patient will demo and report ability to complete quick horizontal head movements without dizziness for improved ease with driving and scanning with gait.  Baseline: quick head  turns trigger pt's symptoms  Goal status: INITIAL    ASSESSMENT:  CLINICAL IMPRESSION: Positional testing completed with vestibular goggles and found to be negative bilat with no symptoms. Pt does continue to be somewhat symptomatic with gait with head turns and gaze stabilization exercises; HEP updated to address these deficits and also add another intervention to target cervical deficits that may be contributing to symptoms. The pt will benefit from further skilled PT to improve dizziness and neck pain in order to increase QOL and ease with mobility/work demands.  OBJECTIVE IMPAIRMENTS: decreased balance, decreased mobility, decreased ROM, dizziness, impaired sensation, postural dysfunction, and pain.   ACTIVITY LIMITATIONS: transfers, bed mobility, and locomotion level  PARTICIPATION LIMITATIONS: driving, community activity, and occupation  PERSONAL FACTORS: Profession, Time since onset of injury/illness/exacerbation, and 3+ comorbidities: Per chart bradycardia, CPAP, HTN, HLD, neuromuscular disorder, sleep apnea, generalized sensorimotor polyneuropathy. are also affecting patient's functional outcome.   REHAB POTENTIAL: Good  CLINICAL DECISION MAKING: Evolving/moderate complexity  EVALUATION COMPLEXITY: Moderate   PLAN:  PT FREQUENCY: 1-2x/week  PT DURATION: 12 weeks  PLANNED INTERVENTIONS: 97164- PT Re-evaluation, 97750- Physical Performance Testing, 97110-Therapeutic exercises, 97530- Therapeutic activity, [id number]- Neuromuscular re-education, 97535- Self Care, [id number]- Manual therapy, [id number]- Gait training, [phone]- Orthotic Initial, [phone]- Orthotic/Prosthetic subsequent, [phone]- Canalith repositioning, Patient/Family education, Balance training, Stair training, Taping, Dry Needling, Joint mobilization, Spinal mobilization, Vestibular training, DME instructions, Cryotherapy, and Moist heat  PLAN FOR NEXT SESSION: gaze stabilization, habituation,  balance, manual, reassess BP    Darryle JONELLE Patten, PT 09/28/2023, 10:34 AM

## 2023-10-05 ENCOUNTER — Ambulatory Visit

## 2023-10-09 ENCOUNTER — Ambulatory Visit: Admission: EM | Admit: 2023-10-09 | Discharge: 2023-10-09 | Disposition: A | Discharge status: Home / Self Care

## 2023-10-09 DIAGNOSIS — J069 Acute upper respiratory infection, unspecified: Secondary | ICD-10-CM | POA: Diagnosis not present

## 2023-10-09 NOTE — ED Triage Notes (Signed)
 Patient to Urgent Care with complaints of head/ nasal congestion/ ear fullness/ fatigue/ chills/ sneezing/ max temp 99.1.  Symptoms x2 days. Reports feeling a little better this morning.   Taking nyquil.

## 2023-10-09 NOTE — Discharge Instructions (Addendum)
 Take Tylenol as needed for fever or discomfort.  Take plain Mucinex as needed for congestion.  Rest and keep yourself hydrated.    Follow-up with your primary care provider if your symptoms are not improving.

## 2023-10-09 NOTE — ED Provider Notes (Signed)
 Clifford Alexander    CSN: [id number] Arrival date & time: 10/09/23  9082      History   Chief Complaint Chief Complaint  Patient presents with   Nasal Congestion    HPI KEE DRUDGE is a 58 y.o. male.  Patient presents with 2-day history of fever, body aches, headache, runny nose, sneezing, congestion, ear pain, cough.  No shortness of breath.  Tmax 99.1. He has been treating his symptoms with Nyquil.  No OTC medications taken today.  He used a nasal rinse this morning.  The history is provided by the patient and medical records.    Past Medical History:  Diagnosis Date   Bradycardia    CPAP (continuous positive airway pressure) dependence    Fatigue 06/23/2008   Hyperlipidemia    Hypertension    Insomnia 04/19/2007   Neuromuscular disorder (HCC)    neuropathy in feet   Shingles 01/2016   Sleep apnea    Thrombocytopenia (HCC)     Patient Active Problem List   Diagnosis Date Noted   Elevated PSA 04/06/2023   Right hip pain 02/27/2023   Viral illness 06/15/2022   BPH (benign prostatic hyperplasia) 02/10/2022   Vision changes 02/02/2022   Knee pain, left 09/30/2021   Light headedness 02/26/2021   Neck pain 02/25/2021   Hernia, inguinal 02/26/2020   Thrombocytopenia (HCC) 10/09/2018   Renal cyst 10/09/2018   OSA (obstructive sleep apnea) 09/26/2017   Headache 09/26/2017   Polyneuropathy 07/18/2017   Abdominal pain 07/13/2017   CTS (carpal tunnel syndrome) 02/05/2017   Numbness in feet 10/27/2016   Vitamin D  deficiency 10/27/2016   Health care maintenance 11/19/2015   Acute bilateral low back pain without sciatica 07/18/2015   History of hay fever 10/23/2014   GERD (gastroesophageal reflux disease) 10/23/2014   Cephalalgia 10/23/2014   Dysmetabolic syndrome 10/23/2014   Hyperglycemia 10/23/2014   Hyperlipidemia 10/23/2014   External thrombosed hemorrhoids 04/06/2008   Combined fat and carbohydrate induced hyperlipemia 02/14/2008   Cannot sleep  04/19/2007   Benign essential HTN 08/05/2006    Past Surgical History:  Procedure Laterality Date   LUMBAR LAMINECTOMY  04/22/2007   L4-L5   discectomy 03/2008       Home Medications    Prior to Admission medications   Medication Sig Start Date End Date Taking? Authorizing Provider  Alpha-Lipoic Acid 200 MG CAPS Take 600 mg by mouth daily.    [provider]  amLODipine  (NORVASC ) 5 MG tablet Take 1 tablet (5 mg total) by mouth daily. 06/29/23   Glendia Shad, MD  Cholecalciferol (VITAMIN D3) 125 MCG (5000 UT) CAPS Take 1 capsule by mouth daily.    [provider]  MAGNESIUM OXIDE PO Take 400 mg by mouth daily.    [provider]  omega-3 acid ethyl esters (LOVAZA) 1 G capsule Take by mouth 2 (two) times daily.    [provider]  pantoprazole  (PROTONIX ) 40 MG tablet Take 1 tablet by mouth once daily 06/22/21   Webb, Padonda B, FNP  Probiotic Product (PROBIOTIC PO) Take by mouth daily.    [provider]  rosuvastatin  (CRESTOR ) 10 MG tablet Take 1 tablet 2x/week. 06/29/23   Glendia Shad, MD  silodosin  (RAPAFLO ) 8 MG CAPS capsule Take 1 capsule (8 mg total) by mouth daily with breakfast. 08/04/23   Vaillancourt, Lucie, PA-C  tamsulosin  (FLOMAX ) 0.4 MG CAPS capsule Take 1 capsule (0.4 mg total) by mouth daily. 04/13/23   Vaillancourt, Samantha, PA-C  Turmeric 500  MG CAPS Take 500 mg by mouth daily.     [provider]  vitamin B-12 (CYANOCOBALAMIN) 100 MCG tablet Take 100 mcg by mouth daily.    [provider]    Family History Family History  Problem Relation Age of Onset   Hyperthyroidism Mother    Kidney disease Mother    Hypertension Father    Prostate cancer Paternal Grandfather     Social History Social History   Tobacco Use   Smoking status: Former    Current packs/day: 0.00    Average packs/day: 1 pack/day for 10.0 years (10.0 ttl pk-yrs)    Types: Cigarettes    Start date: 04/18/1982    Quit date:  04/18/1992    Years since quitting: 31.4   Smokeless tobacco: Never   Tobacco comments:    quit in 1994  Vaping Use   Vaping status: Never Used  Substance Use Topics   Alcohol use: No    Alcohol/week: 0.0 standard drinks of alcohol   Drug use: No     Allergies   Naproxen, Ciprofloxacin, and Ibuprofen   Review of Systems Review of Systems  Constitutional:  Positive for chills and fever.  HENT:  Positive for congestion, ear pain, rhinorrhea and sneezing. Negative for sore throat.   Respiratory:  Positive for cough. Negative for shortness of breath.   Neurological:  Positive for headaches.     Physical Exam Triage Vital Signs ED Triage Vitals  Encounter Vitals Group     BP      Girls Systolic BP Percentile      Girls Diastolic BP Percentile      Boys Systolic BP Percentile      Boys Diastolic BP Percentile      Pulse      Resp      Temp      Temp src      SpO2      Weight      Height      Head Circumference      Peak Flow      Pain Score      Pain Loc      Pain Education      Exclude from Growth Chart    No data found.  Updated Vital Signs BP 133/84   Pulse 82   Temp 98.5 F (36.9 C)   Resp 18   SpO2 96%   Visual Acuity Right Eye Distance:   Left Eye Distance:   Bilateral Distance:    Right Eye Near:   Left Eye Near:    Bilateral Near:     Physical Exam Constitutional:      General: He is not in acute distress. HENT:     Right Ear: Tympanic membrane normal.     Left Ear: Tympanic membrane normal.     Nose: Rhinorrhea present.     Mouth/Throat:     Mouth: Mucous membranes are moist.     Pharynx: Oropharynx is clear.     Comments: PND Cardiovascular:     Rate and Rhythm: Normal rate and regular rhythm.     Heart sounds: Normal heart sounds.  Pulmonary:     Effort: Pulmonary effort is normal. No respiratory distress.     Breath sounds: Normal breath sounds.  Neurological:     Mental Status: He is alert.      UC Treatments / Results   Labs (all labs ordered are listed, but only abnormal results are displayed) Labs Reviewed -  No data to display  EKG   Radiology No results found.  Procedures Procedures (including critical care time)  Medications Ordered in UC Medications - No data to display  Initial Impression / Assessment and Plan / UC Course  I have reviewed the triage vital signs and the nursing notes.  Pertinent labs & imaging results that were available during my care of the patient were reviewed by me and considered in my medical decision making (see chart for details).   Viral URI.   Afebrile and vital signs are stable.  Patient declines COVID test.  Discussed symptomatic treatment including Tylenol  or ibuprofen as needed for fever or discomfort, plain Mucinex as needed for congestion, rest, hydration.  Instructed patient to follow-up with his PCP if not improving.  ED precautions given.  Patient agrees to plan of care.   Final Clinical Impressions(s) / UC Diagnoses   Final diagnoses:  Viral URI     Discharge Instructions      Take Tylenol  as needed for fever or discomfort.  Take plain Mucinex as needed for congestion.  Rest and keep yourself hydrated.    Follow-up with your primary care provider if your symptoms are not improving.         ED Prescriptions   None    PDMP not reviewed this encounter.   Corlis Burnard DEL, NP 10/09/23 [phone]

## 2023-10-12 ENCOUNTER — Ambulatory Visit: Attending: Neurology | Admitting: Physical Therapy

## 2023-10-12 DIAGNOSIS — R42 Dizziness and giddiness: Secondary | ICD-10-CM | POA: Diagnosis present

## 2023-10-12 DIAGNOSIS — R2681 Unsteadiness on feet: Secondary | ICD-10-CM | POA: Insufficient documentation

## 2023-10-12 DIAGNOSIS — R262 Difficulty in walking, not elsewhere classified: Secondary | ICD-10-CM | POA: Diagnosis present

## 2023-10-12 DIAGNOSIS — M542 Cervicalgia: Secondary | ICD-10-CM | POA: Diagnosis present

## 2023-10-12 NOTE — Therapy (Signed)
 OUTPATIENT PHYSICAL THERAPY VESTIBULAR TREATMENT     Patient Name: Clifford Alexander MRN: [id number] DOB:1966-03-30, 58 y.o., male Today's Date: 10/12/2023  END OF SESSION:   PT End of Session - 10/12/23 1450     Visit Number 4    Number of Visits 25    Date for PT Re-Evaluation 11/02/23    PT Start Time 1450    PT Stop Time 1528    PT Time Calculation (min) 38 min    Equipment Utilized During Treatment Gait belt    Activity Tolerance Patient tolerated treatment well    Behavior During Therapy WFL for tasks assessed/performed             Past Medical History:  Diagnosis Date   Bradycardia    CPAP (continuous positive airway pressure) dependence    Fatigue 06/23/2008   Hyperlipidemia    Hypertension    Insomnia 04/19/2007   Neuromuscular disorder (HCC)    neuropathy in feet   Shingles 01/2016   Sleep apnea    Thrombocytopenia (HCC)    Past Surgical History:  Procedure Laterality Date   LUMBAR LAMINECTOMY  04/22/2007   L4-L5   discectomy 03/2008   Patient Active Problem List   Diagnosis Date Noted   Elevated PSA 04/06/2023   Right hip pain 02/27/2023   Viral illness 06/15/2022   BPH (benign prostatic hyperplasia) 02/10/2022   Vision changes 02/02/2022   Knee pain, left 09/30/2021   Light headedness 02/26/2021   Neck pain 02/25/2021   Hernia, inguinal 02/26/2020   Thrombocytopenia (HCC) 10/09/2018   Renal cyst 10/09/2018   OSA (obstructive sleep apnea) 09/26/2017   Headache 09/26/2017   Polyneuropathy 07/18/2017   Abdominal pain 07/13/2017   CTS (carpal tunnel syndrome) 02/05/2017   Numbness in feet 10/27/2016   Vitamin D  deficiency 10/27/2016   Health care maintenance 11/19/2015   Acute bilateral low back pain without sciatica 07/18/2015   History of hay fever 10/23/2014   GERD (gastroesophageal reflux disease) 10/23/2014   Cephalalgia 10/23/2014   Dysmetabolic syndrome 10/23/2014   Hyperglycemia 10/23/2014   Hyperlipidemia 10/23/2014   External  thrombosed hemorrhoids 04/06/2008   Combined fat and carbohydrate induced hyperlipemia 02/14/2008   Cannot sleep 04/19/2007   Benign essential HTN 08/05/2006    PCP:  Glendia Shad, MD  REFERRING PROVIDER: Evern Allyson Hacker, FNP   REFERRING DIAG:  Diagnosis  M54.2 (ICD-10-CM) - Cervicalgia  R42 (ICD-10-CM) - Dizziness    THERAPY DIAG:  Unsteadiness on feet  Dizziness and giddiness  Cervicalgia  Difficulty in walking, not elsewhere classified  ONSET DATE: onset 2022  Rationale for Evaluation and Treatment: Rehabilitation  SUBJECTIVE:   SUBJECTIVE STATEMENT: 10/12/23: Pt reports he is feeling much better after his recent upper respiratory illness (URI). Pt states his dizziness seems like it might be a little better. Pt reports he has been trying to do his exercises and they are going well. Pt reports the head turning one still causes him to become a little dizzy (horizontal head rotations). Denies falls/stumbles. Denies pain, but just states having some neck tightness.  Reports he checked his BP immediately prior to coming to therapy and it was 120/79. Pt reports the only time he has had dizziness is when he gets up to go to the bathroom in the middle of the night reporting 2-3 seconds of a swimmy headed type dizziness.   Pt states head turning while he is driving his truck doesn't bother him too bad.    From Eval:  The pt is a pleasant 58 y/o male referred to PT for dizziness and cervicalgia. Per pt and referral he was seen by ENT 1-2 years ago wit negative work-up: VNG and [employer] [place] testing.  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 He reports turning his head while driving is bothersome/can cause his symptoms.  Pt reports onset of dizzy symptoms in 2022. One morning he got up and felt dizzy, says he felt unstable but no spinning. He reports he has never had spinning. He also describes dizziness as lightheaded. He notices when  he gets up quickly this triggers his symptoms. This also happens when he stands up quickly, especially after sitting for along time. Dizziness lasts for a few seconds. He reports his symptoms are mild. Symptoms have remained the same since this started. Pt thinks prostate medication might be affecting him as e reports a side effect is dizziness. He notices he is unbalanced with his eyes closed. He has not fallen yet/no falls in the last six months. Pt reports he cannot get cervical MRI per his insurance until he tries PT. Pt reports he has not taken BP medication in about three months; says he is supposed to take this 1x/daily. PT instructs pt to follow his physician's instructions/prescription for taking his BP meds.   Pt reports no ringing in his ears, no ear fullness, no light or sound sensitivity, no car sickness, no issues with screens or busy stores. He does report moving his head side-to -side makes him lightheaded. Tilting head up or down does not bring on symptoms.     Pt accompanied by: self  PERTINENT HISTORY: Per chart bradycardia, CPAP, HTN, HLD, neuromuscular disorder, sleep apnea, generalized sensorimotor polyneuropathy.  PAIN:  Are you having pain? Reports hx of neck spurs, reports one day he can be fine next day he can have posterior neck pain. Turning his head makes his neck sore  PRECAUTIONS: None  RED FLAGS: None   WEIGHT BEARING RESTRICTIONS: No  FALLS: Has patient fallen in last 6 months? No  LIVING ENVIRONMENT: deferred  PLOF: Independent  PATIENT GOALS: Do away with the dizziness  OBJECTIVE:  Note: Objective measures were completed at Evaluation unless otherwise noted.  DIAGNOSTIC FINDINGS: via chart  02/25/21 DG Cervical spine  FINDINGS: Cervical spine is visualized from the skull base through the cervicothoracic junction. Prevertebral soft tissues are within normal limits. Uncovertebral disease is most evident at C4-5 and C5-6 with loss of disc height  at both of these levels. Uncovertebral disease is worse on the right. No acute or healing fractures are present. The lung apices are clear.   IMPRESSION: 1. No acute abnormality. 2. Uncovertebral disease and loss of disc height is greatest at C4-5 and C5-6.     Electronically Signed   By: Lonni Necessary M.D.   On: 02/25/2021 09:19  COGNITION: Overall cognitive status: Within functional limits for tasks assessed   SENSATION: Reports carpal tunnel bilat hands, reports not normally have n/t in feet  10/12/2023: pt reports having neuropathy with numbness in his feet and difficulty maintaining balance with his eyes closed  EDEMA:  Reports none    POSTURE:  rounded shoulders  Cervical ROM:    Active A/PROM (deg) eval  Flexion 60 deg*  Extension 55 deg  Right lateral flexion 45  Left lateral flexion 36  Right rotation ~80  Left rotation ~80  (Blank rows = not tested) *=pain limited, felt in posterior neck  STRENGTH: not assessed   BED MOBILITY:  Pt does report some dizziness rolling to R, gets a bit lightheaded (mild symptoms)  TRANSFERS: Assistive device utilized: None  Sit to stand: Complete Independence Stand to sit: Complete Independence Chair to chair: Complete Independence    GAIT: Gait pattern: not formally assessed on this date, however, pt ambulates without AD, scanning likely impaired as pt reports he becomes symptomatic with quick head turns   FUNCTIONAL TESTS:  Dynamic Gait Index: deferred  PATIENT SURVEYS:  DHI 18 = low perception of handicap  VESTIBULAR ASSESSMENT:  OCULOMOTOR EXAM:  Ocular Alignment: normal  Ocular ROM: No Limitations - but slight dizziness at extremes with testing   Spontaneous Nystagmus: absent  Gaze-Induced Nystagmus: absent  End-Gaze nystagmus: possibly present and likey WNL for age, does make pt dizzy with R end gaze   Smooth Pursuits: saccades likely WNL for age   Saccades: appears to have corrective  saccades bilat - reports faint dizziness with testing, may benefit from repeat testing future visit   Convergence/Divergence: WNL movement - reports slight dizziness  Comments: Pt does report this is same kind of dizziness he feels with sit>stand, described as a lightheaded sensation    VESTIBULAR - OCULAR REFLEX: deferred  Slow VOR:   VOR Cancellation:   Head-Impulse Test:   Dynamic Visual Acuity:    POSITIONAL TESTING: deferred    OTHOSTATICS:   Supine: 153/88 mmHg HR 56 bpm  Seated: 164/93 mmHg HR 55 Reports some dizziness  Seated: 165/102 mmHg HR 62 bpm  Repeated standing after 2 min: 158/102 mmHg  Pt reports he has not been taking his BP med, has not taken it in 3 months.  PT advised pt to follow physiican instruction to take his BP. Provides education on signs/symptoms to watch for and when to seek emergency care (worsening headache, vertigo/vision change, increased unsteadiness, new n/t). PT also advises pt to continue to monitor BP at home. Pt reports BP when taken at home is typically lower than BP #s here today. Comments: somewhat lightehaded throughout, slight headache (instructed to monitor at home, if worsens seek emergency care)                                                                                                                             TREATMENT DATE:   Educated pt on ensuring adequate water intake and on performing more slow/gradual transitions at night from supine>sit>stand. Requested pt monitor his symptoms following these recommendations and report back any changes.   Dynamic gait training with visual dual-task interventions as follows:  ~379ft forward gait R/L head turns on verbal command ~every 3-5 steps Progressed to R/L head turns to visually scan environment and name post-it notes on wall CGA for safety with slight instability, but improves with repetition ~348ft backwards gait Same progression as above ~155ft forward gait with therapist  providing closer visual target of cards with goal of increased degree of head turns and more  unpredictable movements in each direction No reports of symptoms  Pt reports he has neuropathy and numbness in bottom of his feet. Educated on 3 balance systems: vision, vestibular, and somatosensory. Educated pt on recommendation to install red night lights to help him see to get to/from bathroom.   Dynamic gait training with visual dual-task as follows:  ~169ft Forward gait with horizontal VOR cancellation  3/10 dizziness, but recovered to 0 during standing rest break ~135ft Forward gait with vertical VOR cancellation 0/10 symptoms Repeated ~160ft of horizontal VOR with pt again reporting symptoms ~2-3/10 CGA for safety throughout, pt able to perform appropriate adjustments of his steps to maintain his balance without increased assist  Pt reports he does have difficulty sleeping more than 3-4 hours consecutively.   Pt reports some nights when he goes to sleep and tries to get into a deep sleep, something clicks and he isn't able to get into that deep sleep state. Pt reports having a consistent sleep schedule for past 3-4 years (his job is no longer switching day shift and night shift). Pt reports he takes magnesium and melatonin ~36min - 1 hour before going to sleep (reports his MD is aware). Pt states he has been sleeping better while taking the Nyquil for his recent URI, but knows this is not a long term solution. Pt also reports recently having to start using CPAP machine at night.  Therapist educated pt on benefits of developing a night time routine to help his brain get into a pattern of preparing for sleep as well as recommended pt stop using his phone ~62min-1hour prior to lying down at night. Discussed option of meditating prior to going to sleep.   Pt reports having some visual symptoms when driving through tunnels, but feels this may be related to his vision. Pt denies feeling like the  quick lights moving past him impact his symptoms and feels it is more related to his visual acuity. Because when pt removes his glasses he feels a little weasy feeling in the head. Pt has plans to follow-up with eye doctor this year.    PATIENT EDUCATION: Education details: see above, exam findings, additionally, plan, goals Person educated: Patient Education method: Explanation, demo, printout Education comprehension: verbalized understanding  HOME EXERCISE PROGRAM:  Access Code: [email or web address] URL: [email or web address] Date: 08/10/2023 Prepared by: Darryle Patten  Exercises - Seated Upper Trapezius Stretch  - 1 x daily - 7 x weekly - 2 sets - 1 reps - 30 seconds hold   Provided addition to HEP and reviewed instruction in session: Access Code: [email or web address] URL: [email or web address] Date: 09/28/2023 Prepared by: Darryle Patten  Exercises - Walking with Head Rotation  - 1 x daily - 7 x weekly - 4 sets - 1 reps - 30 seconds hold - Seated Cervical Extension AROM  - 1 x daily - 7 x weekly - 2 sets - 1 reps - 30 seconds hold   GOALS: Goals reviewed with patient? Yes initiated  SHORT TERM GOALS: Target date: 11/23/2023   Patient will be independent in home exercise program to improve balance, dizziness and mobility for better functional independence with ADLs. Baseline:initiated  Goal status: INITIAL   LONG TERM GOALS: Target date: 01/04/2024      1.  Patient will reduce dizziness handicap inventory score by 10 points for less dizziness with ADLs and increased safety with home and work tasks.  Baseline: 18 Goal status: INITIAL  2.  Pt will demonstrate ability to complete WNL cervical flexion AROM without  posterior neck pain for improved mobility and QOL  Baseline: 60 deg and pain-limited,felt posterior neck Goal status: INITIAL  3.  Patient will increase dynamic gait index score to >19/24 as to demonstrate reduced fall risk and improved dynamic gait  balance for better safety with community/home ambulation.   Baseline:  Goal status: INITIAL  3.  Patient will demo and report ability to complete quick horizontal head movements without dizziness for improved ease with driving and scanning with gait.  Baseline: quick head turns trigger pt's symptoms  Goal status: INITIAL    ASSESSMENT:  CLINICAL IMPRESSION:  Therapy session continued progression of head turning and gaze stabilization exercise during dynamic gait training with pt continuing to be the most symptomatic during horizontal head turns. Therapy session also focused  on patient education given his dizziness symptoms occur primarily at night when transitioning supine>standing rather quickly to go to the bathroom. Therapist provided education on ensuring adequate water intake as well as slower positional changes. Therapist also provided education on importance of developing a routine before going to bed to help his brain prepare for sleep because pt reports only getting ~3-4hrs of consecutive sleep.The pt will benefit from further skilled PT to improve dizziness and neck pain in order to increase QOL and ease with mobility/work demands.    OBJECTIVE IMPAIRMENTS: decreased balance, decreased mobility, decreased ROM, dizziness, impaired sensation, postural dysfunction, and pain.   ACTIVITY LIMITATIONS: transfers, bed mobility, and locomotion level  PARTICIPATION LIMITATIONS: driving, community activity, and occupation  PERSONAL FACTORS: Profession, Time since onset of injury/illness/exacerbation, and 3+ comorbidities: Per chart bradycardia, CPAP, HTN, HLD, neuromuscular disorder, sleep apnea, generalized sensorimotor polyneuropathy. are also affecting patient's functional outcome.   REHAB POTENTIAL: Good  CLINICAL DECISION MAKING: Evolving/moderate complexity  EVALUATION COMPLEXITY: Moderate   PLAN:  PT FREQUENCY: 1-2x/week  PT DURATION: 12 weeks  PLANNED INTERVENTIONS:  [id number]- PT Re-evaluation, 97750- Physical Performance Testing, 97110-Therapeutic exercises, 97530- Therapeutic activity, [id number]- Neuromuscular re-education, 97535- Self Care, [id number]- Manual therapy, [id number]- Gait training, [phone]- Orthotic Initial, [phone]- Orthotic/Prosthetic subsequent, [phone]- Canalith repositioning, Patient/Family education, Balance training, Stair training, Taping, Dry Needling, Joint mobilization, Spinal mobilization, Vestibular training, DME instructions, Cryotherapy, and Moist heat  PLAN FOR NEXT SESSION:  - follow-up on education during last session and impact on his symptoms gaze stabilization, habituation,  balance, manual, reassess BP      Karyna Bessler, PT, DPT, NCS, CSRS Physical Therapist - [place]  [facility]  3:31 PM 10/12/23

## 2023-10-19 ENCOUNTER — Ambulatory Visit

## 2023-10-26 ENCOUNTER — Ambulatory Visit: Admitting: Physical Therapy

## 2023-10-26 DIAGNOSIS — R2681 Unsteadiness on feet: Secondary | ICD-10-CM | POA: Diagnosis not present

## 2023-10-26 DIAGNOSIS — M542 Cervicalgia: Secondary | ICD-10-CM

## 2023-10-26 DIAGNOSIS — R262 Difficulty in walking, not elsewhere classified: Secondary | ICD-10-CM

## 2023-10-26 DIAGNOSIS — R42 Dizziness and giddiness: Secondary | ICD-10-CM

## 2023-10-26 NOTE — Therapy (Signed)
 OUTPATIENT PHYSICAL THERAPY VESTIBULAR TREATMENT     Patient Name: Clifford Alexander MRN: [id number] DOB:[birth year], 58 y.o., male Today's Date: 10/26/2023  END OF SESSION:   PT End of Session - 10/26/23 1150     Visit Number 5    Number of Visits 25    Date for PT Re-Evaluation 11/02/23    PT Start Time 1150    PT Stop Time 1233    PT Time Calculation (min) 43 min    Equipment Utilized During Treatment Gait belt    Activity Tolerance Patient tolerated treatment well    Behavior During Therapy WFL for tasks assessed/performed              Past Medical History:  Diagnosis Date   Bradycardia    CPAP (continuous positive airway pressure) dependence    Fatigue 06/23/2008   Hyperlipidemia    Hypertension    Insomnia 04/19/2007   Neuromuscular disorder (HCC)    neuropathy in feet   Shingles 01/2016   Sleep apnea    Thrombocytopenia (HCC)    Past Surgical History:  Procedure Laterality Date   LUMBAR LAMINECTOMY  04/22/2007   L4-L5   discectomy 03/2008   Patient Active Problem List   Diagnosis Date Noted   Elevated PSA 04/06/2023   Right hip pain 02/27/2023   Viral illness 06/15/2022   BPH (benign prostatic hyperplasia) 02/10/2022   Vision changes 02/02/2022   Knee pain, left 09/30/2021   Light headedness 02/26/2021   Neck pain 02/25/2021   Hernia, inguinal 02/26/2020   Thrombocytopenia (HCC) 10/09/2018   Renal cyst 10/09/2018   OSA (obstructive sleep apnea) 09/26/2017   Headache 09/26/2017   Polyneuropathy 07/18/2017   Abdominal pain 07/13/2017   CTS (carpal tunnel syndrome) 02/05/2017   Numbness in feet 10/27/2016   Vitamin D  deficiency 10/27/2016   Health care maintenance 11/19/2015   Acute bilateral low back pain without sciatica 07/18/2015   History of hay fever 10/23/2014   GERD (gastroesophageal reflux disease) 10/23/2014   Cephalalgia 10/23/2014   Dysmetabolic syndrome 10/23/2014   Hyperglycemia 10/23/2014   Hyperlipidemia 10/23/2014   External  thrombosed hemorrhoids 04/06/2008   Combined fat and carbohydrate induced hyperlipemia 02/14/2008   Cannot sleep 04/19/2007   Benign essential HTN 08/05/2006    PCP:  Glendia Shad, MD  REFERRING PROVIDER: Evern Allyson Hacker, FNP   REFERRING DIAG:  Diagnosis  M54.2 (ICD-10-CM) - Cervicalgia  R42 (ICD-10-CM) - Dizziness    THERAPY DIAG:  Unsteadiness on feet  Dizziness and giddiness  Cervicalgia  Difficulty in walking, not elsewhere classified  ONSET DATE: onset 2022  Rationale for Evaluation and Treatment: Rehabilitation  SUBJECTIVE:   SUBJECTIVE STATEMENT: 10/26/23: Pt states he had a long week driving this past week, stating things were fair. Pt states he is not as dizzy when he gets up in the night now that he is sitting on EOB for a few seconds before standing. Pt states his sleep routine has been increasing in consistency going to sleep and waking up similar times. Pt states he continues to have a little bit of dizziness Pt states he still needs to set-up an appointment with his ophthalmologist to assess his visual acuity. Patient reports ever since he started doing his HEP his neck pain is not as bad now and it feels like it has loosened up.   Pt states his dizziness is primarily still at night, but he did notice it yesterday once, but after he ate a lot of ice cream causing  elevated sugar. Pt states he does still have brief dizziness with seated horizontal head turns. Pt states dizziness is not impacting him driving.   From Eval:  The pt is a pleasant 58 y/o male referred to PT for dizziness and cervicalgia. Per pt and referral he was seen by ENT 1-2 years ago wit negative work-up: VNG and [place] testing.  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 He reports turning his head while driving is bothersome/can cause his symptoms.  Pt reports onset of dizzy symptoms in 2022. One morning he got up and felt dizzy, says he felt  unstable but no spinning. He reports he has never had spinning. He also describes dizziness as lightheaded. He notices when he gets up quickly this triggers his symptoms. This also happens when he stands up quickly, especially after sitting for along time. Dizziness lasts for a few seconds. He reports his symptoms are mild. Symptoms have remained the same since this started. Pt thinks prostate medication might be affecting him as e reports a side effect is dizziness. He notices he is unbalanced with his eyes closed. He has not fallen yet/no falls in the last six months. Pt reports he cannot get cervical MRI per his insurance until he tries PT. Pt reports he has not taken BP medication in about three months; says he is supposed to take this 1x/daily. PT instructs pt to follow his physician's instructions/prescription for taking his BP meds.   Pt reports no ringing in his ears, no ear fullness, no light or sound sensitivity, no car sickness, no issues with screens or busy stores. He does report moving his head side-to -side makes him lightheaded. Tilting head up or down does not bring on symptoms.     Pt accompanied by: self  PERTINENT HISTORY: Per chart bradycardia, CPAP, HTN, HLD, neuromuscular disorder, sleep apnea, generalized sensorimotor polyneuropathy.  PAIN:  Are you having pain? Reports hx of neck spurs, reports one day he can be fine next day he can have posterior neck pain. Turning his head makes his neck sore  PRECAUTIONS: None  RED FLAGS: None   WEIGHT BEARING RESTRICTIONS: No  FALLS: Has patient fallen in last 6 months? No  LIVING ENVIRONMENT: deferred  PLOF: Independent  PATIENT GOALS: Do away with the dizziness  OBJECTIVE:  Note: Objective measures were completed at Evaluation unless otherwise noted.  DIAGNOSTIC FINDINGS: via chart  02/25/21 DG Cervical spine  FINDINGS: Cervical spine is visualized from the skull base through the cervicothoracic junction.  Prevertebral soft tissues are within normal limits. Uncovertebral disease is most evident at C4-5 and C5-6 with loss of disc height at both of these levels. Uncovertebral disease is worse on the right. No acute or healing fractures are present. The lung apices are clear.   IMPRESSION: 1. No acute abnormality. 2. Uncovertebral disease and loss of disc height is greatest at C4-5 and C5-6.     Electronically Signed   By: Lonni Necessary M.D.   On: 02/25/2021 09:19  COGNITION: Overall cognitive status: Within functional limits for tasks assessed   SENSATION: Reports carpal tunnel bilat hands, reports not normally have n/t in feet  10/12/2023: pt reports having neuropathy with numbness in his feet and difficulty maintaining balance with his eyes closed  EDEMA:  Reports none    POSTURE:  rounded shoulders  Cervical ROM:    Active A/PROM (deg) eval  Flexion 60 deg*  Extension 55 deg  Right lateral flexion  45  Left lateral flexion 36  Right rotation ~80  Left rotation ~80  (Blank rows = not tested) *=pain limited, felt in posterior neck   STRENGTH: not assessed   BED MOBILITY:  Pt does report some dizziness rolling to R, gets a bit lightheaded (mild symptoms)  TRANSFERS: Assistive device utilized: None  Sit to stand: Complete Independence Stand to sit: Complete Independence Chair to chair: Complete Independence    GAIT: Gait pattern: not formally assessed on this date, however, pt ambulates without AD, scanning likely impaired as pt reports he becomes symptomatic with quick head turns   FUNCTIONAL TESTS:  Dynamic Gait Index: deferred  PATIENT SURVEYS:  DHI 18 = low perception of handicap  VESTIBULAR ASSESSMENT:  OCULOMOTOR EXAM:  Ocular Alignment: normal  Ocular ROM: No Limitations - but slight dizziness at extremes with testing   Spontaneous Nystagmus: absent  Gaze-Induced Nystagmus: absent  End-Gaze nystagmus: possibly present and likey WNL for  age, does make pt dizzy with R end gaze   Smooth Pursuits: saccades likely WNL for age   Saccades: appears to have corrective saccades bilat - reports faint dizziness with testing, may benefit from repeat testing future visit   Convergence/Divergence: WNL movement - reports slight dizziness  Comments: Pt does report this is same kind of dizziness he feels with sit>stand, described as a lightheaded sensation    VESTIBULAR - OCULAR REFLEX: deferred  Slow VOR:   VOR Cancellation:   Head-Impulse Test:   Dynamic Visual Acuity:    POSITIONAL TESTING: deferred    OTHOSTATICS:   Supine: 153/88 mmHg HR 56 bpm  Seated: 164/93 mmHg HR 55 Reports some dizziness  Seated: 165/102 mmHg HR 62 bpm  Repeated standing after 2 min: 158/102 mmHg  Pt reports he has not been taking his BP med, has not taken it in 3 months.  PT advised pt to follow physiican instruction to take his BP. Provides education on signs/symptoms to watch for and when to seek emergency care (worsening headache, vertigo/vision change, increased unsteadiness, new n/t). PT also advises pt to continue to monitor BP at home. Pt reports BP when taken at home is typically lower than BP #s here today. Comments: somewhat lightehaded throughout, slight headache (instructed to monitor at home, if worsens seek emergency care)                                                                                                                             TREATMENT DATE: 10/26/2023   Educated pt on option of using a metronome to determine the speed at which he is turning his head during horizontal head turns. Educated pt to ensure that he is doing it at a speed that causes increased dizziness but <5/10 and then need to recover to 0/10 or 1/10 in no more than 20 minutes during rest break.   Provided pt with sleep hygiene educational hand-out to reinforce education provided during last session.   Orthostatic vital  sign assessment:  Supine after 5  minutes: BP 132/97 (MAP 107), HR 65bpm  Standing, immediately: BP 138/92 (MAP 106), HR 79bpm, reports a little dizziness rated as 1/10 or 2/10 with pt reporting it as a throbbing pressure in his head with pt stating symptoms dissipate within <1 minute Standing, after 3 minutes: BP 151/106 (MAP 120), HR 79bpm    Vestibular habituation exercises:  Performed seated horizontal head turns x10 reps Rated dizziness as 3/10 afterwards, dissipates in <30seconds Describes this as the same type of dizziness as when he goes to stand up, but this is more intense Transitioned to seated VOR X1 exercise Performs 2x10reps Rates dizziness as 4/10, being worse than just horizontal head turns without visual fixation Dissipates in <30seconds Transitioned to VOR X1 in standing X10reps with pt reporting symptoms not as severe as they are in sitting  Transitioned back to seated VOR X1 in sitting  Pt confirms this as more symptomatic than standing X10reps  Cued pt to focus on improved upright posture x10reps with pt reports symptoms slightly better than when sitting slouched  Educated pt on: Follow-up with cardiology regarding elevated BP in standing at end of orthostatic testing Follow-up with ophthalmology regarding visual acuity Ensuring adequate water intake  Upright posture during seated VOR exercises  Added VOR exercises to his HEP and updated printout provided.  PATIENT EDUCATION: Education details: see above, exam findings, additionally, plan, goals Person educated: Patient Education method: Explanation, demo, printout Education comprehension: verbalized understanding  HOME EXERCISE PROGRAM:  Access Code: [email or web address] URL: [email or web address] Date: 10/26/2023 Prepared by: Connell Kiss  Exercises - Seated Upper Trapezius Stretch  - 1 x daily - 7 x weekly - 2 sets - 1 reps - 30 seconds hold - Vestibular Habituation - Seated Horizontal Head Rotation  - 1 x daily - 7 x weekly -  2 sets - 10 reps - Walking with Head Rotation  - 1 x daily - 7 x weekly - 2 sets - 10 reps - Seated Cervical Extension AROM  - 1 x daily - 7 x weekly - 2 sets - 10 reps - Seated Gaze Stabilization with Head Rotation  - 3 x daily - 7 x weekly - 2 sets - 10 reps - Seated Gaze Stabilization with Head Nod  - 3 x daily - 7 x weekly - 2 sets - 10 reps   GOALS: Goals reviewed with patient? Yes initiated  SHORT TERM GOALS: Target date: 09/21/2023   Patient will be independent in home exercise program to improve balance, dizziness and mobility for better functional independence with ADLs. Baseline:initiated  Goal status: INITIAL   LONG TERM GOALS: Target date: 11/02/2023      1.  Patient will reduce dizziness handicap inventory score by 10 points for less dizziness with ADLs and increased safety with home and work tasks.  Baseline: 18 Goal status: INITIAL  2.  Pt will demonstrate ability to complete WNL cervical flexion AROM without posterior neck pain for improved mobility and QOL  Baseline: 60 deg and pain-limited,felt posterior neck Goal status: INITIAL  3.  Patient will increase dynamic gait index score to >19/24 as to demonstrate reduced fall risk and improved dynamic gait balance for better safety with community/home ambulation.   Baseline:  Goal status: INITIAL  3.  Patient will demo and report ability to complete quick horizontal head movements without dizziness for improved ease with driving and scanning with gait.  Baseline: quick head turns trigger pt's symptoms  Goal status: INITIAL  ASSESSMENT:  CLINICAL IMPRESSION:  Therapy session continued further assessment for potential contributors to patient's dizziness symptoms. Repeated orthostatic vital sign assessment; however, no decrease in BP and HR elevated with positional changes and BP continued to elevate with longer standing - recommended patient have follow-up with cardiology as needed. Therapy session focused on  patient education regarding vestibular system contributions to dizziness vs cardiac involvement vs the need to further assess and discuss cervical contributions. Patient continues to report seated horizontal head turns and seated VOR as what primarily provokes his dizziness increasing to 4/10 at worst with symptoms dissipating in <30 seconds, states seated exercise provoke his symptoms more than standing. Updated pt's HEP to include VOR X1 exercise and educated pt on ensuring upright posture during. The pt will benefit from further skilled PT to improve dizziness and neck pain in order to increase QOL and ease with mobility/work demands.    OBJECTIVE IMPAIRMENTS: decreased balance, decreased mobility, decreased ROM, dizziness, impaired sensation, postural dysfunction, and pain.   ACTIVITY LIMITATIONS: transfers, bed mobility, and locomotion level  PARTICIPATION LIMITATIONS: driving, community activity, and occupation  PERSONAL FACTORS: Profession, Time since onset of injury/illness/exacerbation, and 3+ comorbidities: Per chart bradycardia, CPAP, HTN, HLD, neuromuscular disorder, sleep apnea, generalized sensorimotor polyneuropathy. are also affecting patient's functional outcome.   REHAB POTENTIAL: Good  CLINICAL DECISION MAKING: Evolving/moderate complexity  EVALUATION COMPLEXITY: Moderate   PLAN:  PT FREQUENCY: 1-2x/week  PT DURATION: 12 weeks  PLANNED INTERVENTIONS: [id number]- PT Re-evaluation, 97750- Physical Performance Testing, 97110-Therapeutic exercises, 97530- Therapeutic activity, [id number]- Neuromuscular re-education, 97535- Self Care, [id number]- Manual therapy, [id number]- Gait training, [phone]- Orthotic Initial, [phone]- Orthotic/Prosthetic subsequent, [phone]- Canalith repositioning, Patient/Family education, Balance training, Stair training, Taping, Dry Needling, Joint mobilization, Spinal mobilization, Vestibular training, DME instructions, Cryotherapy, and Moist heat  PLAN FOR NEXT SESSION:   *RECERT* - assess for cervicogenic component to dizziness - follow-up on VOR exercise & progress gaze stabilization exercises - supine<>sitting habituation exercise?    Connell Kiss, PT, DPT, NCS, CSRS Physical Therapist - [place]  [facility]  12:36 PM 10/26/23

## 2023-11-02 ENCOUNTER — Ambulatory Visit

## 2023-11-09 ENCOUNTER — Ambulatory Visit: Attending: Neurology | Admitting: Physical Therapy

## 2023-11-09 DIAGNOSIS — R42 Dizziness and giddiness: Secondary | ICD-10-CM | POA: Diagnosis present

## 2023-11-09 DIAGNOSIS — R2681 Unsteadiness on feet: Secondary | ICD-10-CM | POA: Insufficient documentation

## 2023-11-09 DIAGNOSIS — R262 Difficulty in walking, not elsewhere classified: Secondary | ICD-10-CM | POA: Diagnosis present

## 2023-11-09 DIAGNOSIS — M542 Cervicalgia: Secondary | ICD-10-CM | POA: Insufficient documentation

## 2023-11-09 NOTE — Therapy (Signed)
 OUTPATIENT PHYSICAL THERAPY VESTIBULAR TREATMENT/RECERT     Patient Name: Clifford Alexander MRN: [id number] DOB:December 02, 1965, 58 y.o., male Today's Date: 11/09/2023  END OF SESSION:   PT End of Session - 11/09/23 0937     Visit Number 6    Number of Visits 49    Date for PT Re-Evaluation 02/01/24    PT Start Time 0937    PT Stop Time 1018    PT Time Calculation (min) 41 min    Equipment Utilized During Treatment Gait belt    Activity Tolerance Patient tolerated treatment well    Behavior During Therapy WFL for tasks assessed/performed               Past Medical History:  Diagnosis Date   Bradycardia    CPAP (continuous positive airway pressure) dependence    Fatigue 06/23/2008   Hyperlipidemia    Hypertension    Insomnia 04/19/2007   Neuromuscular disorder (HCC)    neuropathy in feet   Shingles 01/2016   Sleep apnea    Thrombocytopenia (HCC)    Past Surgical History:  Procedure Laterality Date   LUMBAR LAMINECTOMY  04/22/2007   L4-L5   discectomy 03/2008   Patient Active Problem List   Diagnosis Date Noted   Elevated PSA 04/06/2023   Right hip pain 02/27/2023   Viral illness 06/15/2022   BPH (benign prostatic hyperplasia) 02/10/2022   Vision changes 02/02/2022   Knee pain, left 09/30/2021   Light headedness 02/26/2021   Neck pain 02/25/2021   Hernia, inguinal 02/26/2020   Thrombocytopenia (HCC) 10/09/2018   Renal cyst 10/09/2018   OSA (obstructive sleep apnea) 09/26/2017   Headache 09/26/2017   Polyneuropathy 07/18/2017   Abdominal pain 07/13/2017   CTS (carpal tunnel syndrome) 02/05/2017   Numbness in feet 10/27/2016   Vitamin D  deficiency 10/27/2016   Health care maintenance 11/19/2015   Acute bilateral low back pain without sciatica 07/18/2015   History of hay fever 10/23/2014   GERD (gastroesophageal reflux disease) 10/23/2014   Cephalalgia 10/23/2014   Dysmetabolic syndrome 10/23/2014   Hyperglycemia 10/23/2014   Hyperlipidemia 10/23/2014    External thrombosed hemorrhoids 04/06/2008   Combined fat and carbohydrate induced hyperlipemia 02/14/2008   Cannot sleep 04/19/2007   Benign essential HTN 08/05/2006    PCP:  Glendia Shad, MD  REFERRING PROVIDER: Evern Allyson Hacker, FNP   REFERRING DIAG:  Diagnosis  M54.2 (ICD-10-CM) - Cervicalgia  R42 (ICD-10-CM) - Dizziness    THERAPY DIAG:  Unsteadiness on feet  Dizziness and giddiness  Cervicalgia  Difficulty in walking, not elsewhere classified  ONSET DATE: onset 2022  Rationale for Evaluation and Treatment: Rehabilitation  SUBJECTIVE:   SUBJECTIVE STATEMENT: 11/09/23:Pt reports he has been doing a little better this week. Pt states he has been more consistent with his exercises now that he is using the Medbridge app. Pt states he did get the red night lights to help improve his ability to see at night. Pt states he still feels like walking and turning his head is affecting him the most. Pt sates he does still feel like his glasses affect him more too. Pt reports he scheduled an apt with Ophthalmologist on August 25th. Pt states he has to get a referral to go see a cardiologist.  Pt confirms he is no longer having difficulty with dizziness when getting up at night.   From Eval:  The pt is a pleasant 58 y/o male referred to PT for dizziness and cervicalgia. Per pt and referral he  was seen by ENT 1-2 years ago wit negative work-up: V[facility] and [place] testing.  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 He reports turning his head while driving is bothersome/can cause his symptoms.  Pt reports onset of dizzy symptoms in 2022. One morning he got up and felt dizzy, says he felt unstable but no spinning. He reports he has never had spinning. He also describes dizziness as lightheaded. He notices when he gets up quickly this triggers his symptoms. This also happens when he stands up quickly, especially after sitting for along time. Dizziness  lasts for a few seconds. He reports his symptoms are mild. Symptoms have remained the same since this started. Pt thinks prostate medication might be affecting him as e reports a side effect is dizziness. He notices he is unbalanced with his eyes closed. He has not fallen yet/no falls in the last six months. Pt reports he cannot get cervical MRI per his insurance until he tries PT. Pt reports he has not taken BP medication in about three months; says he is supposed to take this 1x/daily. PT instructs pt to follow his physician's instructions/prescription for taking his BP meds.   Pt reports no ringing in his ears, no ear fullness, no light or sound sensitivity, no car sickness, no issues with screens or busy stores. He does report moving his head side-to -side makes him lightheaded. Tilting head up or down does not bring on symptoms.     Pt accompanied by: self  PERTINENT HISTORY: Per chart bradycardia, CPAP, HTN, HLD, neuromuscular disorder, sleep apnea, generalized sensorimotor polyneuropathy.  PAIN:  Are you having pain? Reports hx of neck spurs, reports one day he can be fine next day he can have posterior neck pain. Turning his head makes his neck sore  PRECAUTIONS: None  RED FLAGS: None   WEIGHT BEARING RESTRICTIONS: No  FALLS: Has patient fallen in last 6 months? No  LIVING ENVIRONMENT: deferred  PLOF: Independent  PATIENT GOALS: Do away with the dizziness  OBJECTIVE:  Note: Objective measures were completed at Evaluation unless otherwise noted.  DIAGNOSTIC FINDINGS: via chart  02/25/21 DG Cervical spine  FINDINGS: Cervical spine is visualized from the skull base through the cervicothoracic junction. Prevertebral soft tissues are within normal limits. Uncovertebral disease is most evident at C4-5 and C5-6 with loss of disc height at both of these levels. Uncovertebral disease is worse on the right. No acute or healing fractures are present. The lung apices are  clear.   IMPRESSION: 1. No acute abnormality. 2. Uncovertebral disease and loss of disc height is greatest at C4-5 and C5-6.     Electronically Signed   By: Lonni Necessary M.D.   On: 02/25/2021 09:19  COGNITION: Overall cognitive status: Within functional limits for tasks assessed   SENSATION: Reports carpal tunnel bilat hands, reports not normally have n/t in feet  10/12/2023: pt reports having neuropathy with numbness in his feet and difficulty maintaining balance with his eyes closed  EDEMA:  Reports none    POSTURE:  rounded shoulders  Cervical ROM:    Active A/PROM (deg) eval 11/09/2023  Flexion 60 deg* 55 deg  Extension 55 deg 45 deg  Right lateral flexion 45   Left lateral flexion 36   Right rotation ~80   Left rotation ~80   (Blank rows = not tested) *=pain limited, felt in posterior neck   STRENGTH: not assessed   BED MOBILITY:  Pt does report  some dizziness rolling to R, gets a bit lightheaded (mild symptoms)  TRANSFERS: Assistive device utilized: None  Sit to stand: Complete Independence Stand to sit: Complete Independence Chair to chair: Complete Independence    GAIT: Gait pattern: not formally assessed on this date, however, pt ambulates without AD, scanning likely impaired as pt reports he becomes symptomatic with quick head turns   FUNCTIONAL TESTS:  Dynamic Gait Index: deferred  PATIENT SURVEYS:  DHI 18 = low perception of handicap  VESTIBULAR ASSESSMENT:  OCULOMOTOR EXAM:  Ocular Alignment: normal  Ocular ROM: No Limitations - but slight dizziness at extremes with testing   Spontaneous Nystagmus: absent  Gaze-Induced Nystagmus: absent  End-Gaze nystagmus: possibly present and likey WNL for age, does make pt dizzy with R end gaze   Smooth Pursuits: saccades likely WNL for age   Saccades: appears to have corrective saccades bilat - reports faint dizziness with testing, may benefit from repeat testing future visit    Convergence/Divergence: WNL movement - reports slight dizziness  Comments: Pt does report this is same kind of dizziness he feels with sit>stand, described as a lightheaded sensation    VESTIBULAR - OCULAR REFLEX: deferred  Slow VOR:   VOR Cancellation:   Head-Impulse Test:   Dynamic Visual Acuity:    POSITIONAL TESTING: deferred    OTHOSTATICS:   Supine: 153/88 mmHg HR 56 bpm  Seated: 164/93 mmHg HR 55 Reports some dizziness  Seated: 165/102 mmHg HR 62 bpm  Repeated standing after 2 min: 158/102 mmHg  Pt reports he has not been taking his BP med, has not taken it in 3 months.  PT advised pt to follow physiican instruction to take his BP. Provides education on signs/symptoms to watch for and when to seek emergency care (worsening headache, vertigo/vision change, increased unsteadiness, new n/t). PT also advises pt to continue to monitor BP at home. Pt reports BP when taken at home is typically lower than BP #s here today. Comments: somewhat lightehaded throughout, slight headache (instructed to monitor at home, if worsens seek emergency care)                                                                                                                             TREATMENT DATE: 11/09/2023   Measured patient's cervical flexion and extension AROM and updated above.   THE DIZZINESS HANDICAP INVENTORY (DHI)  P1. Does looking up increase your problem? 0 = No  E2. Because of your problem, do you feel frustrated? 0 = No  F3. Because of your problem, do you restrict your travel for business or recreation?  0 = No  P4. Does walking down the aisle of a supermarket increase your problems?  4 = Yes, which pt states has to do with turning his head and not the visual stimulus  F5. Because of your problem, do you have difficulty getting into or out of bed?  0 = No  F6. Does your problem significantly  restrict your participation in social activities, such as going out to dinner, going to the  movies, dancing, or going to parties? 0 = No  F7. Because of your problem, do you have difficulty reading?  0 = No  P8. Does performing more ambitious activities such as sports, dancing, household chores (sweeping or putting dishes away) increase your problems?  0 = No  E9. Because of your problem, are you afraid to leave your home without having without having someone accompany you?  0 = No  E10. Because of your problem have you been embarrassed in front of others?  0 = No  P11. Do quick movements of your head increase your problem?  2 = Sometimes  F12. Because of your problem, do you avoid heights?  4 = Yes  P13. Does turning over in bed increase your problem?  0 = No  F14. Because of your problem, is it difficult for you to do strenuous homework or yard work? 0 = No  E15. Because of your problem, are you afraid people may think you are intoxicated? 0 = No  F16. Because of your problem, is it difficult for you to go for a walk by yourself?  0 = No  P17. Does walking down a sidewalk increase your problem?  0 = No  E18.Because of your problem, is it difficult for you to concentrate 0 = No  F19. Because of your problem, is it difficult for you to walk around your house in the dark? 0 = No  E20. Because of your problem, are you afraid to stay home alone?  0 = No  E21. Because of your problem, do you feel handicapped? 0 = No  E22. Has the problem placed stress on your relationships with members of your family or friends? 0 = No  E23. Because of your problem, are you depressed?  0 = No  F24. Does your problem interfere with your job or household responsibilities?  0 = No  P25. Does bending over increase your problem?  2 = Sometimes  TOTAL 10/100    DHI Scoring Instructions  The patient is asked to answer each question as it pertains to dizziness or unsteadiness problems, specifically  considering their condition during the last month. Questions are designed to incorporate functional (F),  physical  (P), and emotional (E) impacts on disability.   Scores greater than 10 points should be referred to balance specialists for further evaluation.   16-34 Points (mild handicap)  36-52 Points (moderate handicap)  54+ Points (severe handicap)  Minimally Detectable Change: 17 points ([address] [place], 1990)  [place], G. SHAUNNA. and [place], C. W. (1990). The development of the Dizziness Handicap Inventory. Archives of Otolaryngology - Head and Neck Surgery 116(4): [id number].     Patient participated in Dynamic Gait Index (DGI) and demonstrates increased fall risk as noted by score of 22/24. (< 19/24 = predictive of falls risk in community dwelling elderly).   [facility] PT Assessment - 11/09/23 0001       Standardized Balance Assessment   Standardized Balance Assessment Dynamic Gait Index      Dynamic Gait Index   Level Surface Normal    Change in Gait Speed Normal    Gait with Horizontal Head Turns Mild Impairment   very slight   Gait with Vertical Head Turns Mild Impairment   minor   Gait and Pivot Turn Normal    Step Over Obstacle Normal    Step Around Obstacles Normal  Steps Normal    Total Score 22          Cervical torsion test R: no symptoms x2 L: no symptoms x2 Therefore, do not anticipated significant cervical contributions to patient's dizziness symptoms.  Gait training on treadmill for at 3.6mph using intermittent UE support for safety totaling 0.10mi while performing alternating R/L horizontal head turns to visually locate targets at variable heights Pt reports a few times he felt off balance  States dizziness symptoms weren't too bad (rated as 1-2/10) HR 94bpm during, recovers to 76bpm  Pt states he is more balanced with his shoes on because it provides a wider BOS due to his neuropathy.   Performed standing NBOS eyes closed x30sec - pt demos slight postural sway - educated pt on adding this to his HEP and performing without shoes on at  home  Reinforced prior education to pt on ensuring that he is doing the gaze stabilization exercises at a speed that causes increased dizziness but <5/10 and then need to recover to 0/10 or 1/10 in no more than 20 minutes during rest break.     PATIENT EDUCATION: Education details: see above, exam findings, additionally, plan, goals Person educated: Patient Education method: Explanation, demo, printout Education comprehension: verbalized understanding  HOME EXERCISE PROGRAM:  Access Code: [email or web address] URL: [email or web address] Date: 11/09/2023 Prepared by: Connell Kiss  Exercises - Seated Upper Trapezius Stretch  - 1 x daily - 7 x weekly - 2 sets - 1 reps - 30 seconds hold - Vestibular Habituation - Seated Horizontal Head Rotation  - 1 x daily - 7 x weekly - 2 sets - 10 reps - Walking with Head Rotation  - 1 x daily - 7 x weekly - 2 sets - 10 reps - Seated Cervical Extension AROM  - 1 x daily - 7 x weekly - 2 sets - 10 reps - Seated Gaze Stabilization with Head Rotation  - 3 x daily - 7 x weekly - 2 sets - 15 reps - Seated Gaze Stabilization with Head Nod  - 3 x daily - 7 x weekly - 2 sets - 15 reps - Narrow Standing with Counter Support  - 1 x daily - 7 x weekly - 2 sets - 30 seconds hold   GOALS: Goals reviewed with patient? Yes initiated  SHORT TERM GOALS: Target date: 12/21/2023   Patient will be independent in home exercise program to improve balance, dizziness and mobility for better functional independence with ADLs. Baseline:initiated  11/09/2023: reports more consistent compliance this week, updated Goal status: ON GOING   LONG TERM GOALS: Target date:  02/01/2024    1.  Patient will reduce dizziness handicap inventory score by 10 points for less dizziness with ADLs and increased safety with home and work tasks.  Baseline: 18 11/09/2023: 10/100 Goal status: IN PROGRESS  2.  Pt will demonstrate ability to complete WNL cervical flexion AROM without  posterior neck pain for improved mobility and QOL  Baseline: 60 deg and pain-limited,felt posterior neck 11/09/2023: 55 deg and pain free Goal status: MET  3.  Patient will increase dynamic gait index score to >19/24 as to demonstrate reduced fall risk and improved dynamic gait balance for better safety with community/home ambulation.  Baseline: 11/09/2023: 22/24 Goal status: MET  3.  Patient will demo and report ability to complete quick horizontal head movements without dizziness for improved ease with driving and scanning with gait.  Baseline: quick head turns trigger pt's symptoms 11/09/2023: still provokes symptoms,  but pt states they are less severe than at initial eval (rate symptoms as 3-/4/10 now)  Goal status: IN PROGRESS    ASSESSMENT:  CLINICAL IMPRESSION:  Therapy session focused on re-assessment of standardized outcome measures and subjective questionnaire to determine pt's progress with therapy thus far and plan for re-certification to extend patient's POC. Patient demons ability to achieve pain free WNL cervical flexion AROM. Performed cervical torsion test and patient not symptomatic; therefore, anticipate no significant contributions to patient's dizziness from his cervical spine. Patient reports improvement on DHI having symptoms primarily with head turning and bending over causing him to avoid heights. Remainder of therapy session focused on continued gait training with head turning to visually locate targets as well as performing standing balance tasks with eyes closed for increased integration of proprioception and vestibular systems given pt with hx of neuropathy. The pt will benefit from further skilled PT to improve dizziness and neck pain in order to increase QOL and ease with mobility/work demands. Patient's condition has the potential to improve in response to therapy. Maximum improvement is yet to be obtained. The anticipated improvement is attainable and reasonable in a  generally predictable time.       OBJECTIVE IMPAIRMENTS: decreased balance, decreased mobility, decreased ROM, dizziness, impaired sensation, postural dysfunction, and pain.   ACTIVITY LIMITATIONS: transfers, bed mobility, and locomotion level  PARTICIPATION LIMITATIONS: driving, community activity, and occupation  PERSONAL FACTORS: Profession, Time since onset of injury/illness/exacerbation, and 3+ comorbidities: Per chart bradycardia, CPAP, HTN, HLD, neuromuscular disorder, sleep apnea, generalized sensorimotor polyneuropathy. are also affecting patient's functional outcome.   REHAB POTENTIAL: Good  CLINICAL DECISION MAKING: Evolving/moderate complexity  EVALUATION COMPLEXITY: Moderate   PLAN:  PT FREQUENCY: every other week due to pt's work schedule  PT DURATION: 12 weeks  PLANNED INTERVENTIONS: 97164- PT Re-evaluation, 97750- Physical Performance Testing, 97110-Therapeutic exercises, 97530- Therapeutic activity, [id number]- Neuromuscular re-education, 97535- Self Care, [id number]- Manual therapy, [id number]- Gait training, [phone]- Orthotic Initial, [phone]- Orthotic/Prosthetic subsequent, [phone]- Canalith repositioning, Patient/Family education, Balance training, Stair training, Taping, Dry Needling, Joint mobilization, Spinal mobilization, Vestibular training, DME instructions, Cryotherapy, and Moist heat  PLAN FOR NEXT SESSION:  - follow-up on VOR exercise & progress gaze stabilization exercises - continue treadmill gait training with horizontal and vertical head turns  - continue balance interventions with eyes closed      Dean Wonder, PT, DPT, NCS, CSRS Physical Therapist - [facility]  [facility]  10:25 AM 11/09/23

## 2023-11-16 ENCOUNTER — Ambulatory Visit

## 2023-11-23 ENCOUNTER — Ambulatory Visit: Admitting: Physical Therapy

## 2023-11-23 ENCOUNTER — Telehealth: Payer: Self-pay

## 2023-11-23 DIAGNOSIS — R262 Difficulty in walking, not elsewhere classified: Secondary | ICD-10-CM

## 2023-11-23 DIAGNOSIS — R42 Dizziness and giddiness: Secondary | ICD-10-CM

## 2023-11-23 DIAGNOSIS — R2681 Unsteadiness on feet: Secondary | ICD-10-CM | POA: Diagnosis not present

## 2023-11-23 DIAGNOSIS — M542 Cervicalgia: Secondary | ICD-10-CM

## 2023-11-23 NOTE — Telephone Encounter (Signed)
 Copied from CRM #[id number]. Topic: Referral - Request for Referral >> Nov 23, 2023  3:39 PM Mercedes MATSU wrote: Did the patient discuss referral with their provider in the last year? No (If No - schedule appointment) (If Yes - send message)  Appointment offered? Yes  Type of order/referral and detailed reason for visit: [facility] said patient should see Cardiologist due to patient getting dizzy he went to the eye doctor and his vision was fine.  Preference of office, provider, location: Whomever with [facility]  recommends  If referral order, have you been seen by this specialty before? Yes, several years ago previous stress test (If Yes, this issue or another issue? When? Where?  Can we respond through MyChart? No, patient requesting call back at [phone]

## 2023-11-23 NOTE — Therapy (Signed)
 OUTPATIENT PHYSICAL THERAPY VESTIBULAR TREATMENT     Patient Name: NAVEN GIAMBALVO MRN: [id number] DOB:01-14-1966, 58 y.o., male Today's Date: 11/23/2023  END OF SESSION:   PT End of Session - 11/23/23 1451     Visit Number 7    Number of Visits 49    Date for PT Re-Evaluation 02/01/24    PT Start Time 1450    PT Stop Time 1534    PT Time Calculation (min) 44 min    Equipment Utilized During Treatment Gait belt    Activity Tolerance Patient tolerated treatment well    Behavior During Therapy WFL for tasks assessed/performed                Past Medical History:  Diagnosis Date   Bradycardia    CPAP (continuous positive airway pressure) dependence    Fatigue 06/23/2008   Hyperlipidemia    Hypertension    Insomnia 04/19/2007   Neuromuscular disorder (HCC)    neuropathy in feet   Shingles 01/2016   Sleep apnea    Thrombocytopenia (HCC)    Past Surgical History:  Procedure Laterality Date   LUMBAR LAMINECTOMY  04/22/2007   L4-L5   discectomy 03/2008   Patient Active Problem List   Diagnosis Date Noted   Elevated PSA 04/06/2023   Right hip pain 02/27/2023   Viral illness 06/15/2022   BPH (benign prostatic hyperplasia) 02/10/2022   Vision changes 02/02/2022   Knee pain, left 09/30/2021   Light headedness 02/26/2021   Neck pain 02/25/2021   Hernia, inguinal 02/26/2020   Thrombocytopenia (HCC) 10/09/2018   Renal cyst 10/09/2018   OSA (obstructive sleep apnea) 09/26/2017   Headache 09/26/2017   Polyneuropathy 07/18/2017   Abdominal pain 07/13/2017   CTS (carpal tunnel syndrome) 02/05/2017   Numbness in feet 10/27/2016   Vitamin D  deficiency 10/27/2016   Health care maintenance 11/19/2015   Acute bilateral low back pain without sciatica 07/18/2015   History of hay fever 10/23/2014   GERD (gastroesophageal reflux disease) 10/23/2014   Cephalalgia 10/23/2014   Dysmetabolic syndrome 10/23/2014   Hyperglycemia 10/23/2014   Hyperlipidemia 10/23/2014    External thrombosed hemorrhoids 04/06/2008   Combined fat and carbohydrate induced hyperlipemia 02/14/2008   Cannot sleep 04/19/2007   Benign essential HTN 08/05/2006    PCP:  Glendia Shad, MD  REFERRING PROVIDER: Evern Allyson Hacker, FNP   REFERRING DIAG:  Diagnosis  M54.2 (ICD-10-CM) - Cervicalgia  R42 (ICD-10-CM) - Dizziness    THERAPY DIAG:  Unsteadiness on feet  Dizziness and giddiness  Cervicalgia  Difficulty in walking, not elsewhere classified  ONSET DATE: onset 2022  Rationale for Evaluation and Treatment: Rehabilitation  SUBJECTIVE:   SUBJECTIVE STATEMENT: 11/23/23: Pt states he had a rough week, last week because he was in the truck for 6 days. Pt states he tries to stop and get out every ~1.5-2hours for a break. Pt states at night on those days he noticed his dizziness a little bit more. Pt states he was able to sleep ~7hours a night and this is a significant improvement for him. Pt states the past few days have been really good and he feels it is correlated to walking 17min/daily the past few day.   Pt states he had follow-up with Ophthalmologist this morning and they tweaked his prescription a little bit. Pt states he ordered new glasses and should have them in ~3 weeks. Patient states he is planning to call to schedule his consult with cardiology today.  Pt states he was  a little dizzy after last session (rated as 2-3/10), but his symptoms resolved after ~1 hour.  Reports having follow-up scheduled with Neurology in November.     From Eval:  The pt is a pleasant 58 y/o male referred to PT for dizziness and cervicalgia. Per pt and referral he was seen by ENT 1-2 years ago wit negative work-up: VNG and [place] testing.  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 He reports turning his head while driving is bothersome/can cause his symptoms.  Pt reports onset of dizzy symptoms in 2022. One morning he got up and  felt dizzy, says he felt unstable but no spinning. He reports he has never had spinning. He also describes dizziness as lightheaded. He notices when he gets up quickly this triggers his symptoms. This also happens when he stands up quickly, especially after sitting for along time. Dizziness lasts for a few seconds. He reports his symptoms are mild. Symptoms have remained the same since this started. Pt thinks prostate medication might be affecting him as e reports a side effect is dizziness. He notices he is unbalanced with his eyes closed. He has not fallen yet/no falls in the last six months. Pt reports he cannot get cervical MRI per his insurance until he tries PT. Pt reports he has not taken BP medication in about three months; says he is supposed to take this 1x/daily. PT instructs pt to follow his physician's instructions/prescription for taking his BP meds.   Pt reports no ringing in his ears, no ear fullness, no light or sound sensitivity, no car sickness, no issues with screens or busy stores. He does report moving his head side-to -side makes him lightheaded. Tilting head up or down does not bring on symptoms.     Pt accompanied by: self  PERTINENT HISTORY: Per chart bradycardia, CPAP, HTN, HLD, neuromuscular disorder, sleep apnea, generalized sensorimotor polyneuropathy.  PAIN:  Are you having pain? Reports hx of neck spurs, reports one day he can be fine next day he can have posterior neck pain. Turning his head makes his neck sore  PRECAUTIONS: None  RED FLAGS: None   WEIGHT BEARING RESTRICTIONS: No  FALLS: Has patient fallen in last 6 months? No  LIVING ENVIRONMENT: deferred  PLOF: Independent  PATIENT GOALS: Do away with the dizziness  OBJECTIVE:  Note: Objective measures were completed at Evaluation unless otherwise noted.  DIAGNOSTIC FINDINGS: via chart  02/25/21 DG Cervical spine  FINDINGS: Cervical spine is visualized from the skull base through  the cervicothoracic junction. Prevertebral soft tissues are within normal limits. Uncovertebral disease is most evident at C4-5 and C5-6 with loss of disc height at both of these levels. Uncovertebral disease is worse on the right. No acute or healing fractures are present. The lung apices are clear.   IMPRESSION: 1. No acute abnormality. 2. Uncovertebral disease and loss of disc height is greatest at C4-5 and C5-6.     Electronically Signed   By: Lonni Necessary M.D.   On: 02/25/2021 09:19  COGNITION: Overall cognitive status: Within functional limits for tasks assessed   SENSATION: Reports carpal tunnel bilat hands, reports not normally have n/t in feet  10/12/2023: pt reports having neuropathy with numbness in his feet and difficulty maintaining balance with his eyes closed  EDEMA:  Reports none    POSTURE:  rounded shoulders  Cervical ROM:    Active A/PROM (deg) eval 11/09/2023  Flexion 60 deg* 55 deg  Extension 55 deg 45 deg  Right lateral flexion 45   Left lateral flexion 36   Right rotation ~80   Left rotation ~80   (Blank rows = not tested) *=pain limited, felt in posterior neck   STRENGTH: not assessed   BED MOBILITY:  Pt does report some dizziness rolling to R, gets a bit lightheaded (mild symptoms)  TRANSFERS: Assistive device utilized: None  Sit to stand: Complete Independence Stand to sit: Complete Independence Chair to chair: Complete Independence    GAIT: Gait pattern: not formally assessed on this date, however, pt ambulates without AD, scanning likely impaired as pt reports he becomes symptomatic with quick head turns   FUNCTIONAL TESTS:  Dynamic Gait Index: deferred  PATIENT SURVEYS:  DHI 18 = low perception of handicap  VESTIBULAR ASSESSMENT:  OCULOMOTOR EXAM:  Ocular Alignment: normal  Ocular ROM: No Limitations - but slight dizziness at extremes with testing   Spontaneous Nystagmus: absent  Gaze-Induced Nystagmus:  absent  End-Gaze nystagmus: possibly present and likey WNL for age, does make pt dizzy with R end gaze   Smooth Pursuits: saccades likely WNL for age   Saccades: appears to have corrective saccades bilat - reports faint dizziness with testing, may benefit from repeat testing future visit   Convergence/Divergence: WNL movement - reports slight dizziness  Comments: Pt does report this is same kind of dizziness he feels with sit>stand, described as a lightheaded sensation    VESTIBULAR - OCULAR REFLEX: deferred  Slow VOR:   VOR Cancellation:   Head-Impulse Test:   Dynamic Visual Acuity:    POSITIONAL TESTING: deferred    OTHOSTATICS:   Supine: 153/88 mmHg HR 56 bpm  Seated: 164/93 mmHg HR 55 Reports some dizziness  Seated: 165/102 mmHg HR 62 bpm  Repeated standing after 2 min: 158/102 mmHg  Pt reports he has not been taking his BP med, has not taken it in 3 months.  PT advised pt to follow physiican instruction to take his BP. Provides education on signs/symptoms to watch for and when to seek emergency care (worsening headache, vertigo/vision change, increased unsteadiness, new n/t). PT also advises pt to continue to monitor BP at home. Pt reports BP when taken at home is typically lower than BP #s here today. Comments: somewhat lightehaded throughout, slight headache (instructed to monitor at home, if worsens seek emergency care)                                                                                                                             TREATMENT DATE: 11/23/2023   Reviewed HEP with patient as follows: Upper trap stretch - pt reports performing this throughout day, no questions Seated horizontal head turns, vestibular habituation  Reports still symptomatic Demonstrates adequately quick head turns Walking horizontal head turns Reports still feels a little symptoms with this Seated horizontal VOR X1 Educated pt on performing one set with target close and another  with target far Educated pt  on wearing his glasses during this exercise and ensuring keeping target in focus Standing NBOS horizontal head turns x10reps Demos slight postural sway Reports unsteadiness, but not truly dizzy Standing VOR x1 with normal BOS X10 reps  Reports no dizziness  Educated pt to continue performing gaze stabilization and vestibular habituation exercises in sitting as this continues to be when he is the most symptomatic, compared to standing.  Gait training on treadmill for vestibular habituation as follows:  For at 3. using intermittent UE support for safety totaling 0.2mi while performing alternating R/L horizontal head turns to visually locate targets at variable heights Resting HR to start in standing: 74bpm HR increased to 90bpm during gait, and may have decreased to 55bpm (but believe it was not getting accurate reading) Pt reports he felt so, so with dizziness rated as 2-3/10 HR recovers to 80bpm 2. for 5 minutes with 3 times dual-tasks including: Self-Tossing a ball Tapping 2 Blaze Pods placed on the treadmill at random delay time 1-10 seconds with 2 colors (Red = Right hand and blue = Left hand) R/L head turns to locate cards  Reports dizziness still ~2-3/10    PATIENT EDUCATION: Education details: see above, exam findings, additionally, plan, goals Person educated: Patient Education method: Explanation, demo, printout Education comprehension: verbalized understanding  HOME EXERCISE PROGRAM:  Access Code: [email or web address] URL: [email or web address] Date: 11/09/2023 Prepared by: Connell Kiss  Exercises - Seated Upper Trapezius Stretch  - 1 x daily - 7 x weekly - 2 sets - 1 reps - 30 seconds hold - Vestibular Habituation - Seated Horizontal Head Rotation  - 1 x daily - 7 x weekly - 2 sets - 10 reps - Walking with Head Rotation  - 1 x daily - 7 x weekly - 2 sets - 10 reps - Seated Cervical Extension AROM  - 1 x daily - 7 x  weekly - 2 sets - 10 reps - Seated Gaze Stabilization with Head Rotation  - 3 x daily - 7 x weekly - 2 sets - 15 reps - Seated Gaze Stabilization with Head Nod  - 3 x daily - 7 x weekly - 2 sets - 15 reps - Narrow Standing with Counter Support  - 1 x daily - 7 x weekly - 2 sets - 30 seconds hold   GOALS: Goals reviewed with patient? Yes initiated  SHORT TERM GOALS: Target date: 12/21/2023   Patient will be independent in home exercise program to improve balance, dizziness and mobility for better functional independence with ADLs. Baseline:initiated  11/09/2023: reports more consistent compliance this week, updated Goal status: ON GOING   LONG TERM GOALS: Target date:  02/01/2024    1.  Patient will reduce dizziness handicap inventory score by 10 points for less dizziness with ADLs and increased safety with home and work tasks.  Baseline: 18 11/09/2023: 10/100 Goal status: IN PROGRESS  2.  Pt will demonstrate ability to complete WNL cervical flexion AROM without posterior neck pain for improved mobility and QOL  Baseline: 60 deg and pain-limited,felt posterior neck 11/09/2023: 55 deg and pain free Goal status: MET  3.  Patient will increase dynamic gait index score to >19/24 as to demonstrate reduced fall risk and improved dynamic gait balance for better safety with community/home ambulation.  Baseline: 11/09/2023: 22/24 Goal status: MET  3.  Patient will demo and report ability to complete quick horizontal head movements without dizziness for improved ease with driving and scanning with gait.  Baseline: quick head turns trigger  pt's symptoms 11/09/2023: still provokes symptoms, but pt states they are less severe than at initial eval (rate symptoms as 3-/4/10 now)  Goal status: IN PROGRESS    ASSESSMENT:  CLINICAL IMPRESSION:  Therapy session focused on continued dynamic gait training with head turning to visually locate targets with progression to addition of 2 additional  dual-task challenges to promote head and eye movements to stimulate the vestibular system for overall habituation. Patient will also benefit from continued progression of standing balance interventions with eyes closed given hx of neuropathy. Patient reports his HEP is still provoking symptoms as appropriate for habituation and he is compliant. The pt will benefit from further skilled PT to improve dizziness and neck pain in order to increase QOL and ease with mobility/work demands.    OBJECTIVE IMPAIRMENTS: decreased balance, decreased mobility, decreased ROM, dizziness, impaired sensation, postural dysfunction, and pain.   ACTIVITY LIMITATIONS: transfers, bed mobility, and locomotion level  PARTICIPATION LIMITATIONS: driving, community activity, and occupation  PERSONAL FACTORS: Profession, Time since onset of injury/illness/exacerbation, and 3+ comorbidities: Per chart bradycardia, CPAP, HTN, HLD, neuromuscular disorder, sleep apnea, generalized sensorimotor polyneuropathy. are also affecting patient's functional outcome.   REHAB POTENTIAL: Good  CLINICAL DECISION MAKING: Evolving/moderate complexity  EVALUATION COMPLEXITY: Moderate   PLAN:  PT FREQUENCY: every other week due to pt's work schedule  PT DURATION: 12 weeks  PLANNED INTERVENTIONS: [id number]- PT Re-evaluation, 97750- Physical Performance Testing, 97110-Therapeutic exercises, 97530- Therapeutic activity, [id number]- Neuromuscular re-education, 97535- Self Care, [id number]- Manual therapy, [id number]- Gait training, [phone]- Orthotic Initial, [phone]- Orthotic/Prosthetic subsequent, [phone]- Canalith repositioning, Patient/Family education, Balance training, Stair training, Taping, Dry Needling, Joint mobilization, Spinal mobilization, Vestibular training, DME instructions, Cryotherapy, and Moist heat  PLAN FOR NEXT SESSION:  - repeat oculomotor testing & perform VOR testing - perform motion sensitivity quotient - continue treadmill gait training  with horizontal and vertical head turns  - continue balance interventions with eyes closed      Dulse Rutan, PT, DPT, NCS, CSRS Physical Therapist - [place]  [facility]  3:34 PM 11/23/23

## 2023-11-24 NOTE — Telephone Encounter (Signed)
 He has had w/up - including ENT evaluation, etc. I am ok with placing an order for a referral to cardiology. Please confirm which cardiologist he prefers to see. Per note, no new symptoms. Can also schedule a f/u with me if needed to reevaluated as well (if desires).

## 2023-11-24 NOTE — Telephone Encounter (Signed)
 Called patient to clarify what is going on. He is not having any new acute symptoms. Dizziness has been going on. Therapy is not really helping. PT recommended that he be referred to a cardiologist just for further work up. Patient confirmed he is not having chest pain, palpitations, sob, syncopal episodes, etc. He says he feels fine. Are you ok with placing referral or would you like to see him first and discuss?

## 2023-11-25 NOTE — Telephone Encounter (Unsigned)
 Copied from CRM #[id number]. Topic: Referral - Request for Referral >> Nov 23, 2023  3:39 PM Mercedes MATSU wrote: Did the patient discuss referral with their provider in the last year? No (If No - schedule appointment) (If Yes - send message)  Appointment offered? Yes  Type of order/referral and detailed reason for visit: [facility] said patient should see Cardiologist due to patient getting dizzy he went to the eye doctor and his vision was fine.  Preference of office, provider, location: Whomever with [facility]  recommends  If referral order, have you been seen by this specialty before? Yes, several years ago previous stress test (If Yes, this issue or another issue? When? Where?  Can we respond through MyChart? No, patient requesting call back at [phone] >> Nov 25, 2023 10:30 AM [place] J wrote: Patient returning a call from [place]. He stated that he does not have a preference in what cardiology clinic he would like to go to.

## 2023-11-25 NOTE — Telephone Encounter (Signed)
 LMTCB. I have placed referral to cardiology. [facility] in [place]. If he would like to see Dr Glendia as well, please schedule.

## 2023-11-25 NOTE — Addendum Note (Signed)
 Addended by: LEARTA PORTO D on: 11/25/2023 12:15 PM   Modules accepted: Orders

## 2023-11-25 NOTE — Telephone Encounter (Signed)
 Noted

## 2023-11-25 NOTE — Telephone Encounter (Unsigned)
 Copied from CRM [id number]. Topic: General - Other >> Nov 25, 2023 12:36 PM Franky GRADE wrote: Reason for CRM: Patient returning a call from [place], advised of the note that was left and informed patient the referral to Cardiology was placed. Provided patient the phone number to schedule and offered an appointment with Dr.Scott he declined the appointment and will call to schedule the appointment with the Cardiologist.

## 2023-12-07 ENCOUNTER — Ambulatory Visit: Admitting: Physical Therapy

## 2023-12-14 ENCOUNTER — Ambulatory Visit

## 2023-12-21 ENCOUNTER — Ambulatory Visit: Admitting: Physical Therapy

## 2023-12-28 ENCOUNTER — Ambulatory Visit: Attending: Neurology

## 2023-12-28 ENCOUNTER — Ambulatory Visit

## 2023-12-28 DIAGNOSIS — R262 Difficulty in walking, not elsewhere classified: Secondary | ICD-10-CM | POA: Diagnosis present

## 2023-12-28 DIAGNOSIS — M542 Cervicalgia: Secondary | ICD-10-CM | POA: Diagnosis present

## 2023-12-28 DIAGNOSIS — R2681 Unsteadiness on feet: Secondary | ICD-10-CM | POA: Insufficient documentation

## 2023-12-28 DIAGNOSIS — R42 Dizziness and giddiness: Secondary | ICD-10-CM | POA: Insufficient documentation

## 2023-12-28 NOTE — Therapy (Signed)
 OUTPATIENT PHYSICAL THERAPY TREATMENT  Patient Name: Clifford Alexander MRN: [id number] DOB:1965/06/07, 58 y.o., male Today's Date: 12/28/2023  END OF SESSION:   PT End of Session - 12/28/23 1534     Visit Number 8    Number of Visits 49    Date for Recertification  02/01/24    PT Start Time 1530    PT Stop Time 1610    PT Time Calculation (min) 40 min    Equipment Utilized During Treatment Gait belt    Activity Tolerance Patient tolerated treatment well    Behavior During Therapy WFL for tasks assessed/performed          Past Medical History:  Diagnosis Date   Bradycardia    CPAP (continuous positive airway pressure) dependence    Fatigue 06/23/2008   Hyperlipidemia    Hypertension    Insomnia 04/19/2007   Neuromuscular disorder (HCC)    neuropathy in feet   Shingles 01/2016   Sleep apnea    Thrombocytopenia    Past Surgical History:  Procedure Laterality Date   LUMBAR LAMINECTOMY  04/22/2007   L4-L5   discectomy 03/2008   Patient Active Problem List   Diagnosis Date Noted   Elevated PSA 04/06/2023   Right hip pain 02/27/2023   Viral illness 06/15/2022   BPH (benign prostatic hyperplasia) 02/10/2022   Vision changes 02/02/2022   Knee pain, left 09/30/2021   Light headedness 02/26/2021   Neck pain 02/25/2021   Hernia, inguinal 02/26/2020   Thrombocytopenia 10/09/2018   Renal cyst 10/09/2018   OSA (obstructive sleep apnea) 09/26/2017   Headache 09/26/2017   Polyneuropathy 07/18/2017   Abdominal pain 07/13/2017   CTS (carpal tunnel syndrome) 02/05/2017   Numbness in feet 10/27/2016   Vitamin D  deficiency 10/27/2016   Health care maintenance 11/19/2015   Acute bilateral low back pain without sciatica 07/18/2015   History of hay fever 10/23/2014   GERD (gastroesophageal reflux disease) 10/23/2014   Cephalalgia 10/23/2014   Dysmetabolic syndrome 10/23/2014   Hyperglycemia 10/23/2014   Hyperlipidemia 10/23/2014   External thrombosed hemorrhoids 04/06/2008    Combined fat and carbohydrate induced hyperlipemia 02/14/2008   Cannot sleep 04/19/2007   Benign essential HTN 08/05/2006    PCP:  Glendia Shad, MD  REFERRING PROVIDER: Evern Allyson Hacker, FNP   REFERRING DIAG:  Diagnosis  M54.2 (ICD-10-CM) - Cervicalgia  R42 (ICD-10-CM) - Dizziness    THERAPY DIAG:  Unsteadiness on feet  Dizziness and giddiness  Cervicalgia  Difficulty in walking, not elsewhere classified  ONSET DATE: onset 2022  Rationale for Evaluation and Treatment: Rehabilitation  SUBJECTIVE:   SUBJECTIVE STATEMENT: Neck ha sbeen pain free. Pt has bene working on his exercises on the road. Mostly stable symptomwise, with  a few brief exacerbations. Pt FU with cardiology next week for BP rx.   PERTINENT HISTORY:  The pt is a pleasant 58 y/o male referred to PT for dizziness and cervicalgia. Per pt and referral he was seen by ENT 1-2 years ago wit negative work-up: V[facility] and [employer] [place] testing.  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 He reports turning his head while driving is bothersome/can cause his symptoms.  Pt reports onset of dizzy symptoms in 2022. One morning he got up and felt dizzy, says he felt unstable but no spinning. He reports he has never had spinning. He also describes dizziness as lightheaded. He notices when he gets up quickly this triggers his symptoms. This also happens when he  stands up quickly, especially after sitting for along time. Dizziness lasts for a few seconds. He reports his symptoms are mild. Symptoms have remained the same since this started. Pt thinks prostate medication might be affecting him as e reports a side effect is dizziness. He notices he is unbalanced with his eyes closed. He has not fallen yet/no falls in the last six months. Pt reports he cannot get cervical MRI per his insurance until he tries PT. Pt reports he has not taken BP medication in about three months; says he is supposed to take  this 1x/daily. PT instructs pt to follow his physician's instructions/prescription for taking his BP meds.   Pt reports no ringing in his ears, no ear fullness, no light or sound sensitivity, no car sickness, no issues with screens or busy stores. He does report moving his head side-to -side makes him lightheaded. Tilting head up or down does not bring on symptoms.   bradycardia, CPAP, HTN, HLD, neuromuscular disorder, sleep apnea, generalized sensorimotor polyneuropathy.  PAIN:  Are you having pain? No  PRECAUTIONS: None  WEIGHT BEARING RESTRICTIONS: No  FALLS: Has patient fallen in last 6 months? No  PLOF: Independent  PATIENT GOALS: Do away with the dizziness  OBJECTIVE:  Note: Objective measures were completed at Evaluation unless otherwise noted.  DIAGNOSTIC FINDINGS: via chart  02/25/21 DG Cervical spine  FINDINGS: Cervical spine is visualized from the skull base through the cervicothoracic junction. Prevertebral soft tissues are within normal limits. Uncovertebral disease is most evident at C4-5 and C5-6 with loss of disc height at both of these levels. Uncovertebral disease is worse on the right. No acute or healing fractures are present. The lung apices are clear.   IMPRESSION: 1. No acute abnormality. 2. Uncovertebral disease and loss of disc height is greatest at C4-5 and C5-6.    Electronically Signed   By: Lonni Necessary M.D.   On: 02/25/2021 09:19   ASSESSMENT:  OCULOMOTOR EXAM:  Ocular Alignment: normal  Ocular ROM: No Limitations - but slight dizziness at extremes with testing   Spontaneous Nystagmus: absent  Gaze-Induced Nystagmus: absent  End-Gaze nystagmus: possibly present and likey WNL for age, does make pt dizzy with R end gaze   Smooth Pursuits: saccades likely WNL for age   Saccades: appears to have corrective saccades bilat - reports faint dizziness with testing, may benefit from repeat testing future visit   Convergence/Divergence:  WNL movement - reports slight dizziness  Comments: Pt does report this is same kind of dizziness he feels with sit>stand, described as a lightheaded sensation  VESTIBULAR - OCULAR REFLEX: deferred  Slow VOR:   VOR Cancellation:   Head-Impulse Test:   Dynamic Visual Acuity:    POSITIONAL TESTING: deferred  OTHOSTATICS:  Supine: 153/88 mmHg HR 56 bpm  Seated: 164/93 mmHg HR 55 Reports some dizziness  Seated: 165/102 mmHg HR 62 bpm  Repeated standing after 2 min: 158/102 mmHg  Pt reports he has not been taking his BP med, has not taken it in 3 months.  PT advised pt to follow physiican instruction to take his BP. Provides education on signs/symptoms to watch for and when to seek emergency care (worsening headache, vertigo/vision change, increased unsteadiness, new n/t). PT also advises pt to continue to monitor BP at home. Pt reports BP when taken at home is typically lower than BP #s here today. Comments: somewhat lightehaded throughout, slight headache (instructed to monitor at home, if worsens seek emergency care)  TREATMENT DATE: 12/28/2023 -HEP updates based on last 6 weeks of self performance. -Neck pain goals met weeks back, pt sel fDC cervical ROM exercises -updated VOR training to 5x30sec of each (no issues with nods today, but mild symptoms with shakes)  -SLS balance, eyes closed balance, tandem balance: (doe swell in session with these, added to handout)  -DGI survey 4%.    PATIENT EDUCATION: Education details: see above, exam findings, additionally, plan, goals Person educated: Patient Education method: Explanation, demo, printout Education comprehension: verbalized understanding  HOME EXERCISE PROGRAM: Access Code: [email or web address] URL: [email or web address] Date: 12/28/2023 Prepared by: Peggye Linear  Exercises - Walking with Head  Rotation  - 1 x daily - 7 x weekly - 2 sets - 10 reps - Seated Gaze Stabilization with Head Rotation  - 1 x daily - 5-7 x weekly - 3-5 sets - 30sec hold - Seated Gaze Stabilization with Head Nod  - 1 x daily - 5-7 x weekly - 3-5 sets - 30sec hold - Standing Tandem Balance with Counter Support  - 3-4 x weekly - 1 sets - 5 reps - 10sec hold - Single Leg Stance  - 3-4 x weekly - 1 sets - 10 reps - Standing Balance in Corner with Eyes Closed  - 3-4 x weekly - 10 reps - 5-10sec hold   GOALS: Goals reviewed with patient? Yes initiated  SHORT TERM GOALS: Target date: 12/21/2023  Patient will be independent in home exercise program to improve balance, dizziness and mobility for better functional independence with ADLs. Baseline:initiated  11/09/2023: reports more consistent compliance this week, updated Goal status: ON GOING  LONG TERM GOALS: Target date:  02/01/2024  1.  Patient will reduce dizziness handicap inventory score by 10 points for less dizziness with ADLs and increased safety with home and work tasks.  Baseline: 18% 11/09/2023: 10/100; 12/28/23: 18% Goal status: IN PROGRESS  2.  Pt will demonstrate ability to complete WNL cervical flexion AROM without posterior neck pain for improved mobility and QOL  Baseline: 60 deg and pain-limited,felt posterior neck 11/09/2023: 55 deg and pain free; 12/28/23: still pain free  Goal status: MET  3.  Patient will increase dynamic gait index score to >19/24 as to demonstrate reduced fall risk and improved dynamic gait balance for better safety with community/home ambulation.  Baseline: 11/09/2023: 22/24 Goal status: MET  3.  Patient will demo and report ability to complete quick horizontal head movements without dizziness for improved ease with driving and scanning with gait.  Baseline: quick head turns trigger pt's symptoms 11/09/2023: still provokes symptoms, but pt states they are less severe than at initial eval (rate symptoms as 3-/4/10  now)  Goal status: IN PROGRESS   ASSESSMENT:  CLINICAL IMPRESSION:   HEP updated. DGI updayted. Pt doing very well. Anticipate ready for DC at next FU visit. Patient reports his HEP is still provoking symptoms as appropriate for habituation and he is compliant. The pt will benefit from further skilled PT to improve dizziness and neck pain in order to increase QOL and ease with mobility/work demands.    OBJECTIVE IMPAIRMENTS: decreased balance, decreased mobility, decreased ROM, dizziness, impaired sensation, postural dysfunction, and pain.   ACTIVITY LIMITATIONS: transfers, bed mobility, and locomotion level  PARTICIPATION LIMITATIONS: driving, community activity, and occupation  PERSONAL FACTORS: Profession, Time since onset of injury/illness/exacerbation, and 3+ comorbidities: Per chart bradycardia, CPAP, HTN, HLD, neuromuscular disorder, sleep apnea, generalized sensorimotor polyneuropathy. are also affecting patient's functional outcome.   REHAB POTENTIAL: Good  CLINICAL  DECISION MAKING: Evolving/moderate complexity  EVALUATION COMPLEXITY: Moderate   PLAN:  PT FREQUENCY: every other week due to pt's work schedule  PT DURATION: 12 weeks  PLANNED INTERVENTIONS: [id number]- PT Re-evaluation, 97750- Physical Performance Testing, 97110-Therapeutic exercises, 97530- Therapeutic activity, [id number]- Neuromuscular re-education, 97535- Self Care, [id number]- Manual therapy, [phone]- Gait training, [phone]- Orthotic Initial, [phone]- Orthotic/Prosthetic subsequent, [phone]- Canalith repositioning, Patient/Family education, Balance training, Stair training, Taping, Dry Needling, Joint mobilization, Spinal mobilization, Vestibular training, DME instructions, Cryotherapy, and Moist heat  PLAN FOR NEXT SESSION:  - repeat oculomotor testing & perform VOR testing - perform motion sensitivity quotient - continue treadmill gait training with horizontal and vertical head turns  - continue balance interventions with  eyes closed  3:37 PM, 12/28/23 Peggye JAYSON Linear, PT, DPT Physical Therapist - [facility]  Outpatient Physical Therapy- [facility] [phone]

## 2024-01-04 ENCOUNTER — Ambulatory Visit: Admitting: Physical Therapy

## 2024-01-11 ENCOUNTER — Ambulatory Visit

## 2024-01-18 ENCOUNTER — Ambulatory Visit: Admitting: Physical Therapy

## 2024-01-25 ENCOUNTER — Ambulatory Visit

## 2024-01-25 ENCOUNTER — Ambulatory Visit: Admitting: Cardiovascular Disease

## 2024-02-08 NOTE — Telephone Encounter (Signed)
 This encounter was created in error - please disregard.

## 2024-02-22 ENCOUNTER — Other Ambulatory Visit: Payer: Self-pay

## 2024-02-22 ENCOUNTER — Other Ambulatory Visit

## 2024-02-22 DIAGNOSIS — R972 Elevated prostate specific antigen [PSA]: Secondary | ICD-10-CM

## 2024-02-23 LAB — PSA: Prostate Specific Ag, Serum: 0.8 ng/mL (ref 0.0–4.0)

## 2024-03-07 ENCOUNTER — Encounter: Payer: Self-pay | Admitting: Physician Assistant

## 2024-03-07 ENCOUNTER — Ambulatory Visit: Admitting: Physician Assistant
